# Patient Record
Sex: Female | Born: 1975 | Hispanic: No | Marital: Married | Smoking: Never smoker
Health system: Southern US, Community
[De-identification: ages and names within clinical notes are randomized; demographics above are authoritative.]

## PROBLEM LIST (undated history)

## (undated) ENCOUNTER — Inpatient Hospital Stay (HOSPITAL_COMMUNITY): Payer: Self-pay

## (undated) DIAGNOSIS — N970 Female infertility associated with anovulation: Secondary | ICD-10-CM

## (undated) DIAGNOSIS — K219 Gastro-esophageal reflux disease without esophagitis: Secondary | ICD-10-CM

## (undated) DIAGNOSIS — N926 Irregular menstruation, unspecified: Secondary | ICD-10-CM

## (undated) DIAGNOSIS — O021 Missed abortion: Secondary | ICD-10-CM

## (undated) DIAGNOSIS — Z8619 Personal history of other infectious and parasitic diseases: Secondary | ICD-10-CM

## (undated) DIAGNOSIS — N839 Noninflammatory disorder of ovary, fallopian tube and broad ligament, unspecified: Secondary | ICD-10-CM

## (undated) HISTORY — PX: CYSTECTOMY: SUR359

## (undated) HISTORY — DX: Irregular menstruation, unspecified: N92.6

## (undated) HISTORY — DX: Noninflammatory disorder of ovary, fallopian tube and broad ligament, unspecified: N83.9

## (undated) HISTORY — DX: Female infertility associated with anovulation: N97.0

## (undated) HISTORY — PX: APPENDECTOMY: SHX54

## (undated) HISTORY — DX: Personal history of other infectious and parasitic diseases: Z86.19

---

## 2010-04-11 ENCOUNTER — Emergency Department (HOSPITAL_COMMUNITY): Admission: EM | Admit: 2010-04-11 | Discharge: 2010-04-11 | Payer: Self-pay | Admitting: Emergency Medicine

## 2010-04-29 ENCOUNTER — Ambulatory Visit: Payer: Self-pay | Admitting: Internal Medicine

## 2010-04-30 LAB — CONVERTED CEMR LAB
ALT: 9 units/L (ref 0–35)
AST: 14 units/L (ref 0–37)
Alkaline Phosphatase: 52 units/L (ref 39–117)
BUN: 13 mg/dL (ref 6–23)
Basophils Relative: 0.3 % (ref 0.0–3.0)
CO2: 31 meq/L (ref 19–32)
Calcium: 8.7 mg/dL (ref 8.4–10.5)
Chloride: 106 meq/L (ref 96–112)
Creatinine, Ser: 0.4 mg/dL (ref 0.4–1.2)
Eosinophils Absolute: 0 10*3/uL (ref 0.0–0.7)
HDL: 44.1 mg/dL (ref >39.00)
Leukocytes, UA: NEGATIVE
Lymphocytes Relative: 31.6 % (ref 12.0–46.0)
Monocytes Absolute: 0.5 10*3/uL (ref 0.1–1.0)
Monocytes Relative: 5.5 % (ref 3.0–12.0)
Nitrite: NEGATIVE
RDW: 14.9 % — ABNORMAL HIGH (ref 11.5–14.6)
Specific Gravity, Urine: >=1.03 (ref 1.000–1.030)
Total Bilirubin: 0.6 mg/dL (ref 0.3–1.2)
Total Protein, Urine: NEGATIVE mg/dL
Total Protein: 6.7 g/dL (ref 6.0–8.3)
Urine Glucose: NEGATIVE mg/dL
WBC: 8.6 10*3/uL (ref 4.5–10.5)

## 2010-07-07 ENCOUNTER — Ambulatory Visit (HOSPITAL_COMMUNITY): Admission: RE | Admit: 2010-07-07 | Discharge: 2010-07-07 | Payer: Self-pay | Admitting: Obstetrics and Gynecology

## 2010-07-08 ENCOUNTER — Encounter: Admission: RE | Admit: 2010-07-08 | Discharge: 2010-07-08 | Payer: Self-pay | Admitting: Obstetrics and Gynecology

## 2010-07-09 ENCOUNTER — Ambulatory Visit: Payer: Self-pay | Admitting: Internal Medicine

## 2010-12-29 DIAGNOSIS — O021 Missed abortion: Secondary | ICD-10-CM | POA: Insufficient documentation

## 2010-12-29 HISTORY — DX: Missed abortion: O02.1

## 2011-01-27 NOTE — Assessment & Plan Note (Signed)
Summary: DR AVP PT/NO CLINIC-FACIAL PAIN--STC   Vital Signs:  Patient profile:   35 year old female Height:      61 inches (154.94 cm) Weight:      96.0 pounds (43.64 kg) O2 Sat:      99 % on Room air Temp:     98.7 degrees F (37.06 degrees C) oral Pulse rate:   82 / minute BP sitting:   102 / 72  (left arm) Cuff size:   regular  Vitals Entered By: Orlan Leavens (July 09, 2010 4:00 PM)  O2 Flow:  Room air CC: (L) side facial pain Is Patient Diabetic? No Pain Assessment Patient in pain? yes     Location: (L) side face Type: aching Comments Husband states went to urgent care yesterday for ? fever blister on (L) side lip. was rx Zovirax 5% oint and Valtrex. Today been gaving facial pain (L) side lip now swollen   Primary Care Provider:  Georgina Quint Plotnikov MD  CC:  (L) side facial pain.  History of Present Illness: here with left side lip and face pain - onset 48h ago - seen yesterday& dx with cold sores by urg care  started on cream ointment + 1 pill for same (valtrex x 2 dose) - continued pain and swelling of upper lip - feels nausea with meds - no fevr - no other ulcers or skin lesions +hx cold sores 4 years ago but only lasted 24h - denies physical or emotional stress  Current Medications (verified): 1)  Vitamin D 1000 Unit Tabs (Cholecalciferol) .Marland Kitchen.. 1 By Mouth Qd 2)  Folic Acid 400 Mcg Tabs (Folic Acid) .Marland Kitchen.. 1 By Mouth Qd 3)  Zovirax 5 % Oint (Acyclovir) .... Apply Two Times A Day Prn 4)  Valtrex 1 Gm Tabs (Valacyclovir Hcl) .... Take 1 By Mouth Once Daily  Allergies (verified): No Known Drug Allergies  Past History:  Past Medical History: Palpitations Dr Allyson Sabal   Review of Systems  The patient denies fever, weight loss, chest pain, and syncope.    Physical Exam  General:  thin woman, nonenglish speaking - accompanied by spouse - nontoxic Eyes:  vision grossly intact; pupils equal, round and reactive to light.  conjunctiva and lids normal.    Ears:   normal pinnae bilaterally, without erythema, swelling, or tenderness to palpation. TMs clear, without effusion, or cerumen impaction. Hearing grossly normal bilaterally  Mouth:  +herpetic outbreak and swelling across left side of upper lip; gums and OP WNL Lungs:  normal respiratory effort, no intercostal retractions or use of accessory muscles; normal breath sounds bilaterally - no crackles and no wheezes.    Heart:  normal rate, regular rhythm, no murmur, and no rub. BLE without edema.   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 1 by mouth qd 2)  Folic Acid 400 Mcg Tabs (Folic acid) .Marland Kitchen.. 1 by mouth qd 3)  Zovirax 5 % Oint (Acyclovir) .... Apply two times a day prn 4)  Valtrex 1 Gm Tabs (Valacyclovir hcl) .... Take 1 by mouth once daily x 7 days 5)  Lyrica 25 Mg Caps (Pregabalin) .Marland Kitchen.. 1 by mouth three times a day as needed for pain x 7 days 6)  Promethazine Hcl 12.5 Mg Tabs (Promethazine hcl) .Marland Kitchen.. 1 by mouth every 4 hours as needed for nausea 7)  Lidocaine Hcl 2 % Gel (Lidocaine hcl) .... Apply to affected lip pain three times a day as needed for pain  Patient Instructions: 1)  it was good to see you today. 2)  continue the valtrex once daily x 7 days 3)  use lyrica as needed for pain 4)  use promethazine as needed for nausea 5)  use lidocaine gel as needed to numb the lip pain 6)  ok to continue the acyclovir ointment for the infection as per [facility] instructions - no change 7)  also ok to use tylenol as needed for comfort 8)  Please schedule a follow-up appointment  as needed. Prescriptions: LIDOCAINE HCL 2 % GEL (LIDOCAINE HCL) apply to affected lip pain three times a day as needed for pain  #1 small x 0   Entered and Authorized by:   Newt Lukes MD   Signed by:   Newt Lukes MD on 07/09/2010   Method used:   Print then Give to Patient   RxID:   [id number] PROMETHAZINE HCL 12.5 MG TABS (PROMETHAZINE HCL) 1 by mouth every 4 hours as needed for nausea  #20 x 0   Entered and Authorized by:   Newt Lukes MD   Signed by:   Newt Lukes MD on 07/09/2010   Method used:   Print then Give to Patient   RxID:   [id number] LYRICA 25 MG CAPS (PREGABALIN) 1 by mouth three times a day as needed for pain x 7 days  #21 x 0   Entered and Authorized by:   Newt Lukes MD   Signed by:   Newt Lukes MD on 07/09/2010   Method used:   Print then Give to Patient   RxID:   [id number] VALTREX 1 GM TABS (VALACYCLOVIR HCL) take 1 by mouth once daily x 7 days  #7 x 0   Entered and Authorized by:   Newt Lukes MD   Signed by:   Newt Lukes MD on 07/09/2010   Method used:   Print then Give to Patient   RxID:   [id number]

## 2011-01-27 NOTE — Assessment & Plan Note (Signed)
Summary: NEW / [employer]/ #/ CD    Vital Signs:  Patient profile:   35 year old female Height:      61 inches Weight:      93 pounds (42.27 kg) BMI:     17.64 O2 Sat:      98 % on Room air Temp:     98.8 degrees F (37.11 degrees C) oral Pulse rate:   81 / minute Pulse rhythm:   regular BP sitting:   118 / 78  (left arm) Cuff size:   regular  Vitals Entered By: Brenton Grills (Apr 29, 2010 3:09 PM)  O2 Flow:  Room air CC: new pt here to establish care/aj   CC:  new pt here to establish care/aj.  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 Never Smoked Alcohol use-no Regular exercise-no Smoking Status:  never Does Patient Exercise:  no  Review of Systems  The patient denies anorexia, fever, weight loss, weight gain, vision loss, decreased hearing, hoarseness, chest pain, syncope, dyspnea on exertion, peripheral edema, prolonged cough, headaches, hemoptysis, abdominal pain, melena, hematochezia, severe indigestion/heartburn, hematuria, incontinence, genital sores, muscle weakness, suspicious skin lesions, transient blindness, difficulty walking, depression, unusual weight change, abnormal bleeding, enlarged lymph nodes, angioedema, and breast masses.    Physical Exam  General:  Well-developed,well-nourished,in no acute distress; alert,appropriate and cooperative throughout examination Head:  Normocephalic and atraumatic without obvious abnormalities. No apparent alopecia or balding. Eyes:  No corneal  or conjunctival inflammation noted. EOMI. Perrla. Funduscopic exam benign, without hemorrhages, exudates or papilledema. Vision grossly normal. Ears:  External ear exam shows no significant lesions or deformities.  Otoscopic examination reveals clear canals, tympanic membranes are intact bilaterally without bulging, retraction, inflammation or discharge. Hearing is grossly normal bilaterally. Nose:  External nasal examination shows no deformity or inflammation. Nasal mucosa are pink and moist without lesions or exudates. Mouth:  Oral mucosa and oropharynx without lesions or exudates.  Teeth in good repair. Neck:  No deformities, masses, or tenderness noted. Lungs:  Normal respiratory effort, chest expands symmetrically. Lungs are clear to auscultation, no crackles or wheezes. Heart:  Normal rate and regular rhythm. S1 and S2 normal without gallop, murmur, click, rub or other extra sounds. Abdomen:  Bowel sounds positive,abdomen soft and non-tender without masses, organomegaly or hernias noted. Msk:  No deformity or scoliosis noted of thoracic or lumbar spine.   Pulses:  R and L carotid,radial,femoral,dorsalis pedis and posterior tibial pulses are full and equal bilaterally Extremities:  No clubbing, cyanosis, edema, or deformity noted with normal full range of motion of all joints.   Neurologic:  No cranial nerve deficits noted. Station and gait are normal. Plantar reflexes are down-going bilaterally. DTRs are symmetrical throughout. Sensory, motor and coordinative functions appear intact. Skin:  Intact without suspicious lesions or rashes Cervical Nodes:  No lymphadenopathy noted Inguinal Nodes:  No significant adenopathy Psych:  Cognition and judgment appear intact. Alert and cooperative with normal attention span and concentration. No apparent delusions, illusions, hallucinations   Impression & Recommendations:  Problem #  1:  PHYSICAL EXAMINATION (ICD-V70.0) Assessment New Health and age  related issues were discussed. Available screening tests and vaccinations were discussed as well. Healthy life style including good diet and execise was discussed.  Orders: Gynecologic Referral (Gyn) TLB-B12, Serum-Total ONLY (45409-W11) TLB-BMP (Basic Metabolic Panel-BMET) (80048-METABOL) TLB-CBC Platelet - w/Differential (85025-CBCD) TLB-Hepatic/Liver Function Pnl (80076-HEPATIC) TLB-Lipid Panel (80061-LIPID) TLB-TSH (Thyroid Stimulating Hormone) (84443-TSH) TLB-Udip ONLY (81003-UDIP)  Problem # 2:  PALPITATIONS (ICD-785.1) Assessment: New She is wearing a monitor. F/u w/Dr Allyson Sabal is pending  Orders: TLB-B12, Serum-Total ONLY (91478-G95) TLB-BMP (Basic Metabolic Panel-BMET) (80048-METABOL) TLB-CBC Platelet - w/Differential (85025-CBCD) TLB-Hepatic/Liver Function Pnl (80076-HEPATIC) TLB-Lipid Panel (80061-LIPID) TLB-TSH (Thyroid Stimulating Hormone) (84443-TSH) TLB-Udip ONLY (81003-UDIP)  Complete Medication List: 1)  Vitamin D 1000 Unit Tabs (Cholecalciferol) .Marland Kitchen.. 1 by mouth qd 2)  Folic Acid 400 Mcg Tabs (Folic acid) .Marland Kitchen.. 1 by mouth qd  Patient Instructions: 1)  Please schedule a follow-up appointment in 1 year. Prescriptions: FOLIC ACID 400 MCG TABS (FOLIC ACID) 1 by mouth qd  #621 x 0   Entered and Authorized by:   Tresa Garter MD   Signed by:   Tresa Garter MD on 05/03/2010   Method used:   Print then Give to Patient   RxID:   [id number]

## 2011-03-17 LAB — DIFFERENTIAL
Basophils Relative: 0 % (ref 0–1)
Lymphocytes Relative: 32 % (ref 12–46)
Monocytes Absolute: 0.5 10*3/uL (ref 0.1–1.0)
Neutro Abs: 5.1 10*3/uL (ref 1.7–7.7)
Neutrophils Relative %: 61 % (ref 43–77)

## 2011-03-17 LAB — POCT CARDIAC MARKERS: CKMB, poc: <1 ng/mL — ABNORMAL LOW (ref 1.0–8.0)

## 2011-03-17 LAB — CBC
HCT: 39.5 % (ref 36.0–46.0)
Platelets: 180 10*3/uL (ref 150–400)
RDW: 14.7 % (ref 11.5–15.5)

## 2011-03-17 LAB — COMPREHENSIVE METABOLIC PANEL
AST: 15 U/L (ref 0–37)
CO2: 27 mEq/L (ref 19–32)
Calcium: 8.7 mg/dL (ref 8.4–10.5)
Chloride: 107 mEq/L (ref 96–112)
GFR calc Af Amer: >60 mL/min (ref >60)
Potassium: 4 mEq/L (ref 3.5–5.1)
Total Protein: 6.8 g/dL (ref 6.0–8.3)

## 2011-05-21 DIAGNOSIS — N839 Noninflammatory disorder of ovary, fallopian tube and broad ligament, unspecified: Secondary | ICD-10-CM

## 2011-05-21 HISTORY — DX: Noninflammatory disorder of ovary, fallopian tube and broad ligament, unspecified: N83.9

## 2012-03-18 ENCOUNTER — Ambulatory Visit (INDEPENDENT_AMBULATORY_CARE_PROVIDER_SITE_OTHER): Payer: 59 | Admitting: Internal Medicine

## 2012-03-18 ENCOUNTER — Encounter: Payer: Self-pay | Admitting: Internal Medicine

## 2012-03-18 ENCOUNTER — Other Ambulatory Visit (INDEPENDENT_AMBULATORY_CARE_PROVIDER_SITE_OTHER): Payer: 59

## 2012-03-18 VITALS — BP 110/70 | HR 88 | Temp 98.5°F | Resp 16 | Wt 105.0 lb

## 2012-03-18 DIAGNOSIS — R202 Paresthesia of skin: Secondary | ICD-10-CM

## 2012-03-18 DIAGNOSIS — E559 Vitamin D deficiency, unspecified: Secondary | ICD-10-CM

## 2012-03-18 DIAGNOSIS — R209 Unspecified disturbances of skin sensation: Secondary | ICD-10-CM

## 2012-03-18 DIAGNOSIS — R109 Unspecified abdominal pain: Secondary | ICD-10-CM

## 2012-03-18 DIAGNOSIS — M545 Low back pain: Secondary | ICD-10-CM

## 2012-03-18 DIAGNOSIS — N926 Irregular menstruation, unspecified: Secondary | ICD-10-CM

## 2012-03-18 LAB — COMPREHENSIVE METABOLIC PANEL
Albumin: 3.8 g/dL (ref 3.5–5.2)
BUN: 13 mg/dL (ref 6–23)
CO2: 30 mEq/L (ref 19–32)
Chloride: 100 mEq/L (ref 96–112)
GFR: 186.34 mL/min (ref >60.00)
Glucose, Bld: 86 mg/dL (ref 70–99)
Potassium: 3.6 mEq/L (ref 3.5–5.1)
Sodium: 137 mEq/L (ref 135–145)

## 2012-03-18 LAB — TSH: TSH: 1.36 u[IU]/mL (ref 0.35–5.50)

## 2012-03-18 LAB — CBC WITH DIFFERENTIAL/PLATELET
HCT: 37.6 % (ref 36.0–46.0)
Hemoglobin: 12.4 g/dL (ref 12.0–15.0)
Monocytes Absolute: 0.4 10*3/uL (ref 0.1–1.0)
Neutro Abs: 5 10*3/uL (ref 1.4–7.7)
Neutrophils Relative %: 60.9 % (ref 43.0–77.0)
Platelets: 244 10*3/uL (ref 150.0–400.0)
RDW: 13.7 % (ref 11.5–14.6)
WBC: 8.1 10*3/uL (ref 4.5–10.5)

## 2012-03-18 MED ORDER — IBUPROFEN 600 MG PO TABS: ORAL_TABLET | ORAL | Status: AC

## 2012-03-18 MED ORDER — PRENATAL RX 60-1 MG PO TABS: 1.0000 | ORAL_TABLET | Freq: Every day | ORAL | Status: DC

## 2012-03-18 NOTE — Progress Notes (Signed)
  Subjective:    Patient ID: Gabriela Le, female    DOB: [birth year], 36 y.o.   MRN: [id number]  HPI  C/o LBP irrad in B legs, chronic - worse with weather changes C/o mild stiffness LMP 10 d ago - came early  Review of Systems  Constitutional: Negative for fever, chills, activity change, appetite change, fatigue and unexpected weight change.  HENT: Negative for congestion, mouth sores and sinus pressure.   Eyes: Negative for visual disturbance.  Respiratory: Negative for cough and chest tightness.   Gastrointestinal: Negative for nausea and abdominal pain.  Genitourinary: Negative for frequency, difficulty urinating and vaginal pain.  Musculoskeletal: Positive for back pain and arthralgias. Negative for gait problem.  Skin: Negative for pallor and rash.  Neurological: Negative for dizziness, tremors, weakness, numbness and headaches.  Hematological: Does not bruise/bleed easily.  Psychiatric/Behavioral: Negative for suicidal ideas, confusion and sleep disturbance. The patient is not nervous/anxious.        Objective:   Physical Exam  Constitutional: She appears well-developed and well-nourished. No distress.  HENT:  Head: Normocephalic.  Right Ear: External ear normal.  Left Ear: External ear normal.  Nose: Nose normal.  Mouth/Throat: Oropharynx is clear and moist.  Eyes: Conjunctivae are normal. Pupils are equal, round, and reactive to light. Right eye exhibits no discharge. Left eye exhibits no discharge.  Neck: Normal range of motion. Neck supple. No JVD present. No tracheal deviation present. No thyromegaly present.  Cardiovascular: Normal rate, regular rhythm and normal heart sounds.   Pulmonary/Chest: No stridor. No respiratory distress. She has no wheezes.  Abdominal: Soft. Bowel sounds are normal. She exhibits no distension and no mass. There is no tenderness. There is no rebound and no guarding.  Musculoskeletal: She exhibits no edema and no tenderness.    Lymphadenopathy:    She has no cervical adenopathy.  Neurological: She displays normal reflexes. No cranial nerve deficit. She exhibits normal muscle tone. Coordination normal.  Skin: No rash noted. She is not diaphoretic. No erythema.  Psychiatric: She has a normal mood and affect. Her behavior is normal. Judgment and thought content normal.    Lab Results  Component Value Date   WBC 8.1 03/18/2012   HGB 12.4 03/18/2012   HCT 37.6 03/18/2012   PLT 244.0 03/18/2012   GLUCOSE 86 03/18/2012   CHOL 169 04/29/2010   TRIG 163.0* 04/29/2010   HDL 44.10 04/29/2010   LDLCALC 92 04/29/2010   ALT 8 03/18/2012   AST 13 03/18/2012   NA 137 03/18/2012   K 3.6 03/18/2012   CL 100 03/18/2012   CREATININE 0.4 03/18/2012   BUN 13 03/18/2012   CO2 30 03/18/2012   TSH 1.36 03/18/2012         Assessment & Plan:

## 2012-03-19 ENCOUNTER — Encounter: Payer: Self-pay | Admitting: Internal Medicine

## 2012-03-19 ENCOUNTER — Telehealth: Payer: Self-pay | Admitting: Internal Medicine

## 2012-03-19 LAB — RHEUMATOID FACTOR: Rhuematoid fact SerPl-aCnc: <10 IU/mL (ref <=14)

## 2012-03-19 MED ORDER — ERGOCALCIFEROL 1.25 MG (50000 UT) PO CAPS: 50000.0000 [IU] | ORAL_CAPSULE | ORAL | Status: DC

## 2012-03-19 MED ORDER — VITAMIN D 1000 UNITS PO TABS: 1000.0000 [IU] | ORAL_TABLET | Freq: Every day | ORAL | Status: DC

## 2012-03-19 NOTE — Assessment & Plan Note (Signed)
Check B12 

## 2012-03-19 NOTE — Assessment & Plan Note (Signed)
Check ser pregn test

## 2012-03-19 NOTE — Assessment & Plan Note (Signed)
Start on Rx 

## 2012-03-19 NOTE — Assessment & Plan Note (Signed)
Labs Xray if not pregnant

## 2012-03-19 NOTE — Telephone Encounter (Signed)
Gabriela Le, please, inform patient that all labs are normal except for low vit D Start Vit D Rx, then OTC tabs Pregn test was negative Thx

## 2012-03-21 LAB — ANA: Anti Nuclear Antibody(ANA): NEGATIVE

## 2012-03-21 NOTE — Telephone Encounter (Signed)
Left mess for patient to call back.  

## 2012-03-29 NOTE — Telephone Encounter (Signed)
Pt's husband Lazarus Gowda informed all labs are normal except Vit d is low.

## 2012-04-12 ENCOUNTER — Encounter: Payer: Self-pay | Admitting: Obstetrics and Gynecology

## 2012-04-12 ENCOUNTER — Ambulatory Visit (INDEPENDENT_AMBULATORY_CARE_PROVIDER_SITE_OTHER): Payer: 59 | Admitting: Obstetrics and Gynecology

## 2012-04-12 VITALS — BP 90/66 | Resp 16 | Ht 62.0 in | Wt 105.0 lb

## 2012-04-12 DIAGNOSIS — N971 Female infertility of tubal origin: Secondary | ICD-10-CM

## 2012-04-12 DIAGNOSIS — Z01419 Encounter for gynecological examination (general) (routine) without abnormal findings: Secondary | ICD-10-CM

## 2012-04-12 DIAGNOSIS — N970 Female infertility associated with anovulation: Secondary | ICD-10-CM

## 2012-04-12 DIAGNOSIS — N979 Female infertility, unspecified: Secondary | ICD-10-CM

## 2012-04-12 NOTE — Progress Notes (Deleted)
Subjective:    Gabriela Le is a 36 y.o. female No obstetric history on file. who presents for evaluation of infertility. Patient and partner have been attempting conception since {MONTH:22386} 20***. Partner: *** is *** years old {does/does not:200015} have children. Couple has been attempting conception since {MONTH:22386} 20***. Pregnancies with current partner: {yes/no:311178::"***"}.  Menstrual history:   LMP:  Patient's last menstrual period was 04/06/2012. Menarche:  *** years old Menstrual cycle:  {Misc; menses description:16152} Pelvic pain: {diagnoses; pelvic pain:12438}  Endocrine history:   Basal body temperature Biphasic {yes/no:311178}  TSH {Desc; normal/abnormal w/wildcard:19060} {MONTH:22386} 20***  Prolactin {Desc; normal/abnormal w/wildcard:19060} {MONTH:22386} 20***  FSH {Desc; normal/abnormal w/wildcard:19060} {MONTH:22386} 20***  Day 21 progesterone ***  {MONTH:22386} 20***  Hysterosalpyngogram {Desc; normal/abnormal w/wildcard:19060} {MONTH:22386} 20***  Glucose:Insulin ratio {Desc; normal/abnormal w/wildcard:19060} {MONTH:22386} 20***  Semen analysis {Desc; normal/abnormal w/wildcard:19060}{MONTH:22386} 20***  Medications {meds; infertility:14695}  Other therapies {infertility art procedures:14668}  Insemination {response; UUV:25366}   Obstetrical history:   {infertility hx:14818}  Contraception history:   {infertility contraceptive types:14820}   Habits:   Patient reports:  History   Social History  . Marital Status: Married    Spouse Name: N/A    Number of Children: N/A  . Years of Education: N/A   Social History Main Topics  . Smoking status: Never Smoker   . Smokeless tobacco: Never Used  . Alcohol Use: No  . Drug Use: No  . Sexually Active: Yes   Other Topics Concern  . None   Social History Narrative  . None   Partner reports: {findings; intake alcohol/tobacco/caffeine:30392}  The following portions of the patient's history  were reviewed and updated as appropriate: allergies, current medications, past family history, past medical history, past social history, past surgical history and problem list.  Review of Systems {ros; complete:30496}   Objective:     BP 90/66  Resp 16  Ht 5\' 2"  (1.575 m)  Wt 105 lb (47.628 kg)  BMI 19.20 kg/m2  LMP 04/06/2012   Wt Readings from Last 1 Encounters:  04/12/12 105 lb (47.628 kg)    BMI: Body mass index is 19.20 kg/(m^2). General Appearance: Alert, appropriate appearance for age. No acute distress HEENT: Grossly normal Neck / Thyroid: Supple, no masses, nodes or enlargement Lungs: clear to auscultation bilaterally Back: No CVA tenderness Breast Exam: {exam; breast:30839::"No masses or nodes.No dimpling, nipple retraction or discharge."} Cardiovascular: Regular rate and rhythm. S1, S2, no murmur Gastrointestinal: Soft, non-tender, no masses or organomegaly Pelvic Exam: External genitalia: female circumcision and REMOVAL OF MOST OF LABIA Urinary system: meatus covered by circumcison closure Vaginal: {vaginal exam:30846} Cervix: {cervix exam:30847} Adnexa: {pelvic adnexa exam:30848} Uterus: retroverted and with decreased mobility Rectal: good sphincter tone and no masses  Rectovaginal: {rectal female:311646::"not indicated"} Lymphatic Exam: Non-palpable nodes in neck, clavicular, axillary, or inguinal regions Skin: no rash or abnormalities Neurologic: Normal gait and speech, no tremor  Psychiatric: Alert and oriented, appropriate affect.    Assessment:    {repro-endo rfv:14670::"Female infertility"} Suspected / known {infertility:18127}.   Plan:   Tests ordered:   ***  Treatment:   {meds; infertility:14695} {With-without:32421} hCG  {With-without:32421} follicle study Follow-up:  {follow up:15908}  Mckinzy Fuller PMD 4/16/20134:25 PM

## 2012-04-13 LAB — PAP IG AND HPV HIGH-RISK

## 2012-04-13 NOTE — Progress Notes (Signed)
Subjective:    Gabriela Le is a 36 y.o. female, No obstetric history on file., who presents for an annual exam. And with the continued complaint of infertility.  The patient has a HSG consistent with left tubal occlusion.  Her menses has changed to span only a single day of bleeding     History   Social History  . Marital Status: Married    Spouse Name: N/A    Number of Children: N/A  . Years of Education: N/A   Social History Main Topics  . Smoking status: Never Smoker   . Smokeless tobacco: Never Used  . Alcohol Use: No  . Drug Use: No  . Sexually Active: Yes   Other Topics Concern  . None   Social History Narrative  . None    Menstrual cycle:   LMP: Patient's last menstrual period was 04/06/2012.           Cycle: Monthly  The following portions of the patient's history were reviewed and updated as appropriate: allergies, current medications, past family history, past medical history, past social history, past surgical history and problem list.  Review of Systems Pertinent items are noted in HPI. Breast:Negative for breast lump,nipple discharge or nipple retraction Gastrointestinal: Negative for abdominal pain, change in bowel habits or rectal bleeding Urinary:negative   Objective:    BP 90/66  Resp 16  Ht 5\' 2"  (1.575 m)  Wt 105 lb (47.628 kg)  BMI 19.20 kg/m2  LMP 04/06/2012    Weight:  Wt Readings from Last 1 Encounters:  04/12/12 105 lb (47.628 kg)          BMI: Body mass index is 19.20 kg/(m^2).  General Appearance: Alert, appropriate appearance for age. No acute distress HEENT: Grossly normal Neck / Thyroid: Supple, no masses, nodes or enlargement Lungs: clear to auscultation bilaterally Back: No CVA tenderness Breast Exam: No masses or nodes.No dimpling, nipple retraction or discharge. Cardiovascular: Regular rate and rhythm. S1, S2, no murmur Gastrointestinal: Soft, non-tender, no masses or organomegaly Pelvic Exam: External genitalia: female  circumcision Urinary system: urethral meatus obscured by circumcision Vaginal: normal mucosa without prolapse or lesions Cervix: normal appearance Adnexa: normal bimanual exam Uterus: nl sized, mobile and nontender Rectovaginal: normal rectal, no masses Lymphatic Exam: Non-palpable nodes in neck, clavicular, axillary, or inguinal regions Skin: no rash or abnormalities Neurologic: Normal gait and speech, no tremor  Psychiatric: Alert and oriented, appropriate affect.   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 April Manson STD screening: done previously Contraception:none, trying to conceive FU PRN      Alexander Mcauley PMD

## 2012-04-15 ENCOUNTER — Other Ambulatory Visit: Payer: Self-pay

## 2012-04-15 DIAGNOSIS — N979 Female infertility, unspecified: Secondary | ICD-10-CM

## 2012-04-18 ENCOUNTER — Telehealth: Payer: Self-pay

## 2012-04-18 NOTE — Telephone Encounter (Signed)
LM ON VM TO CB PER REFERRAL APPT.

## 2012-04-19 NOTE — Telephone Encounter (Signed)
PC TO PT RGDG REFERRAL. SPOKE WIT PT'S HUSBAND. APPT SCHED 06/06/12@ 10:30A WITH DR.YALCINKAYA. PT'S HUSBAND VOICES UNDERSTANDING.

## 2012-05-02 ENCOUNTER — Ambulatory Visit: Payer: 59 | Admitting: Internal Medicine

## 2012-05-13 ENCOUNTER — Telehealth: Payer: Self-pay | Admitting: Obstetrics and Gynecology

## 2012-05-13 NOTE — Telephone Encounter (Signed)
TC to pt. Spoke with husband. States had +UPT a few days ago.  LMP 04/06/12. Will notify Dr [facility] and call with recommendations.  Husband is agreeable.

## 2012-05-13 NOTE — Telephone Encounter (Signed)
Message copied by Mason Jim on Fri May 13, 2012  3:19 PM ------      Message from: ELDER, [place] M      Created: Fri May 13, 2012  1:18 PM      Regarding: positive preg test      Contact: [phone]       She was told to call when pregnancy positive, per chandra placing calling in triage pool. pe

## 2012-05-16 ENCOUNTER — Other Ambulatory Visit: Payer: 59

## 2012-05-16 DIAGNOSIS — N979 Female infertility, unspecified: Secondary | ICD-10-CM

## 2012-05-16 MED ORDER — PROMETHAZINE HCL 25 MG PO TABS: 25.0000 mg | ORAL_TABLET | Freq: Four times a day (QID) | ORAL | Status: DC | PRN

## 2012-05-17 LAB — HCG, QUANTITATIVE, PREGNANCY: hCG, Beta Chain, Quant, S: 110173.2 m[IU]/mL

## 2012-05-18 ENCOUNTER — Other Ambulatory Visit: Payer: 59

## 2012-05-18 DIAGNOSIS — N979 Female infertility, unspecified: Secondary | ICD-10-CM

## 2012-05-18 LAB — HCG, QUANTITATIVE, PREGNANCY: hCG, Beta Chain, Quant, S: 142883 m[IU]/mL

## 2012-05-26 ENCOUNTER — Ambulatory Visit (INDEPENDENT_AMBULATORY_CARE_PROVIDER_SITE_OTHER): Payer: 59

## 2012-05-26 ENCOUNTER — Other Ambulatory Visit: Payer: Self-pay | Admitting: Obstetrics and Gynecology

## 2012-05-26 ENCOUNTER — Ambulatory Visit (INDEPENDENT_AMBULATORY_CARE_PROVIDER_SITE_OTHER): Payer: 59 | Admitting: Obstetrics and Gynecology

## 2012-05-26 ENCOUNTER — Encounter: Payer: Self-pay | Admitting: Obstetrics and Gynecology

## 2012-05-26 VITALS — BP 102/62 | Ht 62.0 in | Wt 101.0 lb

## 2012-05-26 DIAGNOSIS — N979 Female infertility, unspecified: Secondary | ICD-10-CM

## 2012-05-26 DIAGNOSIS — K117 Disturbances of salivary secretion: Secondary | ICD-10-CM

## 2012-05-26 DIAGNOSIS — O210 Mild hyperemesis gravidarum: Secondary | ICD-10-CM

## 2012-05-26 DIAGNOSIS — Z8742 Personal history of other diseases of the female genital tract: Secondary | ICD-10-CM

## 2012-05-26 DIAGNOSIS — O219 Vomiting of pregnancy, unspecified: Secondary | ICD-10-CM

## 2012-05-26 DIAGNOSIS — Z8759 Personal history of other complications of pregnancy, childbirth and the puerperium: Secondary | ICD-10-CM

## 2012-05-26 DIAGNOSIS — B009 Herpesviral infection, unspecified: Secondary | ICD-10-CM

## 2012-05-26 LAB — US OB TRANSVAGINAL

## 2012-05-26 MED ORDER — ONDANSETRON HCL 8 MG PO TABS: 8.0000 mg | ORAL_TABLET | Freq: Three times a day (TID) | ORAL | Status: AC | PRN

## 2012-05-26 MED ORDER — PROGESTERONE MICRONIZED 100 MG PO CAPS: ORAL_CAPSULE | ORAL | Status: DC

## 2012-05-26 MED ORDER — POLYETHYLENE GLYCOL 3350 17 G PO PACK: 17.0000 g | PACK | Freq: Every day | ORAL | Status: AC

## 2012-05-26 MED ORDER — ACYCLOVIR 5 % EX OINT: TOPICAL_OINTMENT | CUTANEOUS | Status: DC

## 2012-05-26 MED ORDER — DICYCLOMINE HCL 10 MG PO CAPS: 10.0000 mg | ORAL_CAPSULE | Freq: Three times a day (TID) | ORAL | Status: DC

## 2012-05-26 NOTE — Progress Notes (Signed)
Pt c/o pain in the top of her stomach. Also c/o bump on her lip, lots of nausea and a knot under her chin.  [place]; Ultrasound:  fetal cardiac activity, CRL: 1.52 cm, EGA:7 weeks +6 days, S=D C/o: 1) Nausea and vomiting.  4 llb wt loss since visit in April.  Also c/o ptyalism   Recommend: Bentyl     Zofran and Miralax (already has constipation)  2) Hx SAB.    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

## 2012-05-26 NOTE — Telephone Encounter (Signed)
Gabriela Le/epic 

## 2012-05-27 NOTE — Telephone Encounter (Signed)
Pc to pharm per telephone call. Pharm states,"no questions at this time. Figured out what needed to be done yesterday". Pharm agrees.

## 2012-06-03 ENCOUNTER — Telehealth: Payer: Self-pay | Admitting: Obstetrics and Gynecology

## 2012-06-03 NOTE — Telephone Encounter (Signed)
Kendal Hymen speaking with pt

## 2012-06-03 NOTE — Telephone Encounter (Signed)
Triage/epic 

## 2012-06-03 NOTE — Telephone Encounter (Signed)
vph pt 

## 2012-06-03 NOTE — Telephone Encounter (Signed)
Spoke with py's husband rgd his wife still feeling sick with zofran 8 mg tabs and penergan25mg  . Per vl told me to offer pt phenergan 25 mg supp.or come to [place]. Pt did not answer the phone called 3 times . Lm on vm and told pt i would call back. If no answer next time i call i will advise pt to call midwife oncall

## 2012-06-03 NOTE — Telephone Encounter (Signed)
Lm on vm to cb per telephone call.  

## 2012-06-09 ENCOUNTER — Telehealth: Payer: Self-pay | Admitting: Obstetrics and Gynecology

## 2012-06-09 NOTE — Telephone Encounter (Signed)
Left msg  For a f/u on pt's voice mail to call back rgd our conversation from the other day.

## 2012-06-09 NOTE — Telephone Encounter (Signed)
Tyvonna/return call 

## 2012-06-10 ENCOUNTER — Telehealth: Payer: Self-pay | Admitting: Obstetrics and Gynecology

## 2012-06-10 NOTE — Telephone Encounter (Signed)
Left 2 nd msg on pt's voice mail trying to do a f/u on pt rgd msg from last week. bt cma

## 2012-06-10 NOTE — Telephone Encounter (Signed)
Left 2nd msg on pt's voice mail.

## 2012-06-10 NOTE — Telephone Encounter (Signed)
Spoke with pt's husband today rgd F/u from last week . Told pt's husband tried to cb last Friday rgd msg. Pt's husband stated that she is still taking her phenergan 25 mg not the zofran 8 mg no more. Pt's husband said that his wife is still feeling nausea advised husband for wife to eat small portions of food, and pt can eat some crackers.told pt's husband to see how his wife feels over the weekend and to call me back next week as needed the patient's husband also asked about prenatal vitamins i told pt's husband she will get some samples at her nob work-up on 06/20/2012. Pt's husband voice understanding . BTCMA

## 2012-06-10 NOTE — Telephone Encounter (Signed)
Spoke with pt's husband today he stated that he is still working to call him back @ 3:00 btcma

## 2012-06-20 ENCOUNTER — Ambulatory Visit (INDEPENDENT_AMBULATORY_CARE_PROVIDER_SITE_OTHER): Payer: 59 | Admitting: Obstetrics and Gynecology

## 2012-06-20 VITALS — Wt 100.0 lb

## 2012-06-20 DIAGNOSIS — Z331 Pregnant state, incidental: Secondary | ICD-10-CM

## 2012-06-20 DIAGNOSIS — Z3201 Encounter for pregnancy test, result positive: Secondary | ICD-10-CM

## 2012-06-20 LAB — POCT URINALYSIS DIPSTICK
Bilirubin, UA: NEGATIVE
Blood, UA: 2
Nitrite, UA: NEGATIVE
Protein, UA: NEGATIVE
Urobilinogen, UA: NEGATIVE
pH, UA: 6

## 2012-06-20 NOTE — Progress Notes (Signed)
PT REQUESTS WT CHECK TODAY.

## 2012-06-21 LAB — CULTURE, OB URINE
Colony Count: NO GROWTH
Organism ID, Bacteria: NO GROWTH

## 2012-06-21 LAB — PRENATAL PANEL VII
Basophils Absolute: 0 10*3/uL (ref 0.0–0.1)
HIV: NONREACTIVE
Hepatitis B Surface Ag: NEGATIVE
Lymphocytes Relative: 25 % (ref 12–46)
Lymphs Abs: 2.5 10*3/uL (ref 0.7–4.0)
MCV: 88.8 fL (ref 78.0–100.0)
Neutro Abs: 6.7 10*3/uL (ref 1.7–7.7)
Platelets: 227 10*3/uL (ref 150–400)
RBC: 3.85 MIL/uL — ABNORMAL LOW (ref 3.87–5.11)
RDW: 14.4 % (ref 11.5–15.5)
Rubella: >500 IU/mL — ABNORMAL HIGH
WBC: 9.7 10*3/uL (ref 4.0–10.5)

## 2012-06-22 LAB — HEMOGLOBINOPATHY EVALUATION
Hemoglobin Other: 0 %
Hgb F Quant: 0 % (ref 0.0–2.0)
Hgb S Quant: 0 %

## 2012-07-14 ENCOUNTER — Ambulatory Visit (INDEPENDENT_AMBULATORY_CARE_PROVIDER_SITE_OTHER): Payer: 59 | Admitting: Obstetrics and Gynecology

## 2012-07-14 ENCOUNTER — Encounter: Payer: Self-pay | Admitting: Obstetrics and Gynecology

## 2012-07-14 VITALS — BP 90/58 | Wt 101.0 lb

## 2012-07-14 DIAGNOSIS — K219 Gastro-esophageal reflux disease without esophagitis: Secondary | ICD-10-CM

## 2012-07-14 DIAGNOSIS — N90810 Female genital mutilation status, unspecified: Secondary | ICD-10-CM

## 2012-07-14 DIAGNOSIS — Z331 Pregnant state, incidental: Secondary | ICD-10-CM

## 2012-07-14 MED ORDER — OMEPRAZOLE 20 MG PO CPDR: 20.0000 mg | DELAYED_RELEASE_CAPSULE | Freq: Every day | ORAL | Status: DC

## 2012-07-14 NOTE — Progress Notes (Signed)
[redacted]w[redacted]d Subjective:    Gabriela Le is being seen today for her first obstetrical visit at [redacted]w[redacted]d gestation by42w1d   .  She reports nausea associated with acid reflux after eating. Also reports constipation.  Her obstetrical history is significant for: hx of SAB 2011 at approx 6 weeks. Passed POC - no D&E Patient Active Problem List  Diagnosis  . HERPES LABIALIS  . PALPITATIONS  . Low back pain radiating to both legs  . Irregular periods  . Paresthesia  . Abdominal  pain, other specified site  . Vitamin d deficiency  . 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 Infertility, female  . HSV-1 infection  . Nausea and vomiting in pregnancy prior to [redacted] weeks gestation  . Ptyalism    Relationship with FOB: married , stable relationship.  Patient  intends to breast feed.   Pregnancy history fully reviewed. Medical Hx: Hx of palpations when she arrived in [place] 3 years ago. Attributed to anxiety. Had them investigated - resolved spontaneously.  Review of Systems Pertinent ROS is described in HPI   Objective:   BP 90/58  Wt 101 lb (45.813 kg)  LMP 04/06/2012 Wt Readings from Last 1 Encounters:  07/14/12 101 lb (45.813 kg)   BMI: 18.47  General: alert, cooperative and no distress Respiratory: clear to auscultation bilaterally Cardiovascular: regular rate and rhythm, S1, S2 normal, no murmur Breasts:  No dominant masses, nipples erect Gastrointestinal: soft, non-tender; no masses,  no organomegaly, complains of constipation, and acid reflux  (Will prescribe for these conditions)  Extremities: extremities normal, no pain or edema Vaginal Bleeding: None  EXTERNAL GENITALIA: FEMALE CIRCUMCISION,no tenderness or lesions VAGINA: no abnormal discharge or lesions CERVIX: no lesions or cervical motion tenderness; cervix closed, long, firm UTERUS: gravid and consistent with 101w1d weeks ADNEXA: no masses palpable and nontender OB EXAM PELVIMETRY: adequate   FHR:  135bpm  Assessment:    Pregnancy at  [redacted]w[redacted]d Female Circumcision C/o constipation - Tx Colace C/o Acid Reflux - Tx Ranitidine    Plan:    Prenatal panel: done today 07/14/12 Pap smear collected:donetoday 07/14/12 (with difficulty due to female circ) GC/Chlamydia collected:done today 07/14/12 Wet prep: PH 4.5 neg Discussion of Genetic testing options: [facility] today 07/14/12 Prenatal vitamins recommended - taking at present Problem list reviewed and updated.  Plan of care: Follow up in 4 weeks for ROB. Anatomy USS at 19 weeks  Acid Reflux: Ranitidine 20mg s po daily Constipation: Colace 100mg s po daily   Earl Gala CNM, [place] 07/14/2012 3:27 PM

## 2012-07-14 NOTE — Progress Notes (Deleted)
Pt experiencing constipation

## 2012-07-15 LAB — PRENATAL PANEL VII
Antibody Screen: NEGATIVE
Eosinophils Relative: 0 % (ref 0–5)
Hemoglobin: 11.9 g/dL — ABNORMAL LOW (ref 12.0–15.0)
Hepatitis B Surface Ag: NEGATIVE
Lymphocytes Relative: 23 % (ref 12–46)
Lymphs Abs: 2.3 10*3/uL (ref 0.7–4.0)
MCV: 90.8 fL (ref 78.0–100.0)
Monocytes Relative: 4 % (ref 3–12)
Platelets: 212 10*3/uL (ref 150–400)
RBC: 3.82 MIL/uL — ABNORMAL LOW (ref 3.87–5.11)
Rubella: >500 IU/mL — ABNORMAL HIGH
WBC: 10.3 10*3/uL (ref 4.0–10.5)

## 2012-07-18 LAB — HEMOGLOBINOPATHY EVALUATION
Hemoglobin Other: 0 %
Hgb S Quant: 0 %

## 2012-07-18 LAB — PAP IG, CT-NG, RFX HPV ASCU

## 2012-07-19 ENCOUNTER — Encounter: Payer: 59 | Admitting: Obstetrics and Gynecology

## 2012-07-25 ENCOUNTER — Encounter: Payer: 59 | Admitting: Obstetrics and Gynecology

## 2012-08-11 ENCOUNTER — Ambulatory Visit (INDEPENDENT_AMBULATORY_CARE_PROVIDER_SITE_OTHER): Payer: 59 | Admitting: Obstetrics and Gynecology

## 2012-08-11 ENCOUNTER — Encounter: Payer: Self-pay | Admitting: Obstetrics and Gynecology

## 2012-08-11 VITALS — BP 98/52 | Wt 104.0 lb

## 2012-08-11 DIAGNOSIS — O219 Vomiting of pregnancy, unspecified: Secondary | ICD-10-CM

## 2012-08-11 DIAGNOSIS — N90810 Female genital mutilation status, unspecified: Secondary | ICD-10-CM

## 2012-08-11 DIAGNOSIS — O09529 Supervision of elderly multigravida, unspecified trimester: Secondary | ICD-10-CM

## 2012-08-11 DIAGNOSIS — O210 Mild hyperemesis gravidarum: Secondary | ICD-10-CM

## 2012-08-11 DIAGNOSIS — Z3689 Encounter for other specified antenatal screening: Secondary | ICD-10-CM

## 2012-08-11 MED ORDER — VITAFOL-OB PO TABS: 1.0000 | ORAL_TABLET | Freq: Every day | ORAL | Status: DC

## 2012-08-11 NOTE — Progress Notes (Signed)
Routine Prenatal Visit: [redacted]w[redacted]d Pt c/o constipation when using prenatal vitamin.Script for vita-Fol given. Materna 21 =nl Anatomy U/S NV

## 2012-08-22 ENCOUNTER — Encounter: Payer: Self-pay | Admitting: Obstetrics and Gynecology

## 2012-08-22 ENCOUNTER — Ambulatory Visit (INDEPENDENT_AMBULATORY_CARE_PROVIDER_SITE_OTHER): Payer: 59

## 2012-08-22 ENCOUNTER — Ambulatory Visit (INDEPENDENT_AMBULATORY_CARE_PROVIDER_SITE_OTHER): Payer: 59 | Admitting: Obstetrics and Gynecology

## 2012-08-22 VITALS — BP 90/54 | Wt 104.0 lb

## 2012-08-22 DIAGNOSIS — O09529 Supervision of elderly multigravida, unspecified trimester: Secondary | ICD-10-CM

## 2012-08-22 DIAGNOSIS — Z331 Pregnant state, incidental: Secondary | ICD-10-CM

## 2012-08-22 DIAGNOSIS — Z3689 Encounter for other specified antenatal screening: Secondary | ICD-10-CM

## 2012-08-22 NOTE — Progress Notes (Signed)
Sono:  EFW:  12oz   CX: 3.29 cm   vertex presentation, fundal placenta edge is 8.1 cm on ternal os, fluid is normal (vertical pocket = 4.0 cm).   No anomalies seen (profile, palate, philtrum, nasal bone, open hands, 5th digit, heel, feet seen.  Female gender.   Normal ovaries, no fluid in CDS, normal adenexas.

## 2012-08-22 NOTE — Progress Notes (Signed)
No concerns today 

## 2012-08-22 NOTE — Progress Notes (Signed)
[redacted]w[redacted]d Normal 2nd trimester ultrasound. AMA: normal [facility] C/O ptyalism: suggest trial of Bentyl (previously prescribed)

## 2012-08-24 LAB — US OB COMP + 14 WK

## 2012-08-25 ENCOUNTER — Telehealth: Payer: Self-pay | Admitting: Obstetrics and Gynecology

## 2012-08-25 NOTE — Telephone Encounter (Signed)
Notified pts husband that she has refills of Bentyl at the pharmacy.  Verified [facility] [address] as pharmacy.  ld

## 2012-08-25 NOTE — Telephone Encounter (Signed)
Saw SR 08/22/12

## 2012-08-25 NOTE — Telephone Encounter (Signed)
Saw SR 08/22/12 

## 2012-08-25 NOTE — Telephone Encounter (Signed)
TRIAGE/RX FOLLOW UP °

## 2012-08-30 ENCOUNTER — Emergency Department (HOSPITAL_COMMUNITY)
Admission: EM | Admit: 2012-08-30 | Discharge: 2012-08-30 | Disposition: A | Discharge status: Home / Self Care | Payer: 59 | Attending: Emergency Medicine | Admitting: Emergency Medicine

## 2012-08-30 ENCOUNTER — Encounter (HOSPITAL_COMMUNITY): Payer: Self-pay | Admitting: Emergency Medicine

## 2012-08-30 DIAGNOSIS — R109 Unspecified abdominal pain: Secondary | ICD-10-CM | POA: Insufficient documentation

## 2012-08-30 DIAGNOSIS — Z3490 Encounter for supervision of normal pregnancy, unspecified, unspecified trimester: Secondary | ICD-10-CM

## 2012-08-30 DIAGNOSIS — O210 Mild hyperemesis gravidarum: Secondary | ICD-10-CM | POA: Insufficient documentation

## 2012-08-30 DIAGNOSIS — Z9089 Acquired absence of other organs: Secondary | ICD-10-CM | POA: Insufficient documentation

## 2012-08-30 DIAGNOSIS — R112 Nausea with vomiting, unspecified: Secondary | ICD-10-CM

## 2012-08-30 LAB — COMPREHENSIVE METABOLIC PANEL
ALT: 9 U/L (ref 0–35)
Albumin: 2.8 g/dL — ABNORMAL LOW (ref 3.5–5.2)
Alkaline Phosphatase: 57 U/L (ref 39–117)
BUN: 5 mg/dL — ABNORMAL LOW (ref 6–23)
Calcium: 8.7 mg/dL (ref 8.4–10.5)
GFR calc Af Amer: >90 mL/min (ref >90)
Glucose, Bld: 83 mg/dL (ref 70–99)
Potassium: 3.5 mEq/L (ref 3.5–5.1)
Sodium: 134 mEq/L — ABNORMAL LOW (ref 135–145)
Total Protein: 6.4 g/dL (ref 6.0–8.3)

## 2012-08-30 LAB — CBC WITH DIFFERENTIAL/PLATELET
Basophils Relative: 0 % (ref 0–1)
Eosinophils Absolute: 0.1 10*3/uL (ref 0.0–0.7)
Eosinophils Relative: 0 % (ref 0–5)
Lymphs Abs: 2.9 10*3/uL (ref 0.7–4.0)
MCH: 32.1 pg (ref 26.0–34.0)
MCHC: 35 g/dL (ref 30.0–36.0)
MCV: 92 fL (ref 78.0–100.0)
Neutrophils Relative %: 69 % (ref 43–77)
Platelets: 188 10*3/uL (ref 150–400)

## 2012-08-30 LAB — URINE MICROSCOPIC-ADD ON

## 2012-08-30 LAB — URINALYSIS, ROUTINE W REFLEX MICROSCOPIC
Bilirubin Urine: NEGATIVE
Glucose, UA: NEGATIVE mg/dL
Ketones, ur: NEGATIVE mg/dL
Protein, ur: NEGATIVE mg/dL
pH: 7 (ref 5.0–8.0)

## 2012-08-30 MED ORDER — ONDANSETRON HCL 4 MG PO TABS: 4.0000 mg | ORAL_TABLET | Freq: Four times a day (QID) | ORAL | Status: AC

## 2012-08-30 MED ORDER — DICYCLOMINE HCL 20 MG PO TABS: 20.0000 mg | ORAL_TABLET | Freq: Two times a day (BID) | ORAL | Status: DC

## 2012-08-30 MED ORDER — ACETAMINOPHEN 325 MG PO TABS
650.0000 mg | ORAL_TABLET | Freq: Once | ORAL | Status: AC
Start: 2012-08-30 — End: 2012-08-30
  Administered 2012-08-30: 650 mg via ORAL
  Filled 2012-08-30: qty 2

## 2012-08-30 MED ORDER — SODIUM CHLORIDE 0.9 % IV BOLUS (SEPSIS)
1000.0000 mL | Freq: Once | INTRAVENOUS | Status: AC
  Administered 2012-08-30: 1000 mL via INTRAVENOUS

## 2012-08-30 MED ORDER — ONDANSETRON HCL 4 MG/2ML IJ SOLN
4.0000 mg | INTRAMUSCULAR | Status: AC
  Administered 2012-08-30: 4 mg via INTRAVENOUS
  Filled 2012-08-30: qty 2

## 2012-08-30 NOTE — ED Provider Notes (Signed)
History     CSN: [id number]  Arrival date & time 08/30/12  0710   First MD Initiated Contact with Patient 08/30/12 [phone]      Chief Complaint  Patient presents with  . Abdominal Pain    (Consider location/radiation/quality/duration/timing/severity/associated sxs/prior treatment) HPI Comments: Patient is 66 weeks old and presents with mid back pain that radiates around her flanks to her upper abdomen. She reports the pain starting 5 hours ago and has been present ever since. The pain is described as cramping and intermittent. At its worst, the pain is 9/10. She has not taken anything for pain and reports associated nausea and vomiting. Her last OBGYN visit was about 1 week ago where an ultrasound was done and there were no problems. She denies any fever, diarrhea, vaginal bleeding.   Patient is a 36 y.o. female presenting with abdominal pain.  Abdominal Pain The primary symptoms of the illness include abdominal pain, nausea and vomiting. The primary symptoms of the illness do not include fever, fatigue, shortness of breath, diarrhea, dysuria or vaginal bleeding.  Additional symptoms associated with the illness include back pain. Symptoms associated with the illness do not include chills, diaphoresis or hematuria.    Past Medical History  Diagnosis Date  . Irregular menses   . 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 H/O cold sores     ACYCLOVIR OINTMENT PRN  . Infertility associated with anovulation     HAS TAKEN CLOMID IN THE PAST    Past Surgical History  Procedure Date  . Appendectomy     6 YOA    Family History  Problem Relation Age of Onset  . Heart disease Mother     PALPITATIONS  . Hypertension Mother     History  Substance Use Topics  . Smoking status: Never Smoker   . Smokeless tobacco: Never Used  . Alcohol Use: No    OB History    Grav Para Term Preterm Abortions TAB SAB Ect Mult Living   2 0   1 0 1         Review of Systems  Constitutional: Negative for fever,  chills, diaphoresis and fatigue.  Respiratory: Negative for shortness of breath.   Cardiovascular: Negative for chest pain.  Gastrointestinal: Positive for nausea, vomiting and abdominal pain. Negative for diarrhea.  Genitourinary: Negative for dysuria, hematuria, vaginal bleeding and pelvic pain.  Musculoskeletal: Positive for back pain.  Skin: Negative for rash and wound.  Neurological: Negative for dizziness, weakness, numbness and headaches.    Allergies  Review of patient's allergies indicates no known allergies.  Home Medications   Current Outpatient Rx  Name Route Sig Dispense Refill  . DICYCLOMINE HCL 10 MG PO CAPS Oral Take 1 capsule (10 mg total) by mouth 4 (four) times daily -  before meals and at bedtime. 90 capsule 6  . ONDANSETRON HCL 8 MG PO TABS Oral Take 8 mg by mouth every 8 (eight) hours as needed. For nausea    . VITAFOL-OB PO TABS Oral Take 1 tablet by mouth daily. 30 each 12  . PRESCRIPTION MEDICATION Vaginal Place 1 capsule vaginally at bedtime. Compounded Progesterone 100 mg capsule  Place 1 capsule in vagina at bedtime      BP 104/69  Pulse 83  Temp 98.4 F (36.9 C) (Oral)  Resp 16  SpO2 100%  LMP 04/06/2012  Physical Exam  Nursing note and vitals reviewed. Constitutional: She is oriented to person, place, and time. She appears well-developed and well-nourished. No  distress.  HENT:  Head: Normocephalic and atraumatic.  Eyes: Conjunctivae are normal. No scleral icterus.  Neck: Normal range of motion.  Cardiovascular: Normal rate and regular rhythm.  Exam reveals no gallop and no friction rub.   No murmur heard. Pulmonary/Chest: Effort normal and breath sounds normal. No respiratory distress. She has no wheezes. She has no rales. She exhibits no tenderness.  Abdominal: Soft. She exhibits no distension. There is tenderness. There is no rebound and no guarding.       Notable upper abdominal tenderness to palpation.   Musculoskeletal: Normal range of  motion.  Neurological: She is alert and oriented to person, place, and time.  Skin: Skin is warm and dry. She is not diaphoretic.  Psychiatric: She has a normal mood and affect. Her behavior is normal.    ED Course  Procedures (including critical care time)   Labs Reviewed  CBC WITH DIFFERENTIAL  COMPREHENSIVE METABOLIC PANEL  URINALYSIS, ROUTINE W REFLEX MICROSCOPIC  LIPASE, BLOOD   No results found.   No diagnosis found.    MDM  8:10 AM Patient is [redacted] weeks pregnant and having upper abdominal and back pain. I have ordered labs and urinalysis. I will reassess when labs have resulted.   11:57 AM Urinalysis has resulted which shows no infection and trace blood. Patient should follow up with OBGYN. No further evaluation needed at this time. Plan discussed with Dr. Rubin Payor who is agreeable.     Emilia Beck, PA-C 08/30/12 1200

## 2012-08-30 NOTE — ED Provider Notes (Signed)
Medical screening examination/treatment/procedure(s) were conducted as a shared visit with non-physician practitioner(s) and myself.  I personally evaluated the patient during the encounter. Epigastric tenderness. No rebound or guarding. Labs reassuring. She is pregnant. Patient feels somewhat better be discharged home. CholeLithiasis the possibility of an ultrasound was done at this time. She'll followup as needed.  Juliet Rude. Rubin Payor, MD 08/30/12 1610

## 2012-08-30 NOTE — ED Notes (Signed)
Pt states she is having abd pain that started in the kidney area and radiates around to the front to the top of the stomach  Pt had vomiting that started around 0600  Denies any vaginal spotting or bleeding  Pt is [redacted] weeks pregnant

## 2012-09-19 ENCOUNTER — Ambulatory Visit (INDEPENDENT_AMBULATORY_CARE_PROVIDER_SITE_OTHER): Payer: 59 | Admitting: Obstetrics and Gynecology

## 2012-09-19 VITALS — BP 90/60 | Wt 109.0 lb

## 2012-09-19 DIAGNOSIS — O09529 Supervision of elderly multigravida, unspecified trimester: Secondary | ICD-10-CM

## 2012-09-19 DIAGNOSIS — O26849 Uterine size-date discrepancy, unspecified trimester: Secondary | ICD-10-CM

## 2012-09-19 NOTE — Progress Notes (Signed)
[redacted]w[redacted]d  No complaints 7 lb wt gain this pregnancy 1hr glu and U/S for growth at NV

## 2012-10-18 ENCOUNTER — Other Ambulatory Visit: Payer: Self-pay | Admitting: Obstetrics and Gynecology

## 2012-10-18 ENCOUNTER — Encounter: Payer: Self-pay | Admitting: Obstetrics and Gynecology

## 2012-10-18 ENCOUNTER — Other Ambulatory Visit: Payer: 59

## 2012-10-18 ENCOUNTER — Ambulatory Visit (INDEPENDENT_AMBULATORY_CARE_PROVIDER_SITE_OTHER): Payer: 59

## 2012-10-18 ENCOUNTER — Ambulatory Visit (INDEPENDENT_AMBULATORY_CARE_PROVIDER_SITE_OTHER): Payer: 59 | Admitting: Obstetrics and Gynecology

## 2012-10-18 VITALS — BP 98/60 | Wt 115.0 lb

## 2012-10-18 DIAGNOSIS — O26849 Uterine size-date discrepancy, unspecified trimester: Secondary | ICD-10-CM

## 2012-10-18 DIAGNOSIS — Z331 Pregnant state, incidental: Secondary | ICD-10-CM

## 2012-10-18 MED ORDER — RANITIDINE HCL 150 MG PO TABS: ORAL_TABLET | ORAL | Status: DC

## 2012-10-18 NOTE — Progress Notes (Signed)
[redacted]w[redacted]d Glucola Given Pt c/o heartburn unrepsonsive to [place].  rec  Zantac 150 mg hs and am (requested printed script)  Ultrasound shows:  Gestational age by [place]     SIUP  S=D     [place] EDD: 01/07/2013             AFI: 12.9 cm            Cervical length: n/a cm            Placenta localization: posterior           Fetal presentation: breech footling      Anatomy survey is normal Comments: Footling Breech Posterior Placenta AFI is normal ( 35th% )

## 2012-10-18 NOTE — Progress Notes (Signed)
Hx orolabial eruption 1 yr prior to pregnancy, none since.  Will do HSV I and III at next visit to determine need for Valtrex at 34 wks

## 2012-10-19 LAB — RPR

## 2012-10-19 LAB — US OB FOLLOW UP

## 2012-10-19 LAB — GLUCOSE TOLERANCE, 1 HOUR (50G) W/O FASTING: Glucose, 1 Hour GTT: 107 mg/dL (ref 70–140)

## 2012-11-01 ENCOUNTER — Ambulatory Visit (INDEPENDENT_AMBULATORY_CARE_PROVIDER_SITE_OTHER): Payer: 59 | Admitting: Obstetrics and Gynecology

## 2012-11-01 VITALS — BP 98/56 | Wt 116.0 lb

## 2012-11-01 DIAGNOSIS — K1370 Unspecified lesions of oral mucosa: Secondary | ICD-10-CM

## 2012-11-01 DIAGNOSIS — O26849 Uterine size-date discrepancy, unspecified trimester: Secondary | ICD-10-CM

## 2012-11-01 DIAGNOSIS — N9089 Other specified noninflammatory disorders of vulva and perineum: Secondary | ICD-10-CM

## 2012-11-01 DIAGNOSIS — O09529 Supervision of elderly multigravida, unspecified trimester: Secondary | ICD-10-CM

## 2012-11-01 NOTE — Progress Notes (Signed)
[redacted]w[redacted]d 10/18/2012 1 hr GTT = 107  S<D U/S next visit

## 2012-11-02 LAB — HSV 2 ANTIBODY, IGG: HSV 2 Glycoprotein G Ab, IgG: 0.15 IV

## 2012-11-15 ENCOUNTER — Ambulatory Visit (INDEPENDENT_AMBULATORY_CARE_PROVIDER_SITE_OTHER): Payer: 59 | Admitting: Obstetrics and Gynecology

## 2012-11-15 ENCOUNTER — Ambulatory Visit (INDEPENDENT_AMBULATORY_CARE_PROVIDER_SITE_OTHER): Payer: 59

## 2012-11-15 VITALS — BP 98/64 | Wt 117.0 lb

## 2012-11-15 DIAGNOSIS — O26849 Uterine size-date discrepancy, unspecified trimester: Secondary | ICD-10-CM

## 2012-11-15 DIAGNOSIS — O09529 Supervision of elderly multigravida, unspecified trimester: Secondary | ICD-10-CM

## 2012-11-15 DIAGNOSIS — B009 Herpesviral infection, unspecified: Secondary | ICD-10-CM

## 2012-11-15 NOTE — Progress Notes (Signed)
[redacted]w[redacted]d    Ultrasound shows:  EFW 4lbz 0oz  33%        [place] EDD: 01/11/2013            AFI: 11.78cm            Cervical length:  CX Not Measured            Placenta localization: fundal            Fetal presentation: breech Homero Fellers        Comments:AFI normal 30th %  Will repeat u/s at 36 wks if unable to tell baby is vertex HSV I positive.  HSVII neg.  Will start Valtrex at 34 wks

## 2012-11-28 LAB — US OB FOLLOW UP

## 2012-11-30 ENCOUNTER — Ambulatory Visit (INDEPENDENT_AMBULATORY_CARE_PROVIDER_SITE_OTHER): Payer: 59 | Admitting: Obstetrics and Gynecology

## 2012-11-30 VITALS — BP 100/62 | Wt 120.0 lb

## 2012-11-30 DIAGNOSIS — O09529 Supervision of elderly multigravida, unspecified trimester: Secondary | ICD-10-CM

## 2012-11-30 DIAGNOSIS — O26849 Uterine size-date discrepancy, unspecified trimester: Secondary | ICD-10-CM

## 2012-11-30 DIAGNOSIS — O26843 Uterine size-date discrepancy, third trimester: Secondary | ICD-10-CM

## 2012-11-30 NOTE — Progress Notes (Signed)
34 WKS 0 DAYS Pt stated no issues today .  S<D  U/S NV

## 2012-12-15 ENCOUNTER — Ambulatory Visit (INDEPENDENT_AMBULATORY_CARE_PROVIDER_SITE_OTHER): Payer: 59 | Admitting: Obstetrics and Gynecology

## 2012-12-15 ENCOUNTER — Ambulatory Visit (INDEPENDENT_AMBULATORY_CARE_PROVIDER_SITE_OTHER): Payer: 59

## 2012-12-15 VITALS — BP 90/60 | Wt 119.0 lb

## 2012-12-15 DIAGNOSIS — O26843 Uterine size-date discrepancy, third trimester: Secondary | ICD-10-CM

## 2012-12-15 DIAGNOSIS — Z331 Pregnant state, incidental: Secondary | ICD-10-CM

## 2012-12-15 DIAGNOSIS — O09529 Supervision of elderly multigravida, unspecified trimester: Secondary | ICD-10-CM

## 2012-12-15 DIAGNOSIS — O321XX0 Maternal care for breech presentation, not applicable or unspecified: Secondary | ICD-10-CM

## 2012-12-15 DIAGNOSIS — O26849 Uterine size-date discrepancy, unspecified trimester: Secondary | ICD-10-CM

## 2012-12-15 LAB — POCT URINALYSIS DIPSTICK
Bilirubin, UA: NEGATIVE
Glucose, UA: NEGATIVE
Spec Grav, UA: 1.01
pH, UA: 6

## 2012-12-15 NOTE — Progress Notes (Signed)
.   Ultrasound shows:  EFW 6lbs13oz 45%ILE        [place] EDD: 01/11/2013             AFI: 14.53cm            Cervical length: not measured             Placenta localization: posterior fundal            Fetal presentation: breech (frank)    Anatomy survey completed yes    Anatomy survey is normal            Gender : female    Comments:AFI is normal(50th%) Options reviewed for management of breech presentation: Pt wants ECV.  Risks and benefits reviewed.  Scheduled for 12/19/12 at 8 am.  Pt to arrive at 7am GBS done

## 2012-12-16 ENCOUNTER — Telehealth (HOSPITAL_COMMUNITY): Payer: Self-pay | Admitting: *Deleted

## 2012-12-16 ENCOUNTER — Encounter (HOSPITAL_COMMUNITY): Payer: Self-pay | Admitting: *Deleted

## 2012-12-16 ENCOUNTER — Telehealth: Payer: Self-pay | Admitting: Obstetrics and Gynecology

## 2012-12-16 ENCOUNTER — Telehealth: Payer: Self-pay

## 2012-12-16 NOTE — Telephone Encounter (Signed)
Preadmission screen Interpreter number [phone]

## 2012-12-16 NOTE — Telephone Encounter (Signed)
Tc from pt's husband. Informed of version appt. Pt to go into [facility].  Pt's husband agrees and voices understanding.

## 2012-12-16 NOTE — Telephone Encounter (Signed)
External Version scheduled for 12/19/12 @ 8:00 with [facility]. -Adrianne Pridgen

## 2012-12-16 NOTE — Telephone Encounter (Signed)
Lm on vm to cb per version appt sched 12/19/12 @ 8:00a. Pt to arrive @ 7:15a @ [facility].

## 2012-12-16 NOTE — Telephone Encounter (Signed)
Message copied by Raylene Everts on Fri Dec 16, 2012  9:01 AM ------      Message from: Dierdre Forth P      Created: Thu Dec 15, 2012  9:23 PM      Regarding: schedule       Diagnosis:  BREECH       Procedure:  external cephalic version      Time required:        Surgeon: Dr Pennie Rushing      Assistant:CNM            Pre-op needed: no        Post-op visit:none            Anesthesia: none      Date requested:  12/19/12          Location:  [facility]      Patient status: outpatient            Pre-op orders:  Already ordered in Epic:  no         Enter routine orders per Dr Pennie Rushing: yes             Special note: I have scheduled.  Please notify pt            PLEASE SEND iCal ATTACHMENT TO CONFIRM SCHEDULING

## 2012-12-17 LAB — STREP B DNA PROBE: GBSP: NEGATIVE

## 2012-12-19 ENCOUNTER — Encounter (HOSPITAL_COMMUNITY): Payer: 59

## 2012-12-19 ENCOUNTER — Inpatient Hospital Stay (HOSPITAL_COMMUNITY): Payer: 59

## 2012-12-19 LAB — US OB FOLLOW UP

## 2012-12-26 ENCOUNTER — Ambulatory Visit (INDEPENDENT_AMBULATORY_CARE_PROVIDER_SITE_OTHER): Payer: 59 | Admitting: Obstetrics and Gynecology

## 2012-12-26 DIAGNOSIS — O321XX0 Maternal care for breech presentation, not applicable or unspecified: Secondary | ICD-10-CM

## 2012-12-26 DIAGNOSIS — Z331 Pregnant state, incidental: Secondary | ICD-10-CM

## 2012-12-26 DIAGNOSIS — Z3490 Encounter for supervision of normal pregnancy, unspecified, unspecified trimester: Secondary | ICD-10-CM

## 2012-12-27 ENCOUNTER — Encounter: Payer: Self-pay | Admitting: Obstetrics and Gynecology

## 2012-12-27 ENCOUNTER — Encounter: Payer: 59 | Admitting: Obstetrics and Gynecology

## 2012-12-27 ENCOUNTER — Telehealth: Payer: Self-pay | Admitting: Obstetrics and Gynecology

## 2012-12-27 ENCOUNTER — Ambulatory Visit (INDEPENDENT_AMBULATORY_CARE_PROVIDER_SITE_OTHER): Payer: 59

## 2012-12-27 ENCOUNTER — Other Ambulatory Visit: Payer: Self-pay | Admitting: Obstetrics and Gynecology

## 2012-12-27 ENCOUNTER — Ambulatory Visit (INDEPENDENT_AMBULATORY_CARE_PROVIDER_SITE_OTHER): Payer: 59 | Admitting: Obstetrics and Gynecology

## 2012-12-27 VITALS — BP 100/70 | Wt 121.0 lb

## 2012-12-27 DIAGNOSIS — Z3490 Encounter for supervision of normal pregnancy, unspecified, unspecified trimester: Secondary | ICD-10-CM

## 2012-12-27 DIAGNOSIS — O321XX0 Maternal care for breech presentation, not applicable or unspecified: Secondary | ICD-10-CM

## 2012-12-27 DIAGNOSIS — Z331 Pregnant state, incidental: Secondary | ICD-10-CM

## 2012-12-27 LAB — US OB LIMITED

## 2012-12-27 MED ORDER — VALACYCLOVIR HCL 500 MG PO TABS: 500.0000 mg | ORAL_TABLET | Freq: Every day | ORAL | Status: DC

## 2012-12-27 MED ORDER — PANTOPRAZOLE SODIUM 40 MG PO TBEC: 40.0000 mg | DELAYED_RELEASE_TABLET | Freq: Every day | ORAL | Status: DC

## 2012-12-27 NOTE — Progress Notes (Signed)
R/S from yesterday--still breech, with normal fluid. Wants C/S with [facility]--reviewed guidelines for 39 weeks. Needs Rx for Protonix (Zantac not working), and for Valtrex per previous discussion with [facility] regarding cold sores--Rxs sent to pharmacy.

## 2012-12-27 NOTE — Progress Notes (Signed)
[redacted]w[redacted]d Pt canceled external version  Ultrasound shows:  SIUP  S=D     [place] EDD: 01/11/13           AFI: 12.8 cm = 45th%tile           Cervical length: not evaluated            Placenta localization: anterior fundal           Fetal presentation: breech frank

## 2012-12-27 NOTE — Telephone Encounter (Signed)
Primary C/S scheduled for 01/06/13 @ 9:00 with [facility]. -Adrianne Pridgen

## 2012-12-27 NOTE — Progress Notes (Signed)
R/S for 12/27/12 per patient's husband's request.

## 2012-12-30 ENCOUNTER — Other Ambulatory Visit: Payer: Self-pay | Admitting: Obstetrics and Gynecology

## 2013-01-02 ENCOUNTER — Telehealth: Payer: Self-pay | Admitting: Obstetrics and Gynecology

## 2013-01-02 NOTE — Telephone Encounter (Signed)
Tc to pt regarding msg.  Lm on vm to call back. 

## 2013-01-03 ENCOUNTER — Encounter (HOSPITAL_COMMUNITY)
Admission: RE | Admit: 2013-01-03 | Discharge: 2013-01-03 | Disposition: A | Discharge status: Home / Self Care | Payer: Medicaid Other | Source: Ambulatory Visit | Attending: Obstetrics and Gynecology | Admitting: Obstetrics and Gynecology

## 2013-01-03 ENCOUNTER — Encounter (HOSPITAL_COMMUNITY): Payer: Self-pay | Admitting: Pharmacy Technician

## 2013-01-03 ENCOUNTER — Encounter: Payer: Self-pay | Admitting: Obstetrics and Gynecology

## 2013-01-03 ENCOUNTER — Encounter (HOSPITAL_COMMUNITY): Payer: Self-pay

## 2013-01-03 VITALS — BP 124/82 | HR 94 | Wt 120.0 lb

## 2013-01-03 DIAGNOSIS — O09529 Supervision of elderly multigravida, unspecified trimester: Secondary | ICD-10-CM

## 2013-01-03 HISTORY — DX: Gastro-esophageal reflux disease without esophagitis: K21.9

## 2013-01-03 LAB — CBC
HCT: 38 % (ref 36.0–46.0)
Hemoglobin: 12.7 g/dL (ref 12.0–15.0)
MCH: 31.1 pg (ref 26.0–34.0)
MCHC: 33.4 g/dL (ref 30.0–36.0)
MCV: 93.1 fL (ref 78.0–100.0)
RBC: 4.08 MIL/uL (ref 3.87–5.11)

## 2013-01-03 LAB — SURGICAL PCR SCREEN: MRSA, PCR: NEGATIVE

## 2013-01-03 LAB — ABO/RH: ABO/RH(D): A POS

## 2013-01-03 NOTE — Patient Instructions (Addendum)
   Your procedure is scheduled [id number] January 10th  Enter through the Main Entrance of [facility] at:7:30am Pick up the phone at the desk and dial [phone] and inform us of your arrival.  Please call this number if you have any problems the morning of surgery: [phone]  Remember: Do not eat or drink anything after midnight on Thursday Please take your Ranitidine morning of surgery with sips of water only  Do not wear jewelry, make-up, or FINGER nail polish No metal in your hair or on your body. Do not wear lotions, powders, perfumes. You may wear deodorant.  Please use your CHG wash as directed prior to surgery.  Do not shave anywhere for at least 12 hours prior to first CHG shower.  Do not bring valuables to the hospital.   Leave suitcase in the car. After Surgery it may be brought to your room. For patients being admitted to the hospital, checkout time is 11:00am the day of discharge.

## 2013-01-03 NOTE — Pre-Procedure Instructions (Signed)
Cbc not returned as yet-pph not updated

## 2013-01-03 NOTE — Telephone Encounter (Signed)
Pt's husband returned call.  Informed pt should have started the Valtrex @ 34 weeks per [facility] note, therefore pt should go ahead and begin taking the Valtrex daily, pt's husband voices agreement.

## 2013-01-03 NOTE — Pre-Procedure Instructions (Addendum)
[id number] Arabic Interpreter

## 2013-01-04 ENCOUNTER — Other Ambulatory Visit: Payer: Self-pay

## 2013-01-04 DIAGNOSIS — O321XX0 Maternal care for breech presentation, not applicable or unspecified: Secondary | ICD-10-CM

## 2013-01-05 ENCOUNTER — Ambulatory Visit (INDEPENDENT_AMBULATORY_CARE_PROVIDER_SITE_OTHER): Payer: Medicaid Other

## 2013-01-05 ENCOUNTER — Ambulatory Visit (INDEPENDENT_AMBULATORY_CARE_PROVIDER_SITE_OTHER): Payer: 59 | Admitting: Obstetrics and Gynecology

## 2013-01-05 ENCOUNTER — Encounter: Payer: 59 | Admitting: Obstetrics and Gynecology

## 2013-01-05 VITALS — BP 100/58 | Wt 123.0 lb

## 2013-01-05 DIAGNOSIS — O321XX0 Maternal care for breech presentation, not applicable or unspecified: Secondary | ICD-10-CM

## 2013-01-05 DIAGNOSIS — Z3490 Encounter for supervision of normal pregnancy, unspecified, unspecified trimester: Secondary | ICD-10-CM

## 2013-01-05 DIAGNOSIS — Z331 Pregnant state, incidental: Secondary | ICD-10-CM

## 2013-01-05 LAB — US OB LIMITED

## 2013-01-05 NOTE — H&P (Signed)
Gabriela Le is a 37 y.o. female, G2P0010 at [redacted]w[redacted]d weeks, presenting for CS due to breech presentation.  Patient Active Problem List  Diagnosis  . HERPES LABIALIS  . Vitamin d deficiency  . 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 HSV-1 infection  . Nausea and vomiting in pregnancy prior to [redacted] weeks gestation  . Ptyalism  . Female circumcision  . AMA (advanced maternal age) multigravida 35+  . Breech presentation    History of present pregnancy: Patient entered care at 10 weeks.   EDC of 01/11/13 was established by LMP.   Anatomy scan:  19 weeks, with normal findings and an fundal placenta.   Additional [place] evaluations:  28 weeks with normal growth, breech presentation; 32 weeks with normal growth and fluid, persistent breech; 36 week normal growth and fluid, breech; 39 weeks breech.   Significant prenatal events:  Pt initially wanted a version but then cancelled, and elected to schedule a CS.   Last evaluation:  Placenta noted to be anterior at 39 week scan Pt started valtrax for HSV1 during pregnancy  OB History    Grav Para Term Preterm Abortions TAB SAB Ect Mult Living   2 0   1 0 1       SAB 2001  Past Medical History  Diagnosis Date  . Irregular menses   . 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 H/O cold sores     ACYCLOVIR OINTMENT PRN  . Infertility associated with anovulation     HAS TAKEN CLOMID IN THE PAST  . GERD (gastroesophageal reflux disease)    Past Surgical History  Procedure Date  . Appendectomy     6 YOA   Family History: family history includes Heart disease in her mother and Hypertension in her mother. Social History:  reports that she has never smoked. She has never used smokeless tobacco. She reports that she does not drink alcohol or use illicit drugs.  FOB: [name], married. Interpreter present.   Prenatal Transfer Tool  Maternal Diabetes: No Genetic Screening: Normal Maternal Ultrasounds/Referrals: Normal Breech presentation Fetal Ultrasounds or other  Referrals:  None Maternal Substance Abuse:  No Significant Maternal Medications:  Meds include: Other: Valtrax Significant Maternal Lab Results: Lab values include: Group B Strep negative    ROS:  + FM, occasional contractions.  No Known Allergies     Blood pressure 128/84, pulse 88, temperature 98.4 F (36.9 C), temperature source Oral, resp. rate 18, height 5\' 2"  (1.575 m), weight 123 lb (55.792 kg), last menstrual period 04/06/2012, SpO2 100.00%.  Chest clear Heart RRR without murmur Abd gravid, NT, FH 36weeks Pelvic: deferred, noted for female circumcision Ext: WNL  FHR: 152 UCs:  none  Prenatal labs: ABO, Rh: --/--/A POS (01/10 0740) Antibody: PENDING (01/10 0740) Rubella:   Immune RPR: NON REACTIVE (01/07 1600)  HBsAg: NEGATIVE (07/18 1536)  HIV: NON REACTIVE (07/18 1536)  GBS: NEGATIVE (12/19 1756) Sickle cell/Hgb electrophoresis:  normal Pap:  Normal on 07/14/12 GC:  Neg at [facility]B Chlamydia:  Neg at [facility] Genetic screenings:  Normal materniT21 Glucola:  normal Other:  +HSV1 by titer       Assessment/Plan: IUP at [redacted]w[redacted]d Persistent breech, desires CS Language barrier, interpreter present  Plan: Admit to [facility] per consult with Dr Pennie Rushing.  Routine [facility] preop orders RNB of CS will again be reviewed via interpreter.   Gray Bernhardt, [place] 01/06/2013, 8:29 AM Nigel Bridgeman CNM

## 2013-01-05 NOTE — Progress Notes (Signed)
[place] confirms persistent breech presentation. Scheduled for C/S 01/06/13 at 9am with [facility]. Reviewed process of C/S and anticipated hospital course. Questions reviewed with patient and husband as interpreter.

## 2013-01-05 NOTE — Progress Notes (Signed)
Pt stated no issues today.  Ultrasound shows:  SIUP  S=D     [place] EDD: 01/11/2013           AFI: 11.2cm            Placenta localization: anterior           Fetal presentation: breech frank                    Anatomy survey is normal           Gender : female Comments:Normal fluid : AFI 11.2 cm =40th % tile.                     CX not evaluated.

## 2013-01-06 ENCOUNTER — Encounter (HOSPITAL_COMMUNITY): Payer: Self-pay | Admitting: *Deleted

## 2013-01-06 ENCOUNTER — Encounter (HOSPITAL_COMMUNITY)
Admission: RE | Disposition: A | Discharge status: Home / Self Care | Payer: Self-pay | Source: Ambulatory Visit | Attending: Obstetrics and Gynecology

## 2013-01-06 ENCOUNTER — Encounter (HOSPITAL_COMMUNITY): Payer: Self-pay | Admitting: Anesthesiology

## 2013-01-06 ENCOUNTER — Inpatient Hospital Stay (HOSPITAL_COMMUNITY)
Admission: RE | Admit: 2013-01-06 | Discharge: 2013-01-09 | DRG: 766 | Disposition: A | Discharge status: Home / Self Care | Payer: Medicaid Other | Source: Ambulatory Visit | Attending: Obstetrics and Gynecology | Admitting: Obstetrics and Gynecology

## 2013-01-06 ENCOUNTER — Inpatient Hospital Stay (HOSPITAL_COMMUNITY): Payer: Medicaid Other | Admitting: Anesthesiology

## 2013-01-06 DIAGNOSIS — O321XX0 Maternal care for breech presentation, not applicable or unspecified: Principal | ICD-10-CM | POA: Diagnosis present

## 2013-01-06 DIAGNOSIS — O9903 Anemia complicating the puerperium: Secondary | ICD-10-CM | POA: Diagnosis not present

## 2013-01-06 DIAGNOSIS — O99892 Other specified diseases and conditions complicating childbirth: Secondary | ICD-10-CM | POA: Diagnosis present

## 2013-01-06 DIAGNOSIS — D649 Anemia, unspecified: Secondary | ICD-10-CM | POA: Diagnosis not present

## 2013-01-06 DIAGNOSIS — N90810 Female genital mutilation status, unspecified: Secondary | ICD-10-CM | POA: Diagnosis present

## 2013-01-06 DIAGNOSIS — O09529 Supervision of elderly multigravida, unspecified trimester: Secondary | ICD-10-CM | POA: Diagnosis present

## 2013-01-06 LAB — TYPE AND SCREEN: ABO/RH(D): A POS

## 2013-01-06 SURGERY — Surgical Case
Anesthesia: Spinal | Site: Abdomen | Wound class: Clean Contaminated

## 2013-01-06 MED ORDER — CEFAZOLIN SODIUM-DEXTROSE 2-3 GM-% IV SOLR
INTRAVENOUS | Status: AC
  Filled 2013-01-06: qty 50

## 2013-01-06 MED ORDER — PRENATAL MULTIVITAMIN CH
1.0000 | ORAL_TABLET | Freq: Every day | ORAL | Status: DC
  Administered 2013-01-07 – 2013-01-09 (×3): 1 via ORAL
  Filled 2013-01-06 (×3): qty 1

## 2013-01-06 MED ORDER — OXYTOCIN 40 UNITS IN LACTATED RINGERS INFUSION - SIMPLE MED: 62.5000 mL/h | INTRAVENOUS | Status: AC

## 2013-01-06 MED ORDER — DIPHENHYDRAMINE HCL 50 MG/ML IJ SOLN: 25.0000 mg | INTRAMUSCULAR | Status: DC | PRN

## 2013-01-06 MED ORDER — MORPHINE SULFATE 0.5 MG/ML IJ SOLN
INTRAMUSCULAR | Status: AC
  Filled 2013-01-06: qty 10

## 2013-01-06 MED ORDER — KETOROLAC TROMETHAMINE 30 MG/ML IJ SOLN: 30.0000 mg | Freq: Four times a day (QID) | INTRAMUSCULAR | Status: AC | PRN

## 2013-01-06 MED ORDER — WITCH HAZEL-GLYCERIN EX PADS: 1.0000 "application " | MEDICATED_PAD | CUTANEOUS | Status: DC | PRN

## 2013-01-06 MED ORDER — OXYTOCIN 10 UNIT/ML IJ SOLN
40.0000 [IU] | INTRAVENOUS | Status: DC | PRN
  Administered 2013-01-06: 40 [IU] via INTRAVENOUS

## 2013-01-06 MED ORDER — SIMETHICONE 80 MG PO CHEW
80.0000 mg | CHEWABLE_TABLET | Freq: Three times a day (TID) | ORAL | Status: DC
  Administered 2013-01-06 – 2013-01-09 (×10): 80 mg via ORAL

## 2013-01-06 MED ORDER — BUPIVACAINE HCL (PF) 0.25 % IJ SOLN
INTRAMUSCULAR | Status: DC | PRN
  Administered 2013-01-06: 10 mL

## 2013-01-06 MED ORDER — DIPHENHYDRAMINE HCL 50 MG/ML IJ SOLN
INTRAMUSCULAR | Status: DC | PRN
  Administered 2013-01-06: 12.5 mg via INTRAVENOUS

## 2013-01-06 MED ORDER — FENTANYL CITRATE 0.05 MG/ML IJ SOLN
INTRAMUSCULAR | Status: AC
  Administered 2013-01-06: 50 ug via INTRAVENOUS
  Filled 2013-01-06: qty 2

## 2013-01-06 MED ORDER — KETOROLAC TROMETHAMINE 30 MG/ML IJ SOLN
30.0000 mg | Freq: Four times a day (QID) | INTRAMUSCULAR | Status: AC | PRN
  Administered 2013-01-06 (×2): 30 mg via INTRAVENOUS
  Filled 2013-01-06: qty 1

## 2013-01-06 MED ORDER — EPHEDRINE 5 MG/ML INJ
INTRAVENOUS | Status: AC
  Filled 2013-01-06: qty 10

## 2013-01-06 MED ORDER — ONDANSETRON HCL 4 MG PO TABS: 4.0000 mg | ORAL_TABLET | ORAL | Status: DC | PRN

## 2013-01-06 MED ORDER — TETANUS-DIPHTH-ACELL PERTUSSIS 5-2.5-18.5 LF-MCG/0.5 IM SUSP
0.5000 mL | Freq: Once | INTRAMUSCULAR | Status: AC
  Administered 2013-01-08: 0.5 mL via INTRAMUSCULAR
  Filled 2013-01-06: qty 0.5

## 2013-01-06 MED ORDER — KETOROLAC TROMETHAMINE 30 MG/ML IJ SOLN
INTRAMUSCULAR | Status: AC
  Administered 2013-01-06: 30 mg via INTRAVENOUS
  Filled 2013-01-06: qty 1

## 2013-01-06 MED ORDER — MEPERIDINE HCL 25 MG/ML IJ SOLN: 6.2500 mg | INTRAMUSCULAR | Status: DC | PRN

## 2013-01-06 MED ORDER — NALBUPHINE SYRINGE 5 MG/0.5 ML
5.0000 mg | INJECTION | INTRAMUSCULAR | Status: DC | PRN
  Administered 2013-01-06: 10 mg via SUBCUTANEOUS
  Filled 2013-01-06: qty 1

## 2013-01-06 MED ORDER — SODIUM CHLORIDE 0.9 % IJ SOLN: 3.0000 mL | INTRAMUSCULAR | Status: DC | PRN

## 2013-01-06 MED ORDER — DIPHENHYDRAMINE HCL 25 MG PO CAPS: 25.0000 mg | ORAL_CAPSULE | Freq: Four times a day (QID) | ORAL | Status: DC | PRN

## 2013-01-06 MED ORDER — FENTANYL CITRATE 0.05 MG/ML IJ SOLN
25.0000 ug | INTRAMUSCULAR | Status: DC | PRN
  Administered 2013-01-06: 50 ug via INTRAVENOUS

## 2013-01-06 MED ORDER — OXYCODONE-ACETAMINOPHEN 5-325 MG PO TABS
1.0000 | ORAL_TABLET | ORAL | Status: DC | PRN
  Administered 2013-01-07 – 2013-01-08 (×5): 2 via ORAL
  Administered 2013-01-08 – 2013-01-09 (×3): 1 via ORAL
  Filled 2013-01-06 (×2): qty 1
  Filled 2013-01-06 (×3): qty 2
  Filled 2013-01-06: qty 1
  Filled 2013-01-06: qty 2
  Filled 2013-01-06 (×3): qty 1

## 2013-01-06 MED ORDER — LANOLIN HYDROUS EX OINT: 1.0000 "application " | TOPICAL_OINTMENT | CUTANEOUS | Status: DC | PRN

## 2013-01-06 MED ORDER — PHENYLEPHRINE HCL 10 MG/ML IJ SOLN
INTRAMUSCULAR | Status: DC | PRN
  Administered 2013-01-06: 40 ug via INTRAVENOUS
  Administered 2013-01-06: 80 ug via INTRAVENOUS
  Administered 2013-01-06 (×4): 40 ug via INTRAVENOUS
  Administered 2013-01-06 (×2): 80 ug via INTRAVENOUS

## 2013-01-06 MED ORDER — NALBUPHINE SYRINGE 5 MG/0.5 ML
INJECTION | INTRAMUSCULAR | Status: AC
  Administered 2013-01-06: 10 mg via SUBCUTANEOUS
  Filled 2013-01-06: qty 1

## 2013-01-06 MED ORDER — CEFAZOLIN SODIUM-DEXTROSE 2-3 GM-% IV SOLR
2.0000 g | INTRAVENOUS | Status: AC
  Administered 2013-01-06: 2 g via INTRAVENOUS

## 2013-01-06 MED ORDER — SCOPOLAMINE 1 MG/3DAYS TD PT72
1.0000 | MEDICATED_PATCH | Freq: Once | TRANSDERMAL | Status: DC
  Administered 2013-01-06: 1.5 mg via TRANSDERMAL

## 2013-01-06 MED ORDER — MENTHOL 3 MG MT LOZG: 1.0000 | LOZENGE | OROMUCOSAL | Status: DC | PRN

## 2013-01-06 MED ORDER — BUPIVACAINE IN DEXTROSE 0.75-8.25 % IT SOLN
INTRATHECAL | Status: DC | PRN
  Administered 2013-01-06: 11 mg via INTRATHECAL

## 2013-01-06 MED ORDER — ACETAMINOPHEN 10 MG/ML IV SOLN
1000.0000 mg | Freq: Four times a day (QID) | INTRAVENOUS | Status: AC | PRN
  Administered 2013-01-06: 1000 mg via INTRAVENOUS
  Filled 2013-01-06: qty 100

## 2013-01-06 MED ORDER — FENTANYL CITRATE 0.05 MG/ML IJ SOLN
INTRAMUSCULAR | Status: AC
  Filled 2013-01-06: qty 2

## 2013-01-06 MED ORDER — ONDANSETRON HCL 4 MG/2ML IJ SOLN
INTRAMUSCULAR | Status: AC
  Filled 2013-01-06: qty 2

## 2013-01-06 MED ORDER — LACTATED RINGERS IV SOLN
INTRAVENOUS | Status: DC
  Administered 2013-01-06 – 2013-01-07 (×2): via INTRAVENOUS

## 2013-01-06 MED ORDER — ZOLPIDEM TARTRATE 5 MG PO TABS: 5.0000 mg | ORAL_TABLET | Freq: Every evening | ORAL | Status: DC | PRN

## 2013-01-06 MED ORDER — NALOXONE HCL 1 MG/ML IJ SOLN
1.0000 ug/kg/h | INTRAVENOUS | Status: DC | PRN
  Filled 2013-01-06: qty 2

## 2013-01-06 MED ORDER — IBUPROFEN 600 MG PO TABS
600.0000 mg | ORAL_TABLET | Freq: Four times a day (QID) | ORAL | Status: DC
  Administered 2013-01-07 – 2013-01-09 (×11): 600 mg via ORAL
  Filled 2013-01-06 (×11): qty 1

## 2013-01-06 MED ORDER — ONDANSETRON HCL 4 MG/2ML IJ SOLN
INTRAMUSCULAR | Status: DC | PRN
  Administered 2013-01-06: 4 mg via INTRAVENOUS

## 2013-01-06 MED ORDER — SCOPOLAMINE 1 MG/3DAYS TD PT72
1.0000 | MEDICATED_PATCH | Freq: Once | TRANSDERMAL | Status: DC
  Filled 2013-01-06: qty 1

## 2013-01-06 MED ORDER — DIPHENHYDRAMINE HCL 25 MG PO CAPS
25.0000 mg | ORAL_CAPSULE | ORAL | Status: DC | PRN
  Administered 2013-01-06: 25 mg via ORAL
  Filled 2013-01-06: qty 1

## 2013-01-06 MED ORDER — MORPHINE SULFATE (PF) 0.5 MG/ML IJ SOLN
INTRAMUSCULAR | Status: DC | PRN
  Administered 2013-01-06: .1 mg via INTRATHECAL

## 2013-01-06 MED ORDER — 0.9 % SODIUM CHLORIDE (POUR BTL) OPTIME
TOPICAL | Status: DC | PRN
  Administered 2013-01-06: 1000 mL

## 2013-01-06 MED ORDER — NALOXONE HCL 0.4 MG/ML IJ SOLN: 0.4000 mg | INTRAMUSCULAR | Status: DC | PRN

## 2013-01-06 MED ORDER — LACTATED RINGERS IV SOLN
INTRAVENOUS | Status: DC
  Administered 2013-01-06 (×2): via INTRAVENOUS
  Administered 2013-01-06: 125 mL/h via INTRAVENOUS

## 2013-01-06 MED ORDER — SCOPOLAMINE 1 MG/3DAYS TD PT72
MEDICATED_PATCH | TRANSDERMAL | Status: AC
  Administered 2013-01-06: 1.5 mg via TRANSDERMAL
  Filled 2013-01-06: qty 1

## 2013-01-06 MED ORDER — EPHEDRINE SULFATE 50 MG/ML IJ SOLN
INTRAMUSCULAR | Status: DC | PRN
  Administered 2013-01-06 (×2): 5 mg via INTRAVENOUS

## 2013-01-06 MED ORDER — ONDANSETRON HCL 4 MG/2ML IJ SOLN: 4.0000 mg | INTRAMUSCULAR | Status: DC | PRN

## 2013-01-06 MED ORDER — DIBUCAINE 1 % RE OINT: 1.0000 "application " | TOPICAL_OINTMENT | RECTAL | Status: DC | PRN

## 2013-01-06 MED ORDER — ONDANSETRON HCL 4 MG/2ML IJ SOLN: 4.0000 mg | Freq: Three times a day (TID) | INTRAMUSCULAR | Status: DC | PRN

## 2013-01-06 MED ORDER — MIDAZOLAM HCL 2 MG/2ML IJ SOLN: 0.5000 mg | Freq: Once | INTRAMUSCULAR | Status: DC | PRN

## 2013-01-06 MED ORDER — PROMETHAZINE HCL 25 MG/ML IJ SOLN: 6.2500 mg | INTRAMUSCULAR | Status: DC | PRN

## 2013-01-06 MED ORDER — VALACYCLOVIR HCL 500 MG PO TABS
500.0000 mg | ORAL_TABLET | Freq: Every day | ORAL | Status: DC
  Administered 2013-01-06 – 2013-01-09 (×4): 500 mg via ORAL
  Filled 2013-01-06 (×4): qty 1

## 2013-01-06 MED ORDER — OXYTOCIN 10 UNIT/ML IJ SOLN
INTRAMUSCULAR | Status: AC
  Filled 2013-01-06: qty 4

## 2013-01-06 MED ORDER — PHENYLEPHRINE 40 MCG/ML (10ML) SYRINGE FOR IV PUSH (FOR BLOOD PRESSURE SUPPORT)
PREFILLED_SYRINGE | INTRAVENOUS | Status: AC
  Filled 2013-01-06: qty 15

## 2013-01-06 MED ORDER — SENNOSIDES-DOCUSATE SODIUM 8.6-50 MG PO TABS
2.0000 | ORAL_TABLET | Freq: Every day | ORAL | Status: DC
  Administered 2013-01-06 – 2013-01-08 (×3): 2 via ORAL

## 2013-01-06 MED ORDER — METOCLOPRAMIDE HCL 5 MG/ML IJ SOLN: 10.0000 mg | Freq: Three times a day (TID) | INTRAMUSCULAR | Status: DC | PRN

## 2013-01-06 MED ORDER — SIMETHICONE 80 MG PO CHEW
80.0000 mg | CHEWABLE_TABLET | ORAL | Status: DC | PRN
  Administered 2013-01-08: 80 mg via ORAL

## 2013-01-06 MED ORDER — DIPHENHYDRAMINE HCL 50 MG/ML IJ SOLN
INTRAMUSCULAR | Status: AC
  Filled 2013-01-06: qty 1

## 2013-01-06 MED ORDER — NALBUPHINE SYRINGE 5 MG/0.5 ML
5.0000 mg | INJECTION | INTRAMUSCULAR | Status: DC | PRN
  Filled 2013-01-06: qty 1

## 2013-01-06 MED ORDER — FENTANYL CITRATE 0.05 MG/ML IJ SOLN
INTRAMUSCULAR | Status: DC | PRN
  Administered 2013-01-06: 25 ug via INTRATHECAL

## 2013-01-06 MED ORDER — DIPHENHYDRAMINE HCL 50 MG/ML IJ SOLN: 12.5000 mg | INTRAMUSCULAR | Status: DC | PRN

## 2013-01-06 SURGICAL SUPPLY — 47 items
BENZOIN TINCTURE PRP APPL 2/3 (GAUZE/BANDAGES/DRESSINGS) IMPLANT
BLADE EXTENDED COATED 6.5IN (ELECTRODE) IMPLANT
BLADE HEX COATED 2.75 (ELECTRODE) IMPLANT
BOOTIES KNEE HIGH SLOAN (MISCELLANEOUS) ×4 IMPLANT
CLOTH BEACON ORANGE TIMEOUT ST (SAFETY) ×2 IMPLANT
CONTAINER PREFILL 10% NBF 15ML (MISCELLANEOUS) IMPLANT
DERMABOND ADVANCED (GAUZE/BANDAGES/DRESSINGS)
DERMABOND ADVANCED .7 DNX12 (GAUZE/BANDAGES/DRESSINGS) IMPLANT
DRAIN JACKSON PRT FLT 7MM (DRAIN) IMPLANT
DRAPE LG THREE QUARTER DISP (DRAPES) ×2 IMPLANT
DRESSING TELFA 8X3 (GAUZE/BANDAGES/DRESSINGS) IMPLANT
DRSG OPSITE POSTOP 4X10 (GAUZE/BANDAGES/DRESSINGS) ×2 IMPLANT
DURAPREP 26ML APPLICATOR (WOUND CARE) ×2 IMPLANT
ELECT REM PT RETURN 9FT ADLT (ELECTROSURGICAL) ×2
ELECTRODE REM PT RTRN 9FT ADLT (ELECTROSURGICAL) ×1 IMPLANT
EVACUATOR SILICONE 100CC (DRAIN) IMPLANT
EXTRACTOR VACUUM KIWI (MISCELLANEOUS) IMPLANT
EXTRACTOR VACUUM M CUP 4 TUBE (SUCTIONS) IMPLANT
GAUZE SPONGE 4X4 12PLY STRL LF (GAUZE/BANDAGES/DRESSINGS) ×4 IMPLANT
GLOVE SURG SS PI 6.5 STRL IVOR (GLOVE) ×4 IMPLANT
GOWN PREVENTION PLUS LG XLONG (DISPOSABLE) ×8 IMPLANT
KIT ABG SYR 3ML LUER SLIP (SYRINGE) IMPLANT
NEEDLE HYPO 25X5/8 SAFETYGLIDE (NEEDLE) IMPLANT
NEEDLE SPNL 22GX3.5 QUINCKE BK (NEEDLE) ×2 IMPLANT
NS IRRIG 1000ML POUR BTL (IV SOLUTION) ×2 IMPLANT
PACK C SECTION WH (CUSTOM PROCEDURE TRAY) ×2 IMPLANT
PAD ABD 7.5X8 STRL (GAUZE/BANDAGES/DRESSINGS) ×2 IMPLANT
PAD OB MATERNITY 4.3X12.25 (PERSONAL CARE ITEMS) ×2 IMPLANT
SLEEVE SCD COMPRESS KNEE MED (MISCELLANEOUS) IMPLANT
SPONGE GAUZE 4X4 12PLY (GAUZE/BANDAGES/DRESSINGS) ×2 IMPLANT
STRIP CLOSURE SKIN 1/4X4 (GAUZE/BANDAGES/DRESSINGS) IMPLANT
SUT CHROMIC 2 0 SH (SUTURE) ×2 IMPLANT
SUT MNCRL AB 3-0 PS2 27 (SUTURE) ×2 IMPLANT
SUT SILK 0 FSL (SUTURE) IMPLANT
SUT VIC AB 0 CT1 27 (SUTURE) ×2
SUT VIC AB 0 CT1 27XBRD ANBCTR (SUTURE) ×2 IMPLANT
SUT VIC AB 0 CT1 36 (SUTURE) IMPLANT
SUT VIC AB 0 CTXB 36 (SUTURE) IMPLANT
SUT VIC AB 2-0 CT1 27 (SUTURE) ×1
SUT VIC AB 2-0 CT1 TAPERPNT 27 (SUTURE) ×1 IMPLANT
SUT VIC AB 2-0 SH 27 (SUTURE)
SUT VIC AB 2-0 SH 27XBRD (SUTURE) IMPLANT
SYR CONTROL 10ML LL (SYRINGE) ×2 IMPLANT
TAPE CLOTH SURG 4X10 WHT LF (GAUZE/BANDAGES/DRESSINGS) ×2 IMPLANT
TOWEL OR 17X24 6PK STRL BLUE (TOWEL DISPOSABLE) ×6 IMPLANT
TRAY FOLEY CATH 14FR (SET/KITS/TRAYS/PACK) ×2 IMPLANT
WATER STERILE IRR 1000ML POUR (IV SOLUTION) IMPLANT

## 2013-01-06 NOTE — Anesthesia Postprocedure Evaluation (Signed)
Anesthesia Post Note  Patient: Gabriela Le  Procedure(s) Performed: Procedure(s) (LRB): CESAREAN SECTION (N/A)  Anesthesia type: Spinal  Patient location: PACU  Post pain: Pain level controlled  Post assessment: Post-op Vital signs reviewed  Last Vitals:  Filed Vitals:   01/06/13 1115  BP: 105/73  Pulse: 84  Temp:   Resp: 24    Post vital signs: Reviewed  Level of consciousness: awake  Complications: No apparent anesthesia complications

## 2013-01-06 NOTE — Anesthesia Preprocedure Evaluation (Signed)
Anesthesia Evaluation  Patient identified by MRN, date of birth, ID band Patient awake    Reviewed: Allergy & Precautions, H&P , NPO status , Patient's Chart, lab work & pertinent test results  Airway Mallampati: II      Dental No notable dental hx.    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      H/O cold sores   ACYCLOVIR OINTMENT PRN Infertility associated with anovulation   HAS TAKEN CLOMID IN THE PAST    GERD (gastroesophageal reflux disease)    Reproductive/Obstetrics (+) Pregnancy                           Anesthesia Physical Anesthesia Plan  ASA: II  Anesthesia Plan: Spinal   Post-op Pain Management:    Induction:   Airway Management Planned:   Additional Equipment:   Intra-op Plan:   Post-operative Plan:   Informed Consent: I have reviewed the patients History and Physical, chart, labs and discussed the procedure including the risks, benefits and alternatives for the proposed anesthesia with the patient or authorized representative who has indicated his/her understanding and acceptance.     Plan Discussed with: Anesthesiologist, CRNA and Surgeon  Anesthesia Plan Comments:         Anesthesia Quick Evaluation

## 2013-01-06 NOTE — Anesthesia Postprocedure Evaluation (Signed)
Anesthesia Post Note  Patient: Gabriela Le  Procedure(s) Performed: Procedure(s) (LRB): CESAREAN SECTION (N/A)  Anesthesia type: Spinal  Patient location: Mother/Baby  Post pain: Pain level controlled  Post assessment: Post-op Vital signs reviewed  Last Vitals:  Filed Vitals:   01/06/13 1535  BP: 109/64  Pulse: 84  Temp: 36.7 C  Resp: 18    Post vital signs: Reviewed  Level of consciousness: awake  Complications: No apparent anesthesia complications

## 2013-01-06 NOTE — Progress Notes (Signed)
Use Clemens Catholic as translator in [place].

## 2013-01-06 NOTE — Op Note (Signed)
Cesarean Section Procedure Note  Indications: malpresentation: Homero Fellers breech  Pre-operative Diagnosis: 39 week 2 day pregnancy.  Post-operative Diagnosis: same  Surgeon: Hal Morales  First Assistant:  Surgeon: Hal Morales   Assistants: Nigel Bridgeman certified nurse midwife  Anesthesia: Spinal anesthesia  ASA Class: 2  Procedure Details  The patient was seen in the Holding Room. The risks, benefits, complications, treatment options, and expected outcomes were discussed with the patient via her Arabic interpreter.  The patient concurred with the proposed plan, giving informed consent.  The site of surgery properly noted/marked. The patient was taken to Operating Room # 9, identified as [facility] and the procedure verified as C-Section Delivery. A Time Out was held and the above information confirmed.  After induction of anesthesia, the patient was  prepped with chlor prep in the usual sterile manner.A foley catheter was placed under sterile conditions.  The patient was then draped in the usual fashion.   Suprapubicsubcutaneous injection of 0.25% Bupivacaine   A Pfannenstiel incision was made and carried down through the subcutaneous tissue to the fascia. Fascial incision was made and extended transversely. The fascia was separated from the underlying rectus tissue superiorly and inferiorly. The peritoneum was identified and entered. Peritoneal incision was extended longitudinally.  The bladder blade was placed.   A low transverse uterine incision was made two cm above the uterovesical fold, and that incision extended transversely bluntly.The infant was  delivered from right sacrum. Anterior presentation was a living female with Apgar scores of 8 at one minute and 9 at five minutes. After the umbilical cord was clamped and cut cord blood was obtained for evaluation. The placenta was removed intact and appeared normal. The uterine outline, tubes and ovaries appeared norma for the  gravid state. The uterine incision was closed with running locked sutures of 0 Vicryl. An imbricating layer of sutures was placed. Hemostasis was observed. Lavage was carried out until clear. The peritoneum was closed with a running suture of 2-0 Vicryl.  The rectus muscles were reapproximated with a figure of 8 suture of 2-0 Vicryl.  The fascia was then reapproximated with a running sutures of 0 Vicryl .Renforcing figure of 8 sutures of 0Vicryl were placed on either side of midline.   The skin was reapproximated with 3-0 moncryl.  A sterile dressing was applied.  Instrument, sponge, and needle counts were correct prior to the abdominal closure and at the conclusion of the case. The infant remained in the operating room for skin for skin bonding with anticipated admission to the full-term nursery  Findings:  Placenta contained a 3vessel cord    Estimated Blood Loss:  500 cc         Drains: None         Total IV Fluids:         Specimens: Placenta sent to birthing suite         Implants: None         Complications: ::"None; patient tolerated the procedure well."         Disposition: PACU - hemodynamically stable.         Condition: stable  Attending Attestation: I performed the procedure.  Daven Pinckney P  01/06/2013 10:36 AM

## 2013-01-06 NOTE — Addendum Note (Signed)
Addendum  created 01/06/13 1628 by Algis Greenhouse, CRNA   Modules edited:Notes Section

## 2013-01-06 NOTE — Transfer of Care (Signed)
Immediate Anesthesia Transfer of Care Note  Patient: Gabriela Le  Procedure(s) Performed: Procedure(s) (LRB) with comments: CESAREAN SECTION (N/A) - Primary  Patient Location: PACU  Anesthesia Type:Spinal  Level of Consciousness: awake, alert  and oriented  Airway & Oxygen Therapy: Patient Spontanous Breathing  Post-op Assessment: Report given to PACU RN and Post -op Vital signs reviewed and stable  Post vital signs: Reviewed and stable  Complications: No apparent anesthesia complications

## 2013-01-06 NOTE — Anesthesia Procedure Notes (Signed)
Spinal  Patient location during procedure: OR Start time: 01/06/2013 9:17 AM Staffing Anesthesiologist: Angus Seller., Harrell Gave. Performed by: anesthesiologist  Preanesthetic Checklist Completed: patient identified, site marked, surgical consent, pre-op evaluation, timeout performed, IV checked, risks and benefits discussed and monitors and equipment checked Spinal Block Patient position: sitting Prep: DuraPrep Patient monitoring: heart rate, cardiac monitor, continuous pulse ox and blood pressure Approach: midline Location: L3-4 Injection technique: single-shot Needle Needle type: Sprotte  Needle gauge: 24 G Needle length: 9 cm Assessment Sensory level: T4 Additional Notes Patient identified.  Risk benefits discussed including failed block, incomplete pain control, headache, nerve damage, paralysis, blood pressure changes, nausea, vomiting, reactions to medication both toxic or allergic, and postpartum back pain.  Patient expressed understanding and wished to proceed.  All questions were answered.  Sterile technique used throughout procedure.  CSF was clear.  No parasthesia or other complications.  Please see nursing notes for vital signs.

## 2013-01-07 LAB — CBC
Hemoglobin: 9.7 g/dL — ABNORMAL LOW (ref 12.0–15.0)
MCH: 31.5 pg (ref 26.0–34.0)
MCHC: 33.8 g/dL (ref 30.0–36.0)
Platelets: 150 10*3/uL (ref 150–400)

## 2013-01-07 NOTE — Progress Notes (Signed)
During examination honeycomb dressing was found to be saturated with sanguinous fluid. Pressure dressing clean and dry. Pressure dressing and honeycomb dressing removed per protocol. 2 steri strips placed over area that was slightly apart. No new drainage noted. New honeycomb dressing applied, still no new drainage noted. Pt tolerated procedure well.

## 2013-01-07 NOTE — Progress Notes (Signed)
Subjective: Postpartum Day 1: Cesarean Delivery Patient reports feeling well. Having some itching.  Ambulating and voiding without difficulty.  Tol po liquids and solids without difficulty.  Working on breastfeeding.  Pain well controlled on medications.  Denies weakness or dizziness.  No flatus or BM yet.  Objective: Vital signs in last 24 hours: Temp:  [97.8 F (36.6 C)-98.9 F (37.2 C)] 98.3 F (36.8 C) (01/11 1120) Pulse Rate:  [79-105] 87  (01/11 1120) Resp:  [16-22] 16  (01/11 1120) BP: (90-118)/(53-75) 95/59 mmHg (01/11 1120) SpO2:  [97 %-99 %] 98 % (01/11 1120)  Physical Exam:  General: alert, cooperative and no distress Heart:  RRR Lungs:  CTA bilat Breasts:  Soft Abd:  Soft, NT with pos BS x 4 quads Lochia: appropriate sm rubra Uterine Fundus: firm, NT @ umbilicus Incision: healing well, no significant drainage. Honeycomb dsg clean/dry/intact. DVT Evaluation: No evidence of DVT seen on physical exam. Negative Homan's sign bilat. No significant calf/ankle edema.   Basename 01/07/13 0528  HGB 9.7*  HCT 28.7*    Assessment/Plan: Status post Cesarean section. Doing well postoperatively.  Asymptomatic anemia  Continue current care.  Gabriela Le O. 01/07/2013, 12:33 PM

## 2013-01-08 LAB — CBC WITH DIFFERENTIAL/PLATELET
Basophils Absolute: 0 10*3/uL (ref 0.0–0.1)
Basophils Relative: 0 % (ref 0–1)
Eosinophils Absolute: 0.1 10*3/uL (ref 0.0–0.7)
Eosinophils Relative: 1 % (ref 0–5)
Lymphocytes Relative: 23 % (ref 12–46)
MCV: 92.9 fL (ref 78.0–100.0)
Platelets: 156 10*3/uL (ref 150–400)
RDW: 14.7 % (ref 11.5–15.5)
WBC: 13.3 10*3/uL — ABNORMAL HIGH (ref 4.0–10.5)

## 2013-01-08 NOTE — Progress Notes (Signed)
Patient ID: Gabriela Le, female   DOB: 27-Feb-1976, 37 y.o.   MRN: [id number] Post Partum Day 1 Subjective: up ad lib, voiding, tolerating PO and c/o pain in both breasts with infant latching on  Objective: Blood pressure 95/50, pulse 75, temperature 98.7 F (37.1 C), temperature source Oral, resp. rate 18, height 5\' 2"  (1.575 m), weight 123 lb (55.792 kg), last menstrual period 04/06/2012, SpO2 98.00%,  currently breastfeeding. Tmax last night 100.1 Physical Exam:  General: alert, cooperative, appears stated age and no distress Lochia: appropriate Uterine Fundus: firm and moderately tender Incision: healing well DVT Evaluation: No evidence of DVT seen on physical exam.   Basename 01/07/13 0528  HGB 9.7*  HCT 28.7*    Assessment/Plan: Plan for discharge tomorrow and Lactation consult CBC with diff today.   Watch temp carefully   LOS: 2 days   Antara Brecheisen P 01/08/2013, 7:35 AM  \

## 2013-01-09 ENCOUNTER — Encounter (HOSPITAL_COMMUNITY): Payer: Self-pay | Admitting: Obstetrics and Gynecology

## 2013-01-09 LAB — CBC WITH DIFFERENTIAL/PLATELET
Basophils Absolute: 0 10*3/uL (ref 0.0–0.1)
HCT: 29.6 % — ABNORMAL LOW (ref 36.0–46.0)
Lymphocytes Relative: 30 % (ref 12–46)
Neutro Abs: 6 10*3/uL (ref 1.7–7.7)
Neutrophils Relative %: 61 % (ref 43–77)
Platelets: 176 10*3/uL (ref 150–400)
RDW: 14.6 % (ref 11.5–15.5)
WBC: 9.8 10*3/uL (ref 4.0–10.5)

## 2013-01-09 MED ORDER — OXYCODONE-ACETAMINOPHEN 5-325 MG PO TABS: 1.0000 | ORAL_TABLET | ORAL | Status: DC | PRN

## 2013-01-09 MED ORDER — IBUPROFEN 600 MG PO TABS: 600.0000 mg | ORAL_TABLET | Freq: Four times a day (QID) | ORAL | Status: DC | PRN

## 2013-01-09 NOTE — Discharge Summary (Signed)
Physician Discharge Summary  Patient ID: Gabriela Le MRN: [id number] DOB/AGE: Nov 02, 1976 37 y.o.  Admit date: 01/06/2013 Discharge date: 01/09/2013  Admission Diagnoses: [redacted]w[redacted]d  Breech presentation   Discharge Diagnoses:  Principal Problem:  *Status post primary low transverse cesarean section anemia  Discharged Condition: stable  Hospital Course: [redacted]w[redacted]d  Breech presentation, primary cesarean section, anemia, normal involution, lactating, no plans for birth control  Consults: None  Treatments: IV hydration  Discharge Exam: Blood pressure 92/62, pulse 73, temperature 98 F (36.7 C), temperature source Oral, resp. rate 18, height 5\' 2"  (1.575 m), weight 123 lb (55.792 kg), last menstrual period 04/06/2012, SpO2 98.00%, unknown if currently breastfeeding. Subjective: Postpartum Day 3 Cesarean Delivery Patient reports tolerating PO, + flatus, + BM and no problems voiding pain meds are working more pain here with moving (points to RLQ.   S: comfortable, little bleeding, slept     breastfeeding Hemoglobin & Hematocrit     Component Value Date/Time   HGB 9.7* 01/09/2013 0540   HCT 29.6* 01/09/2013 0540    Temp:  [98 F (36.7 C)-99.4 F (37.4 C)] 98 F (36.7 C) (01/13 0551) Pulse Rate:  [73-90] 73  (01/13 0551) Resp:  [18] 18  (01/13 0551) BP: (92-114)/(62-70) 92/62 mmHg (01/13 0551)  Physical Exam:  General: alert, cooperative and no distress Lochia: appropriate Uterine Fundus: firm Incision: with dry intact no redness DVT Evaluation: Negative Homan's sign. No significant calf/ankle edema. Lungs clear bilaterally AP RRR Bowel sounds active abd       nontympanic     Disposition: 01-Home or Self Care  Discharge Orders    Future Orders Please Complete By Expires   Discharge instructions      Scheduling Instructions:   Over the counter iron 1 daily do not take at same time as prenatal vitamin Take stool softner or mirilax 2 daily   Discharge patient      Comments:   RX percocet by Dr. Estanislado Pandy       Medication List     As of 01/09/2013  9:46 AM    STOP taking these medications         valACYclovir 500 MG tablet   Commonly known as: VALTREX      Vitafol-OB Tabs      TAKE these medications         ibuprofen 600 MG tablet   Commonly known as: ADVIL,MOTRIN   Take 1 tablet (600 mg total) by mouth every 6 (six) hours as needed for pain.      ranitidine 150 MG tablet   Commonly known as: ZANTAC   Take 150 mg by mouth 2 (two) times daily as needed. For upset stomach             RX percocet by Dr. Estanislado Pandy        Iron and stool softner or mirilax 2 daily     Follow-up Information    Follow up with [facility]. In 6 weeks.   Contact information:   [address] [place] [address] [phone]       s/s po to report reviewed.  SignedLavera Guise 01/09/2013, 9:46 AM

## 2013-01-26 ENCOUNTER — Ambulatory Visit: Payer: 59 | Admitting: Obstetrics and Gynecology

## 2013-01-26 ENCOUNTER — Encounter: Payer: Self-pay | Admitting: Obstetrics and Gynecology

## 2013-01-26 VITALS — BP 94/62 | Temp 98.6°F | Wt 116.0 lb

## 2013-01-26 DIAGNOSIS — L7682 Other postprocedural complications of skin and subcutaneous tissue: Secondary | ICD-10-CM

## 2013-01-26 MED ORDER — LIDOCAINE 5 % EX PTCH: MEDICATED_PATCH | CUTANEOUS | Status: DC

## 2013-01-26 NOTE — Patient Instructions (Signed)
Apply hot water bottle over clothes on abdomen for comfort

## 2013-01-26 NOTE — Progress Notes (Signed)
37 YO S/P C-section 1/10/214 with complaints of incisional pain and a "hard place", feels like rocks along with fever inside though her temperature is normal. This all started a week after C-section. Denies incision separation or drainage.  Accompanied by husband who is translating and says that she sometimes has burning and sticking pain around incision.   O: Abdomen: Incision well healed without evidence of infection;  right superior incision edge with slight overlap but healed and palpable firm       scar tissue below skin  A:  Incision check-S/P C-section 01/06/2013  P:  Described and demonstrated the use of a hot water bottle with hot water from sink to be placed over clothing       in the abdominal area       Lidoderm Patches #10  apply above incision edge [pointed out area to patient and her husband] every 12 hours as needed      for pain       RTO- is symptoms worsen    Axtyn Woehler, PA-C

## 2013-02-13 ENCOUNTER — Encounter: Payer: 59 | Admitting: Obstetrics and Gynecology

## 2013-03-08 ENCOUNTER — Ambulatory Visit (INDEPENDENT_AMBULATORY_CARE_PROVIDER_SITE_OTHER): Payer: Medicaid Other | Admitting: Internal Medicine

## 2013-03-08 ENCOUNTER — Encounter: Payer: Self-pay | Admitting: Internal Medicine

## 2013-03-08 VITALS — BP 110/87 | HR 83 | Temp 98.4°F | Resp 16 | Wt 117.8 lb

## 2013-03-08 DIAGNOSIS — J329 Chronic sinusitis, unspecified: Secondary | ICD-10-CM

## 2013-03-08 DIAGNOSIS — L500 Allergic urticaria: Secondary | ICD-10-CM

## 2013-03-08 MED ORDER — CEFUROXIME AXETIL 500 MG PO TABS: 500.0000 mg | ORAL_TABLET | Freq: Two times a day (BID) | ORAL | Status: DC

## 2013-03-08 MED ORDER — METHYLPREDNISOLONE ACETATE 80 MG/ML IJ SUSP
80.0000 mg | Freq: Once | INTRAMUSCULAR | Status: AC
  Administered 2013-03-08: 80 mg via INTRAMUSCULAR

## 2013-03-08 MED ORDER — LORATADINE 10 MG PO TABS: 10.0000 mg | ORAL_TABLET | Freq: Every day | ORAL | Status: DC | PRN

## 2013-03-08 MED ORDER — TRIAMCINOLONE ACETONIDE 0.5 % EX CREA: TOPICAL_CREAM | Freq: Three times a day (TID) | CUTANEOUS | Status: DC

## 2013-03-09 NOTE — Progress Notes (Signed)
  Subjective:    Patient ID: Gabriela Le, female    DOB: [birth year], 37 y.o.   MRN: [id number]  Rash This is a recurrent problem. The current episode started yesterday. The problem is unchanged. The affected locations include the right arm. The rash is characterized by redness, swelling and itchiness. She was exposed to nothing. Associated symptoms include congestion. Pertinent negatives include no cough or fatigue. Past treatments include nothing. There is no history of allergies or eczema.  Sinusitis This is a new problem. The current episode started 1 to 4 weeks ago. The problem is unchanged. There has been no fever. The pain is mild. Associated symptoms include congestion and sinus pressure. Pertinent negatives include no chills, coughing or headaches. (Nasal colored d/c) Past treatments include nothing.  Ear Fullness  There is pain in the right ear. This is a new problem. The current episode started 1 to 4 weeks ago. Associated symptoms include a rash. Pertinent negatives include no abdominal pain, coughing or headaches.  She had the same rash exactly 1 month ago She has been breat feeding    Review of Systems  Constitutional: Negative for chills, activity change, appetite change, fatigue and unexpected weight change.  HENT: Positive for congestion and sinus pressure. Negative for mouth sores.   Eyes: Negative for visual disturbance.  Respiratory: Negative for cough and chest tightness.   Gastrointestinal: Negative for nausea and abdominal pain.  Genitourinary: Negative for frequency, difficulty urinating and vaginal pain.  Musculoskeletal: Negative for back pain and gait problem.  Skin: Positive for rash. Negative for pallor.  Neurological: Negative for dizziness, tremors, weakness, numbness and headaches.  Psychiatric/Behavioral: Negative for confusion and sleep disturbance.       Objective:   Physical Exam  Constitutional: She appears well-developed. No distress.  HENT:  Head:  Normocephalic.  Right Ear: External ear normal.  Left Ear: External ear normal.  Nose: Nose normal.  Mouth/Throat: Oropharynx is clear and moist.  Eyes: Conjunctivae are normal. Pupils are equal, round, and reactive to light. Right eye exhibits no discharge. Left eye exhibits no discharge.  Neck: Normal range of motion. Neck supple. No JVD present. No tracheal deviation present. No thyromegaly present.  Cardiovascular: Normal rate, regular rhythm and normal heart sounds.   Pulmonary/Chest: No stridor. No respiratory distress. She has no wheezes.  Abdominal: Soft. Bowel sounds are normal. She exhibits no distension and no mass. There is no tenderness. There is no rebound and no guarding.  Musculoskeletal: She exhibits no edema and no tenderness.  Lymphadenopathy:    She has no cervical adenopathy.  Neurological: She displays normal reflexes. No cranial nerve deficit. She exhibits normal muscle tone. Coordination normal.  Skin: Rash (8-10 large hives on R arm) noted. No erythema.  Psychiatric: She has a normal mood and affect. Her behavior is normal. Judgment and thought content normal.  fluid behind R TM eryth nasal mucosa        Assessment & Plan:

## 2013-03-09 NOTE — Assessment & Plan Note (Signed)
Ceftin x10 d 

## 2013-03-09 NOTE — Assessment & Plan Note (Signed)
3/14 recurrent Depomedrol IM Loratidine Triamcinolone Treat sinusitis

## 2013-03-09 NOTE — Assessment & Plan Note (Signed)
She is on a MVI

## 2013-03-10 ENCOUNTER — Ambulatory Visit: Payer: 59 | Admitting: Internal Medicine

## 2013-03-13 ENCOUNTER — Encounter: Payer: Self-pay | Admitting: Obstetrics and Gynecology

## 2013-03-13 ENCOUNTER — Ambulatory Visit: Payer: 59 | Admitting: Obstetrics and Gynecology

## 2013-03-13 VITALS — BP 94/62 | Temp 97.9°F | Wt 119.0 lb

## 2013-03-13 DIAGNOSIS — D649 Anemia, unspecified: Secondary | ICD-10-CM

## 2013-03-13 LAB — HEMOGLOBIN: Hemoglobin: 12.6 g/dL (ref 12.0–15.0)

## 2013-03-13 MED ORDER — PRENATAL VITAMINS PLUS 27-1 MG PO TABS: 1.0000 | ORAL_TABLET | Freq: Every day | ORAL | Status: DC

## 2013-03-13 NOTE — Progress Notes (Signed)
Subjective:   Date of delivery: 01/06/13 Female Name: Gabriela Le  Vaginal delivery:no Cesarean section:yes Tubal ligation:no GDM:no Breast Feeding:yes Bottle Feeding:no Post-Partum Blues:no Abnormal pap:no Normal GU function: yes Normal GI function:yes Returning to work:no EPDS: 3  Gabriela Le is a 37 y.o. female who presents for a postpartum visit.  I have fully reviewed the prenatal and intrapartum course.  Pt has had 2 episodes of right arm rash, erythematous and bullous treated by her primary with antihistamines and topical steroids with good relief.   Patient is not sexually active.   The following portions of the patient's history were reviewed and updated as appropriate: allergies, current medications, past family history, past medical history, past social history, past surgical history and problem list.  Review of Systems Pertinent items are noted in HPI.   Objective:    BP 94/62  Temp(Src) 97.9 F (36.6 C) (Oral)  Wt 119 lb (53.978 kg)  BMI 21.76 kg/m2  Breastfeeding? Yes  General:  alert, cooperative and no distress     Lungs: clear to auscultation bilaterally  Heart:  regular rate and rhythm, S1, S2 normal, no murmur  Abdomen: soft, non-tender; bowel sounds normal; no masses,  no organomegaly.  Incision well healed   Vulva:  normal.  Female circumcision  Vagina: normal vagina  Cervix:  normal  Corpus: normal size, contour, position, consistency, mobility, non-tender  Adnexa:  normal adnexa             Assessment:     Normal postpartum exam.  Pap smear not done at today's visit. Due 06/2013 Anemia  Plan:  Declines contraception hgb aex 06/2013  Dierdre Forth MD 03/13/2013 4:28 PM

## 2013-08-15 ENCOUNTER — Ambulatory Visit (INDEPENDENT_AMBULATORY_CARE_PROVIDER_SITE_OTHER): Payer: 59 | Admitting: Internal Medicine

## 2013-08-15 ENCOUNTER — Encounter: Payer: Self-pay | Admitting: Internal Medicine

## 2013-08-15 VITALS — BP 100/70 | HR 80 | Temp 98.4°F | Resp 16 | Wt 119.0 lb

## 2013-08-15 DIAGNOSIS — R1012 Left upper quadrant pain: Secondary | ICD-10-CM

## 2013-08-15 DIAGNOSIS — H6590 Unspecified nonsuppurative otitis media, unspecified ear: Secondary | ICD-10-CM

## 2013-08-15 DIAGNOSIS — L500 Allergic urticaria: Secondary | ICD-10-CM

## 2013-08-15 NOTE — Assessment & Plan Note (Signed)
Resolved

## 2013-08-15 NOTE — Progress Notes (Signed)
   Subjective:    HPI C/o fullness sensation under L ribs w/exercising only x 3 wks C/o L ear itching F/u hives - resolved She has been breat feeding    Review of Systems  Constitutional: Negative for activity change, appetite change and unexpected weight change.  HENT: Negative for mouth sores.   Eyes: Negative for visual disturbance.  Respiratory: Negative for chest tightness.   Gastrointestinal: Negative for nausea.  Genitourinary: Negative for frequency, difficulty urinating and vaginal pain.  Musculoskeletal: Negative for back pain and gait problem.  Skin: Negative for pallor.  Neurological: Negative for dizziness, tremors, weakness and numbness.  Psychiatric/Behavioral: Negative for confusion and sleep disturbance.       Objective:   Physical Exam  Constitutional: She appears well-developed. No distress.  HENT:  Head: Normocephalic.  Right Ear: External ear normal.  Left Ear: External ear normal.  Nose: Nose normal.  Mouth/Throat: Oropharynx is clear and moist.  Eyes: Conjunctivae are normal. Pupils are equal, round, and reactive to light. Right eye exhibits no discharge. Left eye exhibits no discharge.  Neck: Normal range of motion. Neck supple. No JVD present. No tracheal deviation present. No thyromegaly present.  Cardiovascular: Normal rate, regular rhythm and normal heart sounds.   Pulmonary/Chest: No stridor. No respiratory distress. She has no wheezes.  Abdominal: Soft. Bowel sounds are normal. She exhibits no distension and no mass. There is no tenderness. There is no rebound and no guarding.  Musculoskeletal: She exhibits no edema and no tenderness.  Lymphadenopathy:    She has no cervical adenopathy.  Neurological: She displays normal reflexes. No cranial nerve deficit. She exhibits normal muscle tone. Coordination normal.  Skin: No rash noted. She is not diaphoretic. No erythema.  Psychiatric: She has a normal mood and affect. Her behavior is normal.  Judgment and thought content normal.  ? fluid behind R TM LUQ: no mass, NT, no hernia, lipoma, HSM         Assessment & Plan:

## 2013-08-15 NOTE — Assessment & Plan Note (Signed)
Continue with current prescription therapy as reflected on the Med list.  

## 2013-08-15 NOTE — Assessment & Plan Note (Signed)
Claritin 10 mg qd prn

## 2013-08-15 NOTE — Assessment & Plan Note (Addendum)
8/14 it is "fullness:" w/exercise - nl exam. No other sx's Options discussed. [place] if not better

## 2013-09-04 ENCOUNTER — Ambulatory Visit (INDEPENDENT_AMBULATORY_CARE_PROVIDER_SITE_OTHER)
Admission: RE | Admit: 2013-09-04 | Discharge: 2013-09-04 | Disposition: A | Discharge status: Home / Self Care | Payer: 59 | Source: Ambulatory Visit | Attending: Internal Medicine | Admitting: Internal Medicine

## 2013-09-04 ENCOUNTER — Encounter: Payer: Self-pay | Admitting: Internal Medicine

## 2013-09-04 ENCOUNTER — Ambulatory Visit (INDEPENDENT_AMBULATORY_CARE_PROVIDER_SITE_OTHER): Payer: 59 | Admitting: Internal Medicine

## 2013-09-04 VITALS — BP 130/90 | HR 71 | Temp 97.7°F | Wt 119.0 lb

## 2013-09-04 DIAGNOSIS — M25519 Pain in unspecified shoulder: Secondary | ICD-10-CM

## 2013-09-04 DIAGNOSIS — R1012 Left upper quadrant pain: Secondary | ICD-10-CM

## 2013-09-04 DIAGNOSIS — M79609 Pain in unspecified limb: Secondary | ICD-10-CM

## 2013-09-04 DIAGNOSIS — M25511 Pain in right shoulder: Secondary | ICD-10-CM

## 2013-09-04 DIAGNOSIS — M79601 Pain in right arm: Secondary | ICD-10-CM

## 2013-09-04 MED ORDER — PREDNISONE 10 MG PO TABS: ORAL_TABLET | ORAL | Status: DC

## 2013-09-04 MED ORDER — TRAMADOL HCL 50 MG PO TABS: 50.0000 mg | ORAL_TABLET | Freq: Three times a day (TID) | ORAL | Status: DC | PRN

## 2013-09-04 NOTE — Assessment & Plan Note (Signed)
R shoulder 9/14 He had a R shoulder X ray 3 mo ago - Dr Yisroel Ramming  Neck x ray Prednisone 10 mg: take 4 tabs a day x 3 days; then 3 tabs a day x 4 days; then 2 tabs a day x 4 days, then 1 tab a day x 6 days, then stop. Take pc. Tramadol prn

## 2013-09-04 NOTE — Assessment & Plan Note (Signed)
Will sch US 

## 2013-09-04 NOTE — Progress Notes (Signed)
   Subjective:    Shoulder Pain  The pain is present in the right hand, right arm and right shoulder. This is a new problem. The current episode started 1 to 4 weeks ago. There has been no history of extremity trauma. The pain is severe. Associated symptoms include tingling. Pertinent negatives include no numbness. Family history does not include rheumatoid arthritis. There is no history of diabetes.  Arm Pain  The incident occurred more than 1 week ago. There was no injury mechanism. The pain is present in the right elbow, right fingers, right hand, right forearm and right shoulder. The quality of the pain is described as burning and shooting. The pain is severe. The pain has been intermittent since the incident. Associated symptoms include tingling. Pertinent negatives include no muscle weakness or numbness. She has tried acetaminophen and NSAIDs for the symptoms. The treatment provided mild relief.   C/o fullness sensation under L ribs w/exercising only off and on x 4 wks F/u hives - resolved She has stopped breast feeding    Review of Systems  Constitutional: Negative for activity change, appetite change and unexpected weight change.  HENT: Negative for mouth sores.   Eyes: Negative for visual disturbance.  Respiratory: Negative for chest tightness.   Gastrointestinal: Negative for nausea.  Genitourinary: Negative for frequency, difficulty urinating and vaginal pain.  Musculoskeletal: Negative for back pain and gait problem.  Skin: Negative for pallor.  Neurological: Positive for tingling. Negative for dizziness, tremors, weakness and numbness.  Psychiatric/Behavioral: Negative for confusion and sleep disturbance.       Objective:   Physical Exam  Constitutional: She appears well-developed. No distress.  HENT:  Head: Normocephalic.  Right Ear: External ear normal.  Left Ear: External ear normal.  Nose: Nose normal.  Mouth/Throat: Oropharynx is clear and moist.  Eyes:  Conjunctivae are normal. Pupils are equal, round, and reactive to light. Right eye exhibits no discharge. Left eye exhibits no discharge.  Neck: Normal range of motion. Neck supple. No JVD present. No tracheal deviation present. No thyromegaly present.  Cardiovascular: Normal rate, regular rhythm and normal heart sounds.   Pulmonary/Chest: No stridor. No respiratory distress. She has no wheezes.  Abdominal: Soft. Bowel sounds are normal. She exhibits no distension and no mass. There is no tenderness. There is no rebound and no guarding.  Musculoskeletal: She exhibits tenderness. She exhibits no edema.  R shoulder and R trap are tender w/ROM Subacr is tender MS 5/5 DTRs ok  Lymphadenopathy:    She has no cervical adenopathy.  Neurological: She displays normal reflexes. No cranial nerve deficit. She exhibits normal muscle tone. Coordination normal.  Skin: No rash noted. She is not diaphoretic. No erythema.  Psychiatric: She has a normal mood and affect. Her behavior is normal. Judgment and thought content normal.   LUQ: no mass, NT, no hernia, lipoma, HSM         Assessment & Plan:

## 2013-09-08 ENCOUNTER — Other Ambulatory Visit: Payer: 59

## 2013-09-11 ENCOUNTER — Ambulatory Visit
Admission: RE | Admit: 2013-09-11 | Discharge: 2013-09-11 | Disposition: A | Discharge status: Home / Self Care | Payer: 59 | Source: Ambulatory Visit | Attending: Internal Medicine | Admitting: Internal Medicine

## 2013-09-13 ENCOUNTER — Other Ambulatory Visit: Payer: Self-pay | Admitting: Internal Medicine

## 2013-09-13 ENCOUNTER — Ambulatory Visit
Admission: RE | Admit: 2013-09-13 | Discharge: 2013-09-13 | Disposition: A | Discharge status: Home / Self Care | Payer: 59 | Source: Ambulatory Visit | Attending: Internal Medicine | Admitting: Internal Medicine

## 2013-09-13 DIAGNOSIS — R1012 Left upper quadrant pain: Secondary | ICD-10-CM

## 2013-09-19 ENCOUNTER — Ambulatory Visit (INDEPENDENT_AMBULATORY_CARE_PROVIDER_SITE_OTHER): Payer: 59 | Admitting: Internal Medicine

## 2013-09-19 ENCOUNTER — Encounter: Payer: Self-pay | Admitting: Internal Medicine

## 2013-09-19 VITALS — BP 100/70 | HR 80 | Temp 98.1°F | Resp 16 | Wt 118.0 lb

## 2013-09-19 DIAGNOSIS — R1012 Left upper quadrant pain: Secondary | ICD-10-CM

## 2013-09-19 DIAGNOSIS — M25519 Pain in unspecified shoulder: Secondary | ICD-10-CM

## 2013-09-19 DIAGNOSIS — L500 Allergic urticaria: Secondary | ICD-10-CM

## 2013-09-19 NOTE — Assessment & Plan Note (Signed)
Resolved Discussed movements

## 2013-09-20 ENCOUNTER — Encounter: Payer: Self-pay | Admitting: Internal Medicine

## 2013-09-20 NOTE — Assessment & Plan Note (Signed)
No relapse 

## 2013-09-20 NOTE — Progress Notes (Signed)
   Subjective:    Shoulder Pain  The pain is present in the right hand, right arm and right shoulder. This is a new problem. The current episode started 1 to 4 weeks ago. There has been no history of extremity trauma. The problem has been rapidly improving. The patient is experiencing no pain. Pertinent negatives include no numbness or tingling. Family history does not include rheumatoid arthritis. There is no history of diabetes.  Arm Pain  The incident occurred more than 1 week ago. There was no injury mechanism. The pain is present in the right elbow, right fingers, right hand, right forearm and right shoulder. The quality of the pain is described as burning and shooting. The patient is experiencing no pain. Pertinent negatives include no muscle weakness, numbness or tingling. She has tried acetaminophen and NSAIDs for the symptoms. The treatment provided significant relief.   C/o fullness sensation under L ribs w/exercising only off and on x 8 wks F/u hives - resolved She has stopped breast feeding    Review of Systems  Constitutional: Negative for activity change, appetite change and unexpected weight change.  HENT: Negative for mouth sores.   Eyes: Negative for visual disturbance.  Respiratory: Negative for chest tightness.   Gastrointestinal: Negative for nausea.  Genitourinary: Negative for frequency, difficulty urinating and vaginal pain.  Musculoskeletal: Negative for back pain and gait problem.  Skin: Negative for pallor.  Neurological: Negative for dizziness, tingling, tremors, weakness and numbness.  Psychiatric/Behavioral: Negative for confusion and sleep disturbance.       Objective:   Physical Exam  Constitutional: She appears well-developed. No distress.  HENT:  Head: Normocephalic.  Right Ear: External ear normal.  Left Ear: External ear normal.  Nose: Nose normal.  Mouth/Throat: Oropharynx is clear and moist.  Eyes: Conjunctivae are normal. Pupils are equal,  round, and reactive to light. Right eye exhibits no discharge. Left eye exhibits no discharge.  Neck: Normal range of motion. Neck supple. No JVD present. No tracheal deviation present. No thyromegaly present.  Cardiovascular: Normal rate, regular rhythm and normal heart sounds.   Pulmonary/Chest: No stridor. No respiratory distress. She has no wheezes.  Abdominal: Soft. Bowel sounds are normal. She exhibits no distension and no mass. There is no tenderness. There is no rebound and no guarding.  Musculoskeletal: She exhibits no edema and no tenderness.  R shoulder and R trap are not tender w/ROM Subacr is not tender MS 5/5 DTRs ok  Lymphadenopathy:    She has no cervical adenopathy.  Neurological: She displays normal reflexes. No cranial nerve deficit. She exhibits normal muscle tone. Coordination normal.  Skin: No rash noted. She is not diaphoretic. No erythema.  Psychiatric: She has a normal mood and affect. Her behavior is normal. Judgment and thought content normal.   LUQ: no mass, NT, no hernia, lipoma, HSM  [place] ok          Assessment & Plan:

## 2013-09-20 NOTE — Assessment & Plan Note (Signed)
Continue with current prescription therapy as reflected on the Med list.  

## 2013-11-02 ENCOUNTER — Other Ambulatory Visit: Payer: Self-pay

## 2014-03-09 LAB — OB RESULTS CONSOLE ABO/RH: RH TYPE: POSITIVE

## 2014-03-09 LAB — OB RESULTS CONSOLE HEPATITIS B SURFACE ANTIGEN: Hepatitis B Surface Ag: NEGATIVE

## 2014-03-09 LAB — OB RESULTS CONSOLE RPR: RPR: NONREACTIVE

## 2014-03-09 LAB — OB RESULTS CONSOLE HIV ANTIBODY (ROUTINE TESTING): HIV: NONREACTIVE

## 2014-03-09 LAB — OB RESULTS CONSOLE ANTIBODY SCREEN: ANTIBODY SCREEN: NEGATIVE

## 2014-03-09 LAB — OB RESULTS CONSOLE RUBELLA ANTIBODY, IGM: Rubella: IMMUNE

## 2014-08-08 ENCOUNTER — Other Ambulatory Visit: Payer: Self-pay | Admitting: Obstetrics & Gynecology

## 2014-08-08 DIAGNOSIS — N9089 Other specified noninflammatory disorders of vulva and perineum: Secondary | ICD-10-CM

## 2014-08-08 DIAGNOSIS — N90810 Female genital mutilation status, unspecified: Secondary | ICD-10-CM

## 2014-08-14 ENCOUNTER — Ambulatory Visit
Admission: RE | Admit: 2014-08-14 | Discharge: 2014-08-14 | Disposition: A | Discharge status: Home / Self Care | Payer: 59 | Source: Ambulatory Visit | Attending: Obstetrics & Gynecology | Admitting: Obstetrics & Gynecology

## 2014-08-14 DIAGNOSIS — N9089 Other specified noninflammatory disorders of vulva and perineum: Secondary | ICD-10-CM

## 2014-08-14 DIAGNOSIS — N90810 Female genital mutilation status, unspecified: Secondary | ICD-10-CM

## 2014-08-16 ENCOUNTER — Other Ambulatory Visit (HOSPITAL_COMMUNITY): Payer: Self-pay | Admitting: Obstetrics & Gynecology

## 2014-08-16 DIAGNOSIS — N90810 Female genital mutilation status, unspecified: Secondary | ICD-10-CM

## 2014-08-16 DIAGNOSIS — N898 Other specified noninflammatory disorders of vagina: Secondary | ICD-10-CM

## 2014-08-17 ENCOUNTER — Ambulatory Visit (HOSPITAL_COMMUNITY): Payer: 59

## 2014-08-21 ENCOUNTER — Ambulatory Visit (HOSPITAL_COMMUNITY)
Admission: RE | Admit: 2014-08-21 | Discharge: 2014-08-21 | Disposition: A | Discharge status: Home / Self Care | Payer: 59 | Source: Ambulatory Visit | Attending: Obstetrics & Gynecology | Admitting: Obstetrics & Gynecology

## 2014-08-21 ENCOUNTER — Other Ambulatory Visit (HOSPITAL_COMMUNITY): Payer: Self-pay | Admitting: Obstetrics & Gynecology

## 2014-08-21 DIAGNOSIS — N90810 Female genital mutilation status, unspecified: Secondary | ICD-10-CM

## 2014-08-21 DIAGNOSIS — R52 Pain, unspecified: Secondary | ICD-10-CM

## 2014-08-21 DIAGNOSIS — N9489 Other specified conditions associated with female genital organs and menstrual cycle: Secondary | ICD-10-CM | POA: Insufficient documentation

## 2014-08-21 DIAGNOSIS — N898 Other specified noninflammatory disorders of vagina: Secondary | ICD-10-CM

## 2014-08-22 ENCOUNTER — Other Ambulatory Visit (HOSPITAL_COMMUNITY): Payer: 59

## 2014-08-23 ENCOUNTER — Ambulatory Visit (HOSPITAL_COMMUNITY): Payer: 59

## 2014-10-01 ENCOUNTER — Other Ambulatory Visit: Payer: Self-pay | Admitting: Obstetrics & Gynecology

## 2014-10-06 DIAGNOSIS — O34219 Maternal care for unspecified type scar from previous cesarean delivery: Secondary | ICD-10-CM | POA: Diagnosis present

## 2014-10-06 DIAGNOSIS — O09529 Supervision of elderly multigravida, unspecified trimester: Secondary | ICD-10-CM

## 2014-10-06 DIAGNOSIS — R1110 Vomiting, unspecified: Secondary | ICD-10-CM | POA: Diagnosis present

## 2014-10-06 DIAGNOSIS — N9089 Other specified noninflammatory disorders of vulva and perineum: Secondary | ICD-10-CM | POA: Diagnosis present

## 2014-10-06 NOTE — H&P (Signed)
Gabriela Le is a 38 y.o. female, G3P1011 at 5739 1/7 weeks, presenting on 10/09/14 for repeat cesarean delivery.  Denies leaking or bleeding, reports +FM.  Patient Active Problem List   Diagnosis Date Noted  . Elderly multigravida with antepartum condition or complication 10/06/2014  . Previous cesarean delivery, antepartum condition or complication 10/06/2014  . Vulvar mass--MRI planned 10/06/2014  . Hyperemesis 10/06/2014  . 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   EDC of 10/15/14 was established by [place] at 10 1/7 weeks.   Traveled to [place] due to mother's illness, so had gap in care with [facility] from visit on 4/7 to 07/30/14.   Anatomy scan:  Done in [place] Additional US evaluations:   35 1/7 weeks:  EFW 5+11, 61%ile, AFI 18.1, 65%ile, vtx. Significant prenatal events:  Mass on labia noted at 29 weeks, initially considered Bartholin's cyst.  Evaluated by US at 33 weeks due to getting larger--? undescended testis vs seroma vs scarring from female circumcision.  MRI planned, with patient desiring it during hospitalization. Elected to plan C/S at 39 weeks.  Had cold sores, Rx'd with Zovirax ointment and Valtrex po.  Still struggling with N/V, given Diclegis per patient request.  Last evaluation:  10/03/14--No GBS done.    OB History   Grav Para Term Preterm Abortions TAB SAB Ect Mult Living   3 1 1  1  0 1   1    2012--SAB 2014--Primary LTCS due to breech presentation, 5 lbs, female, Dr. Pennie RushingHaygood  Past Medical History  Diagnosis Date  . Irregular menses   . 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 H/O cold sores     ACYCLOVIR OINTMENT PRN  . Infertility associated with anovulation     HAS TAKEN CLOMID IN THE PAST  . GERD (gastroesophageal reflux disease)   . Missed ab 12/29/2010    no surgery required  Hx cardiac w/u 2011 for palpitations--all WNL  Past Surgical History  Procedure Laterality  Date  . Appendectomy      6 YOA  . Cesarean section  01/06/2013    Procedure: CESAREAN SECTION;  Surgeon: Hal MoralesVanessa P Haygood, MD;  Location: [facility];  Service: Obstetrics;  Laterality: N/A;  Primary   Family History: family history includes Heart disease in her mother; Hypertension in her mother.  Social History:  reports that she has never smoked. She has never used smokeless tobacco. She reports that she does not drink alcohol or use illicit drugs.  Patient is from [place], of [place] ethnicity, of the Muslim faith, married to the FOB, is a Futures traderhomemaker, high-school educated.     Prenatal Transfer Tool  Maternal Diabetes: No Genetic Screening: Normal [facility] Maternal Ultrasounds/Referrals: Normal Fetal Ultrasounds or other Referrals:  None Maternal Substance Abuse:  No Significant Maternal Medications:  None Significant Maternal Lab Results: None-No evidence of GBS done during pregnancy    ROS:  +FM, occasional contractions.  No Known Allergies   Vital signs stable on admission.  Chest clear Heart RRR without murmur Abd gravid, NT, FH 36 cm Pelvic: Deferred Ext: WNL  FHR: 150 by doppler UCs:  None  Prenatal labs: ABO, Rh: --/--/A POS (10/12 1503)A+ Antibody: NEG (10/12 1503)Neg Rubella:   Rubella RPR: NON REAC (10/12 1503) NR HBsAg: Negative (03/13 0000) Neg HIV: Non-reactive (03/13 0000) NR GBS:  Unknown Sickle cell/Hgb electrophoresis:  AA Pap:  2013 WNL GC:  Negative 03/05/14  Chlamydia:  Negative 03/05/14 Genetic screenings:  Normal [place]ony Glucola:  WNL Other:  Hgb 12.2 at NOB Urine culture + enterococcus 04/03/14, f/u culture negative.   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  Plan[facility] per consult with Dr. Sallye OberKulwa Routine pre-op orders. Will consult regarding f/u on vulvar mass (? MRI during hospitalization).  Druscilla Petsch, VICKICNM, [place] 10/09/2014, 7:46  AM

## 2014-10-08 ENCOUNTER — Encounter (HOSPITAL_COMMUNITY)
Admission: RE | Admit: 2014-10-08 | Discharge: 2014-10-08 | Disposition: A | Discharge status: Home / Self Care | Payer: 59 | Source: Ambulatory Visit | Attending: Obstetrics and Gynecology | Admitting: Obstetrics and Gynecology

## 2014-10-08 ENCOUNTER — Inpatient Hospital Stay (HOSPITAL_COMMUNITY): Admission: RE | Admit: 2014-10-08 | Payer: 59 | Source: Ambulatory Visit

## 2014-10-08 ENCOUNTER — Encounter (HOSPITAL_COMMUNITY): Payer: Self-pay

## 2014-10-08 HISTORY — DX: Missed abortion: O02.1

## 2014-10-08 LAB — CBC
HCT: 35.3 % — ABNORMAL LOW (ref 36.0–46.0)
Hemoglobin: 12 g/dL (ref 12.0–15.0)
MCH: 31.5 pg (ref 26.0–34.0)
MCHC: 34 g/dL (ref 30.0–36.0)
MCV: 92.7 fL (ref 78.0–100.0)
Platelets: 134 10*3/uL — ABNORMAL LOW (ref 150–400)
RBC: 3.81 MIL/uL — ABNORMAL LOW (ref 3.87–5.11)
RDW: 14.6 % (ref 11.5–15.5)
WBC: 9.1 10*3/uL (ref 4.0–10.5)

## 2014-10-08 LAB — TYPE AND SCREEN
ABO/RH(D): A POS
ANTIBODY SCREEN: NEGATIVE

## 2014-10-08 LAB — RPR

## 2014-10-08 NOTE — Patient Instructions (Addendum)
   Your procedure is scheduled on:  Tuesday, Oct 13  [facility] at: 1130 AM Pick up the phone at the desk and dial [phone] and inform us of your arrival.  Please call this number if you have any problems the morning of surgery: [phone]  Remember: Do not eat food after midnight: Tonight- Monday Do not drink clear liquids after: 9 AM Tuesday, day of surgery Take these medicines the morning of surgery with a SIP OF WATER:  ZANTAC   Do not wear jewelry, make-up, or FINGER nail polish No metal in your hair or on your body. Do not wear lotions, powders, perfumes.  You may wear deodorant.  Do not bring valuables to the hospital. Contacts, dentures or bridgework may not be worn into surgery.  Leave suitcase in the car. After Surgery it may be brought to your room. For patients being admitted to the hospital, checkout time is 11:00am the day of discharge.  Home with husband Lazarus GowdaYaser cell [phone]

## 2014-10-08 NOTE — Pre-Procedure Instructions (Signed)
Informed Dr Brayton CavesFreeman Jackson of patient's low platelets 134 at today's PAT appt.  No orders given, ok for surgery.

## 2014-10-08 NOTE — Pre-Procedure Instructions (Signed)
Used Print production plannerArabic Translator - Ms. Daleen BoFeryal Yousef for today's PAT appointment.

## 2014-10-09 ENCOUNTER — Encounter (HOSPITAL_COMMUNITY): Payer: Self-pay

## 2014-10-09 ENCOUNTER — Encounter (HOSPITAL_COMMUNITY): Payer: 59 | Admitting: Anesthesiology

## 2014-10-09 ENCOUNTER — Encounter (HOSPITAL_COMMUNITY)
Admission: AD | Disposition: A | Discharge status: Home / Self Care | Payer: Self-pay | Source: Ambulatory Visit | Attending: Obstetrics & Gynecology

## 2014-10-09 ENCOUNTER — Inpatient Hospital Stay (HOSPITAL_COMMUNITY): Payer: 59 | Admitting: Anesthesiology

## 2014-10-09 ENCOUNTER — Inpatient Hospital Stay (HOSPITAL_COMMUNITY)
Admission: AD | Admit: 2014-10-09 | Discharge: 2014-10-12 | DRG: 766 | Disposition: A | Discharge status: Home / Self Care | Payer: 59 | Source: Ambulatory Visit | Attending: Obstetrics & Gynecology | Admitting: Obstetrics & Gynecology

## 2014-10-09 DIAGNOSIS — R42 Dizziness and giddiness: Secondary | ICD-10-CM | POA: Diagnosis not present

## 2014-10-09 DIAGNOSIS — Z98891 History of uterine scar from previous surgery: Secondary | ICD-10-CM

## 2014-10-09 DIAGNOSIS — Z8249 Family history of ischemic heart disease and other diseases of the circulatory system: Secondary | ICD-10-CM

## 2014-10-09 DIAGNOSIS — O3421 Maternal care for scar from previous cesarean delivery: Secondary | ICD-10-CM | POA: Diagnosis present

## 2014-10-09 DIAGNOSIS — Z3A39 39 weeks gestation of pregnancy: Secondary | ICD-10-CM | POA: Diagnosis present

## 2014-10-09 DIAGNOSIS — O09529 Supervision of elderly multigravida, unspecified trimester: Secondary | ICD-10-CM

## 2014-10-09 DIAGNOSIS — K219 Gastro-esophageal reflux disease without esophagitis: Secondary | ICD-10-CM | POA: Diagnosis present

## 2014-10-09 DIAGNOSIS — O09523 Supervision of elderly multigravida, third trimester: Secondary | ICD-10-CM

## 2014-10-09 DIAGNOSIS — O34219 Maternal care for unspecified type scar from previous cesarean delivery: Secondary | ICD-10-CM | POA: Diagnosis present

## 2014-10-09 DIAGNOSIS — N90810 Female genital mutilation status, unspecified: Secondary | ICD-10-CM | POA: Diagnosis present

## 2014-10-09 DIAGNOSIS — O3483 Maternal care for other abnormalities of pelvic organs, third trimester: Secondary | ICD-10-CM | POA: Diagnosis present

## 2014-10-09 DIAGNOSIS — O99613 Diseases of the digestive system complicating pregnancy, third trimester: Secondary | ICD-10-CM | POA: Diagnosis present

## 2014-10-09 DIAGNOSIS — R1110 Vomiting, unspecified: Secondary | ICD-10-CM | POA: Diagnosis present

## 2014-10-09 DIAGNOSIS — N9089 Other specified noninflammatory disorders of vulva and perineum: Secondary | ICD-10-CM | POA: Diagnosis present

## 2014-10-09 SURGERY — Surgical Case
Anesthesia: Spinal | Site: Abdomen

## 2014-10-09 MED ORDER — OXYTOCIN 10 UNIT/ML IJ SOLN
40.0000 [IU] | INTRAVENOUS | Status: DC | PRN
  Administered 2014-10-09: 40 [IU] via INTRAVENOUS

## 2014-10-09 MED ORDER — MENTHOL 3 MG MT LOZG: 1.0000 | LOZENGE | OROMUCOSAL | Status: DC | PRN

## 2014-10-09 MED ORDER — MEPERIDINE HCL 25 MG/ML IJ SOLN: 6.2500 mg | INTRAMUSCULAR | Status: DC | PRN

## 2014-10-09 MED ORDER — NALBUPHINE HCL 10 MG/ML IJ SOLN: 5.0000 mg | Freq: Once | INTRAMUSCULAR | Status: AC | PRN

## 2014-10-09 MED ORDER — FENTANYL CITRATE 0.05 MG/ML IJ SOLN
INTRAMUSCULAR | Status: AC
  Filled 2014-10-09: qty 2

## 2014-10-09 MED ORDER — OXYCODONE-ACETAMINOPHEN 5-325 MG PO TABS
1.0000 | ORAL_TABLET | ORAL | Status: DC | PRN
  Administered 2014-10-10 – 2014-10-12 (×4): 1 via ORAL
  Filled 2014-10-09 (×4): qty 1

## 2014-10-09 MED ORDER — DIPHENHYDRAMINE HCL 25 MG PO CAPS: 25.0000 mg | ORAL_CAPSULE | Freq: Four times a day (QID) | ORAL | Status: DC | PRN

## 2014-10-09 MED ORDER — LANOLIN HYDROUS EX OINT: 1.0000 "application " | TOPICAL_OINTMENT | CUTANEOUS | Status: DC | PRN

## 2014-10-09 MED ORDER — ONDANSETRON HCL 4 MG/2ML IJ SOLN
INTRAMUSCULAR | Status: DC | PRN
  Administered 2014-10-09: 4 mg via INTRAVENOUS

## 2014-10-09 MED ORDER — ONDANSETRON HCL 4 MG PO TABS
4.0000 mg | ORAL_TABLET | ORAL | Status: DC | PRN
  Administered 2014-10-11: 4 mg via ORAL
  Filled 2014-10-09: qty 1

## 2014-10-09 MED ORDER — SCOPOLAMINE 1 MG/3DAYS TD PT72
MEDICATED_PATCH | TRANSDERMAL | Status: AC
  Administered 2014-10-09: 1.5 mg via TRANSDERMAL
  Filled 2014-10-09: qty 1

## 2014-10-09 MED ORDER — CEFAZOLIN SODIUM-DEXTROSE 2-3 GM-% IV SOLR: 2.0000 g | INTRAVENOUS | Status: DC

## 2014-10-09 MED ORDER — ONDANSETRON HCL 4 MG/2ML IJ SOLN: 4.0000 mg | Freq: Three times a day (TID) | INTRAMUSCULAR | Status: DC | PRN

## 2014-10-09 MED ORDER — NALOXONE HCL 0.4 MG/ML IJ SOLN
0.4000 mg | INTRAMUSCULAR | Status: DC | PRN
Start: 2014-10-09 — End: 2014-10-12

## 2014-10-09 MED ORDER — DIPHENHYDRAMINE HCL 50 MG/ML IJ SOLN: 12.5000 mg | INTRAMUSCULAR | Status: DC | PRN

## 2014-10-09 MED ORDER — FENTANYL CITRATE 0.05 MG/ML IJ SOLN
INTRAMUSCULAR | Status: AC
Start: 2014-10-09 — End: 2014-10-10
  Filled 2014-10-09: qty 2

## 2014-10-09 MED ORDER — WITCH HAZEL-GLYCERIN EX PADS: 1.0000 "application " | MEDICATED_PAD | CUTANEOUS | Status: DC | PRN

## 2014-10-09 MED ORDER — NALBUPHINE HCL 10 MG/ML IJ SOLN
INTRAMUSCULAR | Status: AC
  Administered 2014-10-09: 5 mg via SUBCUTANEOUS
  Filled 2014-10-09: qty 1

## 2014-10-09 MED ORDER — PHENYLEPHRINE 8 MG IN D5W 100 ML (0.08MG/ML) PREMIX OPTIME
INJECTION | INTRAVENOUS | Status: AC
  Filled 2014-10-09: qty 100

## 2014-10-09 MED ORDER — SCOPOLAMINE 1 MG/3DAYS TD PT72
1.0000 | MEDICATED_PATCH | Freq: Once | TRANSDERMAL | Status: AC
  Administered 2014-10-09: 1.5 mg via TRANSDERMAL

## 2014-10-09 MED ORDER — HYDROMORPHONE HCL 1 MG/ML IJ SOLN
INTRAMUSCULAR | Status: AC
  Administered 2014-10-09: 0.5 mg via INTRAVENOUS
  Filled 2014-10-09: qty 1

## 2014-10-09 MED ORDER — PRENATAL MULTIVITAMIN CH
1.0000 | ORAL_TABLET | Freq: Every day | ORAL | Status: DC
  Administered 2014-10-10 – 2014-10-11 (×2): 1 via ORAL
  Filled 2014-10-09 (×2): qty 1

## 2014-10-09 MED ORDER — PHENYLEPHRINE 8 MG IN D5W 100 ML (0.08MG/ML) PREMIX OPTIME
INJECTION | INTRAVENOUS | Status: DC | PRN
  Administered 2014-10-09: 80 ug/min via INTRAVENOUS

## 2014-10-09 MED ORDER — HYDROMORPHONE HCL 1 MG/ML IJ SOLN
0.2500 mg | INTRAMUSCULAR | Status: DC | PRN
  Administered 2014-10-09 (×4): 0.5 mg via INTRAVENOUS

## 2014-10-09 MED ORDER — FENTANYL CITRATE 0.05 MG/ML IJ SOLN
100.0000 ug | Freq: Once | INTRAMUSCULAR | Status: AC
  Administered 2014-10-09: 100 ug via INTRAVENOUS

## 2014-10-09 MED ORDER — NALOXONE HCL 1 MG/ML IJ SOLN: 1.0000 ug/kg/h | INTRAMUSCULAR | Status: DC | PRN

## 2014-10-09 MED ORDER — CEFAZOLIN SODIUM-DEXTROSE 2-3 GM-% IV SOLR
INTRAVENOUS | Status: AC
  Administered 2014-10-09: 2 g via INTRAVENOUS
  Filled 2014-10-09: qty 50

## 2014-10-09 MED ORDER — IBUPROFEN 600 MG PO TABS: 600.0000 mg | ORAL_TABLET | Freq: Four times a day (QID) | ORAL | Status: DC | PRN

## 2014-10-09 MED ORDER — PROMETHAZINE HCL 25 MG/ML IJ SOLN: 6.2500 mg | INTRAMUSCULAR | Status: DC | PRN

## 2014-10-09 MED ORDER — KETOROLAC TROMETHAMINE 30 MG/ML IJ SOLN: 30.0000 mg | Freq: Four times a day (QID) | INTRAMUSCULAR | Status: AC | PRN

## 2014-10-09 MED ORDER — NALBUPHINE HCL 10 MG/ML IJ SOLN: 5.0000 mg | INTRAMUSCULAR | Status: DC | PRN

## 2014-10-09 MED ORDER — SIMETHICONE 80 MG PO CHEW
80.0000 mg | CHEWABLE_TABLET | ORAL | Status: DC
  Administered 2014-10-10 – 2014-10-11 (×3): 80 mg via ORAL
  Filled 2014-10-09 (×3): qty 1

## 2014-10-09 MED ORDER — TRIAMCINOLONE ACETONIDE 40 MG/ML IJ SUSP
INTRAMUSCULAR | Status: DC | PRN
  Administered 2014-10-09: 40 mg via INTRADERMAL

## 2014-10-09 MED ORDER — 0.9 % SODIUM CHLORIDE (POUR BTL) OPTIME
TOPICAL | Status: DC | PRN
  Administered 2014-10-09: 1000 mL

## 2014-10-09 MED ORDER — INFLUENZA VAC SPLIT QUAD 0.5 ML IM SUSY
0.5000 mL | PREFILLED_SYRINGE | INTRAMUSCULAR | Status: AC | PRN
  Administered 2014-10-12: 0.5 mL via INTRAMUSCULAR
  Filled 2014-10-09: qty 0.5

## 2014-10-09 MED ORDER — MORPHINE SULFATE (PF) 0.5 MG/ML IJ SOLN
INTRAMUSCULAR | Status: DC | PRN
  Administered 2014-10-09: .2 mg via INTRATHECAL

## 2014-10-09 MED ORDER — OXYCODONE-ACETAMINOPHEN 5-325 MG PO TABS
2.0000 | ORAL_TABLET | ORAL | Status: DC | PRN
  Administered 2014-10-11: 2 via ORAL
  Filled 2014-10-09: qty 2

## 2014-10-09 MED ORDER — SODIUM CHLORIDE 0.9 % IJ SOLN: 3.0000 mL | INTRAMUSCULAR | Status: DC | PRN

## 2014-10-09 MED ORDER — CELECOXIB 200 MG PO CAPS
400.0000 mg | ORAL_CAPSULE | Freq: Once | ORAL | Status: AC
  Administered 2014-10-09: 400 mg via ORAL
  Filled 2014-10-09: qty 2

## 2014-10-09 MED ORDER — ONDANSETRON HCL 4 MG/2ML IJ SOLN: 4.0000 mg | INTRAMUSCULAR | Status: DC | PRN

## 2014-10-09 MED ORDER — LACTATED RINGERS IV SOLN
INTRAVENOUS | Status: DC
  Administered 2014-10-09: 13:00:00 via INTRAVENOUS
  Administered 2014-10-09: 125 mL/h via INTRAVENOUS
  Administered 2014-10-09 (×2): via INTRAVENOUS

## 2014-10-09 MED ORDER — FENTANYL CITRATE 0.05 MG/ML IJ SOLN
INTRAMUSCULAR | Status: DC | PRN
  Administered 2014-10-09: 50 ug via INTRAVENOUS
  Administered 2014-10-09: 40 ug via INTRAVENOUS
  Administered 2014-10-09: 10 ug via INTRATHECAL

## 2014-10-09 MED ORDER — DIBUCAINE 1 % RE OINT: 1.0000 "application " | TOPICAL_OINTMENT | RECTAL | Status: DC | PRN

## 2014-10-09 MED ORDER — SENNOSIDES-DOCUSATE SODIUM 8.6-50 MG PO TABS
2.0000 | ORAL_TABLET | ORAL | Status: DC
  Administered 2014-10-10 (×2): 2 via ORAL
  Filled 2014-10-09 (×3): qty 2

## 2014-10-09 MED ORDER — SIMETHICONE 80 MG PO CHEW
80.0000 mg | CHEWABLE_TABLET | Freq: Three times a day (TID) | ORAL | Status: DC
  Administered 2014-10-10 – 2014-10-12 (×6): 80 mg via ORAL
  Filled 2014-10-09 (×6): qty 1

## 2014-10-09 MED ORDER — DIPHENHYDRAMINE HCL 25 MG PO CAPS
25.0000 mg | ORAL_CAPSULE | ORAL | Status: DC | PRN
  Administered 2014-10-09: 25 mg via ORAL
  Filled 2014-10-09: qty 1

## 2014-10-09 MED ORDER — ONDANSETRON HCL 4 MG/2ML IJ SOLN
INTRAMUSCULAR | Status: AC
  Filled 2014-10-09: qty 2

## 2014-10-09 MED ORDER — TRIAMCINOLONE ACETONIDE 40 MG/ML IJ SUSP
Freq: Once | INTRAMUSCULAR | Status: DC
  Filled 2014-10-09: qty 10

## 2014-10-09 MED ORDER — MORPHINE SULFATE 0.5 MG/ML IJ SOLN
INTRAMUSCULAR | Status: AC
  Filled 2014-10-09: qty 10

## 2014-10-09 MED ORDER — NALBUPHINE HCL 10 MG/ML IJ SOLN
5.0000 mg | INTRAMUSCULAR | Status: DC | PRN
Start: 2014-10-09 — End: 2014-10-12
  Administered 2014-10-09: 5 mg via SUBCUTANEOUS

## 2014-10-09 MED ORDER — IBUPROFEN 600 MG PO TABS
600.0000 mg | ORAL_TABLET | Freq: Four times a day (QID) | ORAL | Status: DC
  Administered 2014-10-10 – 2014-10-12 (×11): 600 mg via ORAL
  Filled 2014-10-09 (×11): qty 1

## 2014-10-09 MED ORDER — CHLOROPROCAINE HCL 3 % IJ SOLN
INTRAMUSCULAR | Status: DC | PRN
  Administered 2014-10-09: 20 mL

## 2014-10-09 MED ORDER — KETOROLAC TROMETHAMINE 30 MG/ML IJ SOLN
30.0000 mg | Freq: Four times a day (QID) | INTRAMUSCULAR | Status: AC | PRN
  Administered 2014-10-09: 30 mg via INTRAMUSCULAR

## 2014-10-09 MED ORDER — SIMETHICONE 80 MG PO CHEW: 80.0000 mg | CHEWABLE_TABLET | ORAL | Status: DC | PRN

## 2014-10-09 MED ORDER — CHLOROPROCAINE HCL 3 % IJ SOLN
INTRAMUSCULAR | Status: AC
  Filled 2014-10-09: qty 20

## 2014-10-09 MED ORDER — BUPIVACAINE IN DEXTROSE 0.75-8.25 % IT SOLN
INTRATHECAL | Status: DC | PRN
  Administered 2014-10-09: 1.2 mL via INTRATHECAL

## 2014-10-09 MED ORDER — ZOLPIDEM TARTRATE 5 MG PO TABS
5.0000 mg | ORAL_TABLET | Freq: Every evening | ORAL | Status: DC | PRN
Start: 2014-10-09 — End: 2014-10-12

## 2014-10-09 MED ORDER — LACTATED RINGERS IV SOLN
INTRAVENOUS | Status: DC
  Administered 2014-10-09: 22:00:00 via INTRAVENOUS

## 2014-10-09 MED ORDER — KETOROLAC TROMETHAMINE 30 MG/ML IJ SOLN
INTRAMUSCULAR | Status: AC
  Administered 2014-10-09: 30 mg via INTRAMUSCULAR
  Filled 2014-10-09: qty 1

## 2014-10-09 MED ORDER — GABAPENTIN 400 MG PO CAPS
800.0000 mg | ORAL_CAPSULE | Freq: Once | ORAL | Status: AC
  Administered 2014-10-09: 800 mg via ORAL
  Filled 2014-10-09: qty 2

## 2014-10-09 MED ORDER — KETOROLAC TROMETHAMINE 30 MG/ML IJ SOLN: 15.0000 mg | Freq: Once | INTRAMUSCULAR | Status: DC | PRN

## 2014-10-09 MED ORDER — OXYTOCIN 10 UNIT/ML IJ SOLN
INTRAMUSCULAR | Status: AC
  Filled 2014-10-09: qty 4

## 2014-10-09 MED ORDER — OXYTOCIN 40 UNITS IN LACTATED RINGERS INFUSION - SIMPLE MED: 62.5000 mL/h | INTRAVENOUS | Status: AC

## 2014-10-09 SURGICAL SUPPLY — 41 items
CLAMP CORD UMBIL (MISCELLANEOUS) IMPLANT
CLOTH BEACON ORANGE TIMEOUT ST (SAFETY) ×3 IMPLANT
CONTAINER PREFILL 10% NBF 15ML (MISCELLANEOUS) IMPLANT
COVER LIGHT HANDLE  1/PK (MISCELLANEOUS) ×4
COVER LIGHT HANDLE 1/PK (MISCELLANEOUS) ×2 IMPLANT
DERMABOND ADVANCED (GAUZE/BANDAGES/DRESSINGS)
DERMABOND ADVANCED .7 DNX12 (GAUZE/BANDAGES/DRESSINGS) IMPLANT
DRAPE SHEET LG 3/4 BI-LAMINATE (DRAPES) IMPLANT
DRSG OPSITE POSTOP 4X10 (GAUZE/BANDAGES/DRESSINGS) ×3 IMPLANT
DRSG TELFA 3X8 NADH (GAUZE/BANDAGES/DRESSINGS) ×3 IMPLANT
DURAPREP 26ML APPLICATOR (WOUND CARE) ×3 IMPLANT
ELECT REM PT RETURN 9FT ADLT (ELECTROSURGICAL) ×3
ELECTRODE REM PT RTRN 9FT ADLT (ELECTROSURGICAL) ×1 IMPLANT
EXTRACTOR VACUUM M CUP 4 TUBE (SUCTIONS) IMPLANT
EXTRACTOR VACUUM M CUP 4' TUBE (SUCTIONS)
GLOVE BIOGEL PI IND STRL 7.0 (GLOVE) ×1 IMPLANT
GLOVE BIOGEL PI INDICATOR 7.0 (GLOVE) ×2
GLOVE SURG SS PI 6.5 STRL IVOR (GLOVE) ×3 IMPLANT
GOWN STRL REUS W/TWL LRG LVL3 (GOWN DISPOSABLE) ×6 IMPLANT
KIT ABG SYR 3ML LUER SLIP (SYRINGE) IMPLANT
NEEDLE HYPO 25X5/8 SAFETYGLIDE (NEEDLE) ×3 IMPLANT
NS IRRIG 1000ML POUR BTL (IV SOLUTION) ×3 IMPLANT
PACK C SECTION WH (CUSTOM PROCEDURE TRAY) ×3 IMPLANT
PAD ABD 7.5X8 STRL (GAUZE/BANDAGES/DRESSINGS) ×3 IMPLANT
PAD OB MATERNITY 4.3X12.25 (PERSONAL CARE ITEMS) ×3 IMPLANT
RTRCTR C-SECT PINK 25CM LRG (MISCELLANEOUS) ×3 IMPLANT
SPONGE GAUZE 4X4 12PLY STER LF (GAUZE/BANDAGES/DRESSINGS) ×3 IMPLANT
SUT CHROMIC 2 0 CT 1 (SUTURE) ×3 IMPLANT
SUT MON AB 4-0 PS1 27 (SUTURE) ×3 IMPLANT
SUT PLAIN 1 NONE 54 (SUTURE) IMPLANT
SUT PLAIN 2 0 (SUTURE) ×2
SUT PLAIN 2 0 XLH (SUTURE) IMPLANT
SUT PLAIN ABS 2-0 CT1 27XMFL (SUTURE) ×1 IMPLANT
SUT VIC AB 0 CTX 36 (SUTURE) ×2
SUT VIC AB 0 CTX36XBRD ANBCTRL (SUTURE) ×1 IMPLANT
SUT VIC AB 1 CTX 36 (SUTURE) ×4
SUT VIC AB 1 CTX36XBRD ANBCTRL (SUTURE) ×2 IMPLANT
TAPE CLOTH SURG 4X10 WHT LF (GAUZE/BANDAGES/DRESSINGS) ×3 IMPLANT
TOWEL OR 17X24 6PK STRL BLUE (TOWEL DISPOSABLE) ×3 IMPLANT
TRAY FOLEY CATH 14FR (SET/KITS/TRAYS/PACK) ×3 IMPLANT
WATER STERILE IRR 1000ML POUR (IV SOLUTION) ×3 IMPLANT

## 2014-10-09 NOTE — Op Note (Addendum)
Date of surgery: 10/09/2014   Preop diagnosis:  1. 39 w 1 day EGA intrauterine pregnancy 2.  1 prior cesarean section desires repeat cesarean section.    Postop diagnosis:   Same as above.    Procedure: Repeat low uterine segment transverse cesarean section via Pfannenstiel incision.     Surgeon: Dr.  Hoover BrownsEma Sherlene Rickel  Asst.: Horald ChestnutVicky Latham, CNM  Anesthesia: Spinal  Complications: None  Findings: Viable female infant in cephalic presentation, DOP, Apgar scores of 8 and 9. Normal uterus, fallopian tubes and ovaries bilaterally.     EBL: 600 cc  IV fluid: 2700 cc LR per anesthesiologist  Urine output: 400 cc clear urine  FINDINGS: Viable female infant in DOP position.  Normal ovaries and tubes bilaterally.  INDICATION: 38 y/o G3P1011 @ 39W 1 D EGA with 1 prior cesarean section who desired a repeat cesarean section delivery.    Procedure:   Informed consent was obtained from the patient to undergo the procedure. She was taken to the operating room where her spinal anesthesia was found to be adequate. She was prepped and draped in the usual sterile fashion and a Foley catheter was placed. She received 2 g of IV Ancef preoperatively.  Triamcinolone-bupivacaine injection was instilled over the old pfannenstiel incision.  Scar tissue from the prior pfannenstiel incision was removed with a scalpel.  The incision was extended through the subcutaneous layer and also the fascia with the bovie. Small perforators in the subcutaneous layer were contained with the Bovie. The fascia was nicked in the midline and then was further separated from the rectus muscles bilaterally using Mayo scissors. Kochers were placed inferiorly and then superiorly to allow further separation of fascia from the rectus muscles.  The peritoneal cavity was entered bluntly with the fingers. The [name] retractor was placed in. The bladder flap was created using Metzenbaum scissors.   The uterus was incised with a scalpel and the  incision extended bilaterally bluntly with fingers being careful not to reach the large uterine vessels on the side . Clear amniotic fluid was noted.  The head then the rest of the body was then delivered with abdominal pressure.  She delivered a viable female infant, apgar scores 8, 9.  The cord was clamped and cut. Cord blood was collected.    The placenta was delivered with gentle traction on the umbilical cord. The uterus was exteriorized.  The edges of the uterus was grasped with Allis clamps and also T. Clamps. The uterus was cleared of clots and debris with a lap.  The incision uterine incision was closed with #1 Vicryl in a running locked stitch. An imbricating layer of came stitch was placed over the initial closure.  A small area that bled on the left side was contained with figure of 8 stitch. Irrigation was applied and suctioned out. Excellent hemostasis was noted over the incision.  The adnexa were visualized and appeared normal bilaterally.  The muscles were then reapproximated using chromic suture interrupted stitches.  Fascia was closed using 0 Vicryl in a running stitch. The subcutaneous layer was irrigated and suctioned out. Small perforators were contained with the bovie.  The subcutaneous was closed over using 2-0 plain. As patient was uncomfortable towards end of the procedure chloroprocaine per anesthesia was poured over incision as local anesthesia.  The skin was closed using 4-0 Monocryl.  Honeycomb then pressure dressing was then applied. The patient was then cleaned and she was taken to the recovery room in stable condition. The  neonate was also taken to the recovery area with the mother in stable condition.   Specimens: Umbilical cord blood

## 2014-10-09 NOTE — Interval H&P Note (Signed)
History and Physical Interval Note:  10/09/2014 12:54 PM  Gabriela Le  has presented today for surgery, with the diagnosis of Prior Cesarean Section  The various methods of treatment have been discussed with the patient and family. After consideration of risks, benefits and other options for treatment, the patient has consented to  Procedure(s): CESAREAN SECTION (N/A) as a surgical intervention .  The patient's history has been reviewed, patient examined, no change in status, stable for surgery.  I have reviewed the patient's chart and labs.  Questions were answered to the patient's satisfaction.     [facility] Center[facility],[name]

## 2014-10-09 NOTE — Brief Op Note (Signed)
10/09/2014  2:42 PM  PATIENT:  Gabriela Le  38 y.o. female  PRE-OPERATIVE DIAGNOSIS:  Prior Cesarean Section  POST-OPERATIVE DIAGNOSIS:  Prior Cesarean Section  PROCEDURE:  Repeat Lower Uterine Segment Transverse cesarean section via Pfannenstiel Incision.    SURGEON:  Surgeon(s) and Role:    * Greydon Betke Alger SimonsWakuru Erich Kochan, MD - Primary  ASSISTANTS: Horald ChestnutVicky Latham, CNM.   ANESTHESIA:   spinal  EBL:  Total I/O In: 2700 [I.V.:2700] Out: 1050 [Urine:450; Blood:600]  BLOOD ADMINISTERED:none  DRAINS: none   LOCAL MEDICATIONS USED:  OTHER Chloroprocaine. Bupivacaine (in triamcinolone).   SPECIMEN:  Source of Specimen:  Cord blood.   DISPOSITION OF SPECIMEN:  N/A  COUNTS:  YES  TOURNIQUET:  * No tourniquets in log *  DICTATION: .Note written in EPIC  PLAN OF CARE: Admit to inpatient   PATIENT DISPOSITION:  PACU - hemodynamically stable.   Delay start of Pharmacological VTE agent (>24hrs) due to surgical blood loss or risk of bleeding: not applicable

## 2014-10-09 NOTE — Anesthesia Procedure Notes (Signed)
Spinal  Patient location during procedure: OR Start time: 10/09/2014 1:10 PM End time: 10/09/2014 1:13 PM Staffing Anesthesiologist: Leilani AbleHATCHETT, Karianna Gusman Performed by: anesthesiologist  Preanesthetic Checklist Completed: patient identified, surgical consent, pre-op evaluation, timeout performed, IV checked, risks and benefits discussed and monitors and equipment checked Spinal Block Patient position: sitting Prep: site prepped and draped and DuraPrep Patient monitoring: heart rate, cardiac monitor, continuous pulse ox and blood pressure Approach: midline Location: L3-4 Injection technique: single-shot Needle Needle type: Pencan  Needle gauge: 24 G Needle length: 9 cm Needle insertion depth: 4 cm Assessment Sensory level: T4

## 2014-10-09 NOTE — Anesthesia Postprocedure Evaluation (Signed)
Anesthesia Post Note  Patient: Gabriela Le  Procedure(s) Performed: Procedure(s) (LRB): CESAREAN SECTION (N/A)  Anesthesia type: Spinal  Patient location: PACU  Post pain: Pain level controlled  Post assessment: Post-op Vital signs reviewed  Last Vitals:  Filed Vitals:   10/09/14 1500  BP: 103/70  Pulse: 73  Temp:   Resp: 25    Post vital signs: Reviewed  Level of consciousness: awake  Complications: No apparent anesthesia complications

## 2014-10-09 NOTE — Anesthesia Preprocedure Evaluation (Signed)
Anesthesia Evaluation  Patient identified by MRN, date of birth, ID band Patient awake    Reviewed: Allergy & Precautions, H&P , NPO status , Patient's Chart, lab work & pertinent test results  Airway Mallampati: I TM Distance: >3 FB Neck ROM: full    Dental no notable dental hx.    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      H/O cold sores   ACYCLOVIR OINTMENT PRN Infertility associated with anovulation   HAS TAKEN CLOMID IN THE PAST    GERD (gastroesophageal reflux disease)    Reproductive/Obstetrics (+) Pregnancy                           Anesthesia Physical  Anesthesia Plan  ASA: II  Anesthesia Plan: Spinal   Post-op Pain Management:    Induction:   Airway Management Planned:   Additional Equipment:   Intra-op Plan:   Post-operative Plan:   Informed Consent: I have reviewed the patients History and Physical, chart, labs and discussed the procedure including the risks, benefits and alternatives for the proposed anesthesia with the patient or authorized representative who has indicated his/her understanding and acceptance.     Plan Discussed with: CRNA and Surgeon  Anesthesia Plan Comments:        Anesthesia Quick Evaluation

## 2014-10-09 NOTE — Transfer of Care (Signed)
Immediate Anesthesia Transfer of Care Note  Patient: Gabriela GivensMona O Lurie  Procedure(s) Performed: Procedure(s): CESAREAN SECTION (N/A)  Patient Location: PACU  Anesthesia Type:Spinal  Level of Consciousness: awake, alert  and oriented  Airway & Oxygen Therapy: Patient Spontanous Breathing  Post-op Assessment: Report given to PACU RN and Post -op Vital signs reviewed and stable  Post vital signs: Reviewed and stable  Complications: No apparent anesthesia complications

## 2014-10-10 ENCOUNTER — Inpatient Hospital Stay (HOSPITAL_COMMUNITY): Admission: AD | Admit: 2014-10-10 | Payer: 59 | Source: Ambulatory Visit | Admitting: Obstetrics & Gynecology

## 2014-10-10 ENCOUNTER — Encounter (HOSPITAL_COMMUNITY): Payer: Self-pay | Admitting: Obstetrics & Gynecology

## 2014-10-10 DIAGNOSIS — Z98891 History of uterine scar from previous surgery: Secondary | ICD-10-CM

## 2014-10-10 LAB — CBC
HEMATOCRIT: 29.5 % — AB (ref 36.0–46.0)
HEMOGLOBIN: 10.1 g/dL — AB (ref 12.0–15.0)
MCH: 31.5 pg (ref 26.0–34.0)
MCHC: 34.2 g/dL (ref 30.0–36.0)
MCV: 91.9 fL (ref 78.0–100.0)
Platelets: 130 10*3/uL — ABNORMAL LOW (ref 150–400)
RBC: 3.21 MIL/uL — ABNORMAL LOW (ref 3.87–5.11)
RDW: 14.2 % (ref 11.5–15.5)
WBC: 11.1 10*3/uL — AB (ref 4.0–10.5)

## 2014-10-10 MED ORDER — TETANUS-DIPHTH-ACELL PERTUSSIS 5-2.5-18.5 LF-MCG/0.5 IM SUSP
0.5000 mL | Freq: Once | INTRAMUSCULAR | Status: AC
  Administered 2014-10-10: 0.5 mL via INTRAMUSCULAR
  Filled 2014-10-10: qty 0.5

## 2014-10-10 NOTE — Progress Notes (Signed)
Made appointment, for [facility] to pick [facility], [facility].  Appointment. Spoke with The Mosaic CompanyDoug.

## 2014-10-10 NOTE — Anesthesia Postprocedure Evaluation (Signed)
  Anesthesia Post-op Note  Patient: Gabriela Le  Procedure(s) Performed: Procedure(s): CESAREAN SECTION (N/A)  Patient Location: PACU and Mother/Baby  Anesthesia Type:Spinal  Level of Consciousness: awake, alert  and oriented  Airway and Oxygen Therapy:   Post-op Pain: moderate Spoke to RN who said she was going to give percocet  Post-op Assessment: Patient's Cardiovascular Status Stable, Respiratory Function Stable, No signs of Nausea or vomiting, Adequate PO intake, No headache, No backache, No residual numbness and No residual motor weakness  Post-op Vital Signs: Reviewed and stable  Last Vitals:  Filed Vitals:   10/10/14 0900  BP: 105/65  Pulse: 81  Temp: 37.2 C  Resp: 18    Complications: No apparent anesthesia complications

## 2014-10-10 NOTE — Progress Notes (Signed)
Expressed to patient that is it time for catheter to come out and patient stated "no, please i want to sleep.  Take it out at 10am."  Patient agreed to walk to toilet for peri care and asked once again if i could take foley out, patient again stated no.

## 2014-10-10 NOTE — Progress Notes (Signed)
Subjective: Postpartum Day 1: Cesarean Delivery due to prior C/S, desired repeat Patient had declined d/c of foley cath during the night to allow for rest.  She has ambulated to BR for peri-care without difficulty. Feeding:  Breast Contraceptive plan:  Undecided  Objective: Vital signs in last 24 hours: Temp:  [97.1 F (36.2 C)-98.6 F (37 C)] 98.1 F (36.7 C) (10/14 0521) Pulse Rate:  [64-91] 64 (10/14 0521) Resp:  [16-28] 16 (10/14 0521) BP: (90-127)/(44-77) 90/50 mmHg (10/14 0521) SpO2:  [87 %-100 %] 97 % (10/14 0521) Weight:  [127 lb (57.607 kg)] 127 lb (57.607 kg) (10/13 1638)  Physical Exam:  General: alert Lochia: appropriate Uterine Fundus: firm Abdomen:  + bowel sounds Incision:  Pressure dressing with old drainage on lower border. DVT Evaluation: No evidence of DVT seen on physical exam. Negative Homan's sign.  Area of left vulvar mass appears smaller today, but still present.  NT.   Recent Labs  10/08/14 1503 10/10/14 0550  HGB 12.0 10.1*  HCT 35.3* 29.5*  WBC 9.1 11.1*    Assessment/Plan: Status post Cesarean section day 1. Doing well postoperatively.  Advised patient foley to be removed this am, encouraged ambulation. Per order of Dr. Sallye OberKulwa, patient scheduled for pelvic MRI with contrast today for evaluation of vulvar mass. [facility], [address] [place] [facility]   Patient's RN will arrange for CareLink transfer for study. Per Radiology staff, contrast will be IV only, with Gadolinium to be used. Per Vernona RiegerLaura, Lactation Consultant, Gadolinium is compatible with breastfeeding. Results to Dr. Su Hiltoberts Continue current care. Dr. Sallye OberKulwa to do inpatient circumcision tomorrow per patient request.  Nigel BridgemanLATHAM, Madylyn Insco CNM, [place] 10/10/2014, 8:50 AM

## 2014-10-10 NOTE — Progress Notes (Signed)
[facility]---scan not done because they thought she had staples.  Patient was also re-scheduled several times from the 2pm initial appointment.  No call from MRI to ask if she had staples, and no review of the op note occurred in the MRI department.  The op note is very clear regarding the skin closure with suture.  I called the MRI department at [phone] spoke with Art, MRI tech.  He advised the radiologist canceled the test "due to the patient having staples"--which she does not.  He asked if I wanted the [facility] for [facility] scan.  I advised him I did not.  We will r/s the test for tomorrow.  Dr. Su Hiltoberts informed of issue.  Apologized to the patient for her inconvenience and reviewed our plan for accomplishing the test tomorrow.  Nigel BridgemanVicki Maurisio Ruddy, CNM 10/10/14 6:20p

## 2014-10-10 NOTE — Addendum Note (Signed)
Addendum created 10/10/14 1433 by Elbert Ewingsolleen S Daric Koren, CRNA   Modules edited: Notes Section   Notes Section:  File: [id number]

## 2014-10-11 ENCOUNTER — Inpatient Hospital Stay (HOSPITAL_COMMUNITY)
Admission: AD | Admit: 2014-10-11 | Discharge: 2014-10-11 | Disposition: A | Discharge status: Home / Self Care | Payer: 59 | Source: Ambulatory Visit | Attending: Obstetrics and Gynecology | Admitting: Obstetrics and Gynecology

## 2014-10-11 ENCOUNTER — Encounter (HOSPITAL_COMMUNITY): Payer: Self-pay | Admitting: *Deleted

## 2014-10-11 MED ORDER — GADOBENATE DIMEGLUMINE 529 MG/ML IV SOLN
15.0000 mL | Freq: Once | INTRAVENOUS | Status: AC | PRN
  Administered 2014-10-11: 11 mL via INTRAVENOUS

## 2014-10-11 NOTE — Progress Notes (Addendum)
  Subjective: Postpartum Day 2: Cesarean Delivery due to previous C/S, desires repeat Patient up ad lib, reports no syncope or dizziness.  Using Motrin and Percocet for pain with benefit Feeding:  Breast Contraceptive plan:  Undecided at present.  Objective: Vital signs in last 24 hours: Temp:  [98.3 F (36.8 C)] 98.3 F (36.8 C) (10/15 0555) Pulse Rate:  [73] 73 (10/15 0555) BP: (114)/(67) 114/67 mmHg (10/15 0555) SpO2:  [100 %] 100 % (10/15 0555)  Physical Exam:  General: alert Lochia: appropriate Uterine Fundus: firm Abdomen:  + bowel sounds Incision: Pressure dressing CDI, marked "sutures" DVT Evaluation: No evidence of DVT seen on physical exam. Negative Homan's sign. Vulvar exam:  Area less swollen than prior to delivery, still with more fullness on right than left.   Recent Labs  10/08/14 1503 10/10/14 0550  HGB 12.0 10.1*  HCT 35.3* 29.5*  WBC 9.1 11.1*   Scheduled for pelvic MRI at 1:15p today. RN has confirmed with MRI staff that the patient has sutures, not staples.  Assessment/Plan: Status post repeat cesarean delivery day 2. Stable Continue current care. MRI today for vulvar mass--[facility] 1:15p procedure. Dr. Sallye OberKulwa will do circumcision today. Plan for discharge tomorrow    Nyra CapesLATHAM, VICKICNM 10/11/2014, 8:56 AM   I saw and examined patient at bedside and agree with above findings, assessment and plan as per Gabriela BridgemanLatham, Gabriela CNM's progress note above.  Dr. Sallye OberKulwa.

## 2014-10-11 NOTE — Lactation Note (Signed)
This note was copied from the chart of [facility]. Lactation Consultation Note Experienced BF mom of 8 months w/1st child. Mom states this baby is cluster feeding now. Has supplemented some w/formula. C/o soreness from baby biting when latches. Comfort gels given. Encouraged to do chin tug if latch is painful. Demonstrated hand expression w/good colostrum flow noted. Moms breast are filling, encouraged to massage as feeding. Assisted w/positioning for deeper latch. No bruising noted to nipples.  Educated about newborn behavior. Referred to Baby and Me Book in Breastfeeding section Pg. 22-23 for position options and Proper latch demonstration.Encouraged comfort during BF so colostrum flows better and mom will enjoy the feeding longer. Taking deep breaths and breast massage during BF. Mom encouraged to do skin-to-skin during BF. WH/LC brochure given w/resources, support groups and LC services. Patient Name: Boy Loney HeringMona Formby ZOXWR'UToday's Date: 10/11/2014 Reason for consult: Initial assessment   Maternal Data Has patient been taught Hand Expression?: Yes Does the patient have breastfeeding experience prior to this delivery?: Yes  Feeding Feeding Type: Breast Fed Length of feed: 30 min  LATCH Score/Interventions Latch: Grasps breast easily, tongue down, lips flanged, rhythmical sucking. Intervention(s): Skin to skin;Teach feeding cues;Waking techniques Intervention(s): Adjust position;Assist with latch;Breast massage;Breast compression  Audible Swallowing: Spontaneous and intermittent Intervention(s): Skin to skin;Hand expression Intervention(s): Skin to skin;Hand expression;Alternate breast massage  Type of Nipple: Everted at rest and after stimulation  Comfort (Breast/Nipple): Filling, red/small blisters or bruises, mild/mod discomfort  Problem noted: Mild/Moderate discomfort Interventions (Mild/moderate discomfort): Hand massage;Hand expression  Hold (Positioning): Assistance needed to  correctly position infant at breast and maintain latch. Intervention(s): Breastfeeding basics reviewed;Support Pillows;Position options;Skin to skin  LATCH Score: 8  Lactation Tools Discussed/Used     Consult Status Consult Status: Follow-up Date: 10/11/14 Follow-up type: In-patient    Kayelee Herbig, Diamond NickelLAURA G 10/11/2014, 1:17 AM

## 2014-10-11 NOTE — Progress Notes (Signed)
Spoke with patient regarding pelvic MRI results: IMPRESSION:  5.0 cm benign appearing cyst in the central vulva. 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 No associated soft tissue mass  identified.  Postpartum uterus with recent postop changes from C-section.  Normal appearance of both ovaries. No adnexal mass identified.   Reviewed results with Dr. Sallye OberKulwa. Will continue to observe area, re-evaluate at 6 week pp visit. Reassured patient that no pathology noted. OK for breastfeeding after MRI contrast per lactation consult on 10/10/14. Anticipate d/c tomorrow.  Gabriela BridgemanVicki Aeisha Le, CNM 10/11/14  11:30p

## 2014-10-12 MED ORDER — IBUPROFEN 600 MG PO TABS: 600.0000 mg | ORAL_TABLET | Freq: Four times a day (QID) | ORAL | Status: DC

## 2014-10-12 MED ORDER — OXYCODONE-ACETAMINOPHEN 5-325 MG PO TABS: 1.0000 | ORAL_TABLET | ORAL | Status: DC | PRN

## 2014-10-12 NOTE — Discharge Summary (Signed)
Cesarean Section Delivery Discharge Summary  Gabriela Le  DOB:    11/17/1976 MRN:    [id number] CSN:    [id number]  Date of admission:                  10/09/2014  Date of discharge:                   10/12/2014  Procedures this admission: Repeat C/S and Pelvic MRI for Vulvar Mass  Date of Delivery: 10/09/2014  Newborn Data:  Live born female  Birth Weight: 7 lb 3.7 oz (3280 g) APGAR: 8, 9  Home with mother. Name: Unknown Circumcision Plan: Inpatient  History of Present Illness:  Gabriela Le is a 38 y.o. female, [id number], who presents at 3655w2d weeks gestation. [facility]    Her pregnancy has been complicated by:  Patient Active Problem List   Diagnosis Date Noted  . Status post repeat low transverse cesarean section 10/10/2014  . Elderly multigravida with antepartum condition or complication 10/06/2014  . Previous cesarean delivery, antepartum condition or complication 10/06/2014  . Vulvar mass--MRI planned 10/06/2014  . Hyperemesis 10/06/2014  . 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   Her postpartum course was not complicated. However, patient did receive pelvic MRI for vulvar mass.  Findings "5.0 cm benign appearing cyst in the central vulva. 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 No associated soft tissue mass identified. Postpartum uterus with recent postop changes from C-section. Normal appearance of both ovaries. No adnexal mass identified"  She was discharged to home on postpartum day 3 doing well.  It is of note that prior to discharge patient was complaining of dizziness and ringing in her ears.  After assessment, noted that patient was not eating complete meals and instead substituting with  "protein drink of milk and flour."  Patient VSS and she was educated on proper nutrition for her health and breastfeeding.  Patient and husband verbalized understanding and encouraged to call if any issues should arise.  Patient discharged and denies issues of dizziness, ringing in ears, and syncope.     Feeding:  breast  Contraception:  no method  Discharge hemoglobin:  Hemoglobin  Date Value Ref Range Status  10/10/2014 10.1* 12.0 - 15.0 g/dL Final     HCT  Date Value Ref Range Status  10/10/2014 29.5* 36.0 - 46.0 % Final    Discharge Physical Exam:   General: alert, cooperative and no distress Lochia: appropriate Uterine Fundus: firm U/-2 Incision: no significant drainage, Honeycomb dressing CDI DVT Evaluation: No evidence of DVT seen on physical exam. Negative Homan's sign. No cords or calf tenderness.  Intrapartum Procedures: cesarean: low cervical, transverse Postpartum Procedures: Pelvic MRI Complications-Operative and Postpartum: none  Discharge Diagnoses: Term Pregnancy-delivered and S/P Repeat C/S, Breastfeeding  Discharge Information:  Activity:           pelvic rest Diet:                routine Medications: Ibuprofen and Percocet Condition:      stable Instructions: Pain Management, Incisional Care, Breastfeeding, Nutrition, Who and When to call for postpartum complications.  Care After Cesarean Delivery  Refer to this sheet in the next few weeks. These instructions provide you with  information on caring for yourself after your procedure. Your caregiver may also give you specific instructions. Your treatment has been planned according to current medical practices, but problems sometimes occur. Call your caregiver if you have any problems or questions after you go home. HOME CARE INSTRUCTIONS  Only take over-the-counter or prescription medicines as directed by your caregiver.  Do not drink alcohol, especially if you are breastfeeding or taking medicine  to relieve pain.  Do not chew or smoke tobacco.  Continue to use good perineal care. Good perineal care includes:  Wiping your perineum from front to back.  Keeping your perineum clean.  Check your cut (incision) daily for increased redness, drainage, swelling, or separation of skin.  Clean your incision gently with soap and water every day, and then pat it dry. If your caregiver says it is okay, leave the incision uncovered. Use a bandage (dressing) if the incision is draining fluid or appears irritated. If the adhesive strips across the incision do not fall off within 7 days, carefully peel them off.  Hug a pillow when coughing or sneezing until your incision is healed. This helps to relieve pain.  Do not use tampons or douche until your caregiver says it is okay.  Shower, wash your hair, and take tub baths as directed by your caregiver.  Wear a well-fitting bra that provides breast support.  Limit wearing support panties or control-top hose.  Drink enough fluids to keep your urine clear or pale yellow.  Eat high-fiber foods such as whole grain cereals and breads, brown rice, beans, and fresh fruits and vegetables every day. These foods may help prevent or relieve constipation.  Resume activities such as climbing stairs, driving, lifting, exercising, or traveling as directed by your caregiver.  Talk to your caregiver about resuming sexual activities. This is dependent upon your risk of infection, your rate of healing, and your comfort and desire to resume sexual activity.  Try to have someone help you with your household activities and your newborn for at least a few days after you leave the hospital.  Rest as much as possible. Try to rest or take a nap when your newborn is sleeping.  Increase your activities gradually.  Keep all of your scheduled postpartum appointments. It is very important to keep your scheduled follow-up appointments. At these appointments, your caregiver  will be checking to make sure that you are healing physically and emotionally. SEEK MEDICAL CARE IF:   You are passing large clots from your vagina. Save any clots to show your caregiver.  You have a foul smelling discharge from your vagina.  You have trouble urinating.  You are urinating frequently.  You have pain when you urinate.  You have a change in your bowel movements.  You have increasing redness, pain, or swelling near your incision.  You have pus draining from your incision.  Your incision is separating.  You have painful, hard, or reddened breasts.  You have a severe headache.  You have blurred vision or see spots.  You feel sad or depressed.  You have thoughts of hurting yourself or your newborn.  You have questions about your care, the care of your newborn, or medicines.  You are dizzy or lightheaded.  You have a rash.  You have pain, redness, or swelling at the site of the removed intravenous access (IV) tube.  You have nausea or vomiting.  You stopped breastfeeding and have not had a menstrual period within 12 weeks of stopping.  You are not breastfeeding and have not had a menstrual period within 12 weeks of delivery.  You have a fever. SEEK IMMEDIATE MEDICAL CARE IF:  You have persistent pain.  You have chest pain.  You have shortness of breath.  You faint.  You have leg pain.  You have stomach pain.  Your vaginal bleeding saturates 2 or more sanitary pads in 1 hour. MAKE SURE YOU:   Understand these instructions.  Will watch your condition.  Will get help right away if you are not doing well or get worse. Document Released: 09/05/2002 Document Revised: 09/07/2012 Document Reviewed: 08/10/2012 [facility] Patient Information 2014 [place] [employer] [place].   Postpartum Depression and Baby Blues  The postpartum period begins right after the birth of a baby. During this time, there is often a great amount of joy and excitement. It is also a  time of considerable changes in the life of the parent(s). Regardless of how many times a mother gives birth, each child brings new challenges and dynamics to the family. It is not unusual to have feelings of excitement accompanied by confusing shifts in moods, emotions, and thoughts. All mothers are at risk of developing postpartum depression or the "baby blues." These mood changes can occur right after giving birth, or they may occur many months after giving birth. The baby blues or postpartum depression can be mild or severe. Additionally, postpartum depression can resolve rather quickly, or it can be a long-term condition. CAUSES Elevated hormones and their rapid decline are thought to be a main cause of postpartum depression and the baby blues. There are a number of hormones that radically change during and after pregnancy. Estrogen and progesterone usually decrease immediately after delivering your baby. The level of thyroid hormone and various cortisol steroids also rapidly drop. Other factors that play a major role in these changes include major life events and genetics.  RISK FACTORS If you have any of the following risks for the baby blues or postpartum depression, know what symptoms to watch out for during the postpartum period. Risk factors that may increase the likelihood of getting the baby blues or postpartum depression include:  Havinga personal or family history of depression.  Having depression while being pregnant.  Having premenstrual or oral contraceptive-associated mood issues.  Having exceptional life stress.  Having marital conflict.  Lacking a social support network.  Having a baby with special needs.  Having health problems such as diabetes. SYMPTOMS Baby blues symptoms include:  Brief fluctuations in mood, such as going from extreme happiness to sadness.  Decreased concentration.  Difficulty sleeping.  Crying spells,  tearfulness.  Irritability.  Anxiety. Postpartum depression symptoms typically begin within the first month after giving birth. These symptoms include:  Difficulty sleeping or excessive sleepiness.  Marked weight loss.  Agitation.  Feelings of worthlessness.  Lack of interest in activity or food. Postpartum psychosis is a very concerning condition and can be dangerous. Fortunately, it is rare. Displaying any of the following symptoms is cause for immediate medical attention. Postpartum psychosis symptoms include:  Hallucinations and delusions.  Bizarre or disorganized behavior.  Confusion or disorientation. DIAGNOSIS  A diagnosis is made by an evaluation of your symptoms. There are no medical or lab tests that lead to a diagnosis, but there are various questionnaires that a caregiver may use to identify those with the baby blues, postpartum depression, or psychosis. Often times, a screening tool called the [place] Postnatal Depression Scale is used to diagnose depression in the postpartum period.  TREATMENT The baby blues usually goes away on its own in 1 to 2 weeks. Social support is often all that is needed. You should be encouraged to get adequate sleep and rest. Occasionally, you may be given medicines to help you sleep.  Postpartum depression requires treatment as it can last several months or longer if it is not treated. Treatment may include individual or group therapy, medicine, or both to address any social, physiological, and psychological factors that may play a role in the depression. Regular exercise, a healthy diet, rest, and social support may also be strongly recommended.  Postpartum psychosis is more serious and needs treatment right away. Hospitalization is often needed. HOME CARE INSTRUCTIONS  Get as much rest as you can. Nap when the baby sleeps.  Exercise regularly. Some women find yoga and walking to be beneficial.  Eat a balanced and nourishing diet.  Do  little things that you enjoy. Have a cup of tea, take a bubble bath, read your favorite magazine, or listen to your favorite music.  Avoid alcohol.  Ask for help with household chores, cooking, grocery shopping, or running errands as needed. Do not try to do everything.  Talk to people close to you about how you are feeling. Get support from your partner, family members, friends, or other new moms.  Try to stay positive in how you think. Think about the things you are grateful for.  Do not spend a lot of time alone.  Only take medicine as directed by your caregiver.  Keep all your postpartum appointments.  Let your caregiver know if you have any concerns. SEEK MEDICAL CARE IF: You are having a reaction or problems with your medicine. SEEK IMMEDIATE MEDICAL CARE IF:  You have suicidal feelings.  You feel you may harm the baby or someone else. Document Released: 09/17/2004 Document Revised: 03/07/2012 Document Reviewed: 10/20/2011 [facility] Patient Information 2014 [place], [place].  Discharge to: home  Follow-up Information   Follow up with Elane Fritz, FNP On 11/20/2014. (11/20/2014 @ 11:30 am)    Specialty:  Family Medicine   Contact information:   [address] [place] [address] [phone]       Follow up with [facility] In 5 weeks. (Please call if you have any questions or concerns prior to your next visit.)    Specialty:  Obstetrics and Gynecology   Contact information:   [address] [place] [address] [phone]       Marlene Bast CNM, MSN 10/12/2014

## 2014-10-12 NOTE — Lactation Note (Signed)
This note was copied from the chart of [facility] Lactation Consultation Note  Baby is latched in football hold upon entering room. 7 % weight loss. Sucks and swallows observed, some with stimulation. LS8. Demonstrated how to roll baby tummy to tummy for a deeper, close latch. Reviewed how to massage breasts to keep baby active. Reviewed engorgement care, provided comfort gels for tender nipples. Mom encouraged to feed baby 8-12 times/24 hours and with feeding cues.  Suggest mother undress baby to wake baby for feedings.   Patient Name: Gabriela Loney HeringMona Le ZOXWR'UToday's Date: 10/12/2014 Reason for consult: Follow-up assessment   Maternal Data    Feeding Feeding Type: Breast Fed  LATCH Score/Interventions Latch: Grasps breast easily, tongue down, lips flanged, rhythmical sucking. (latched upon entering room) Intervention(s): Waking techniques Intervention(s): Adjust position  Audible Swallowing: A few with stimulation  Type of Nipple: Everted at rest and after stimulation  Comfort (Breast/Nipple): Filling, red/small blisters or bruises, mild/mod discomfort  Problem noted: Mild/Moderate discomfort Interventions (Mild/moderate discomfort): Comfort gels  Hold (Positioning): No assistance needed to correctly position infant at breast.  LATCH Score: 8  Lactation Tools Discussed/Used     Consult Status Consult Status: Follow-up Date: 10/13/14 Follow-up type: In-patient    Dahlia ByesBerkelhammer, Ruth [facility] 10/12/2014, 9:27 AM

## 2014-10-12 NOTE — Discharge Instructions (Signed)
Postpartum Depression and Baby Blues °The postpartum period begins right after the birth of a baby. During this time, there is often a great amount of joy and excitement. It is also a time of many changes in the life of the parents. Regardless of how many times a mother gives birth, each child brings new challenges and dynamics to the family. It is not unusual to have feelings of excitement along with confusing shifts in moods, emotions, and thoughts. All mothers are at risk of developing postpartum depression or the "baby blues." These mood changes can occur right after giving birth, or they may occur many months after giving birth. The baby blues or postpartum depression can be mild or severe. Additionally, postpartum depression can go away rather quickly, or it can be a long-term condition.  °CAUSES °Raised hormone levels and the rapid drop in those levels are thought to be a main cause of postpartum depression and the baby blues. A number of hormones change during and after pregnancy. Estrogen and progesterone usually decrease right after the delivery of your baby. The levels of thyroid hormone and various cortisol steroids also rapidly drop. Other factors that play a role in these mood changes include major life events and genetics.  °RISK FACTORS °If you have any of the following risks for the baby blues or postpartum depression, know what symptoms to watch out for during the postpartum period. Risk factors that may increase the likelihood of getting the baby blues or postpartum depression include: °· Having a personal or family history of depression.   °· Having depression while being pregnant.   °· Having premenstrual mood issues or mood issues related to oral contraceptives. °· Having a lot of life stress.   °· Having marital conflict.   °· Lacking a social support network.   °· Having a baby with special needs.   °· Having health problems, such as diabetes.   °SIGNS AND SYMPTOMS °Symptoms of baby blues  include: °· Brief changes in mood, such as going from extreme happiness to sadness. °· Decreased concentration.   °· Difficulty sleeping.   °· Crying spells, tearfulness.   °· Irritability.   °· Anxiety.   °Symptoms of postpartum depression typically begin within the first month after giving birth. These symptoms include: °· Difficulty sleeping or excessive sleepiness.   °· Marked weight loss.   °· Agitation.   °· Feelings of worthlessness.   °· Lack of interest in activity or food.   °Postpartum psychosis is a very serious condition and can be dangerous. Fortunately, it is rare. Displaying any of the following symptoms is cause for immediate medical attention. Symptoms of postpartum psychosis include:  °· Hallucinations and delusions.   °· Bizarre or disorganized behavior.   °· Confusion or disorientation.   °DIAGNOSIS  °A diagnosis is made by an evaluation of your symptoms. There are no medical or lab tests that lead to a diagnosis, but there are various questionnaires that a health care provider may use to identify those with the baby blues, postpartum depression, or psychosis. Often, a screening tool called the Edinburgh Postnatal Depression Scale is used to diagnose depression in the postpartum period.  °TREATMENT °The baby blues usually goes away on its own in 1-2 weeks. Social support is often all that is needed. You will be encouraged to get adequate sleep and rest. Occasionally, you may be given medicines to help you sleep.  °Postpartum depression requires treatment because it can last several months or longer if it is not treated. Treatment may include individual or group therapy, medicine, or both to address any social, physiological, and psychological   factors that may play a role in the depression. Regular exercise, a healthy diet, rest, and social support may also be strongly recommended.  °Postpartum psychosis is more serious and needs treatment right away. Hospitalization is often needed. °HOME CARE  INSTRUCTIONS °· Get as much rest as you can. Nap when the baby sleeps.   °· Exercise regularly. Some women find yoga and walking to be beneficial.   °· Eat a balanced and nourishing diet.   °· Do little things that you enjoy. Have a cup of tea, take a bubble bath, read your favorite magazine, or listen to your favorite music. °· Avoid alcohol.   °· Ask for help with household chores, cooking, grocery shopping, or running errands as needed. Do not try to do everything.   °· Talk to people close to you about how you are feeling. Get support from your partner, family members, friends, or other new moms. °· Try to stay positive in how you think. Think about the things you are grateful for.   °· Do not spend a lot of time alone.   °· Only take over-the-counter or prescription medicine as directed by your health care provider. °· Keep all your postpartum appointments.   °· Let your health care provider know if you have any concerns.   °SEEK MEDICAL CARE IF: °You are having a reaction to or problems with your medicine. °SEEK IMMEDIATE MEDICAL CARE IF: °· You have suicidal feelings.   °· You think you may harm the baby or someone else. °MAKE SURE YOU: °· Understand these instructions. °· Will watch your condition. °· Will get help right away if you are not doing well or get worse. °Document Released: 09/17/2004 Document Revised: 12/19/2013 Document Reviewed: 09/25/2013 °ExitCare® Patient Information ©2015 [employer]. This information is not intended to replace advice given to you by your health care provider. Make sure you discuss any questions you have with your health care provider. °Postpartum Care After Cesarean Delivery °After you deliver your newborn (postpartum period), the usual stay in the hospital is 24-72 hours. If there were problems with your labor or delivery, or if you have other medical problems, you might be in the hospital longer.  °While you are in the hospital, you will receive help and instructions  on how to care for yourself and your newborn during the postpartum period.  °While you are in the hospital: °· It is normal for you to have pain or discomfort from the incision in your abdomen. Be sure to tell your nurses when you are having pain, where the pain is located, and what makes the pain worse. °· If you are breastfeeding, you may feel uncomfortable contractions of your uterus for a couple of weeks. This is normal. The contractions help your uterus get back to normal size. °· It is normal to have some bleeding after delivery. °· For the first 1-3 days after delivery, the flow is red and the amount may be similar to a period. °· It is common for the flow to start and stop. °· In the first few days, you may pass some small clots. Let your nurses know if you begin to pass large clots or your flow increases. °· Do not  flush blood clots down the toilet before having the nurse look at them. °· During the next 3-10 days after delivery, your flow should become more watery and pink or brown-tinged in color. °· Ten to fourteen days after delivery, your flow should be a small amount of yellowish-white discharge. °· The amount of your flow   will decrease over the first few weeks after delivery. Your flow may stop in 6-8 weeks. Most women have had their flow stop by 12 weeks after delivery. °· You should change your sanitary pads frequently. °· Wash your hands thoroughly with soap and water for at least 20 seconds after changing pads, using the toilet, or before holding or feeding your newborn. °· Your intravenous (IV) tubing will be removed when you are drinking enough fluids. °· The urine drainage tube (urinary catheter) that was inserted before delivery may be removed within 6-8 hours after delivery or when feeling returns to your legs. You should feel like you need to empty your bladder within the first 6-8 hours after the catheter has been removed. °· In case you become weak, lightheaded, or faint, call your nurse  before you get out of bed for the first time and before you take a shower for the first time. °· Within the first few days after delivery, your breasts may begin to feel tender and full. This is called engorgement. Breast tenderness usually goes away within 48-72 hours after engorgement occurs. You may also notice milk leaking from your breasts. If you are not breastfeeding, do not stimulate your breasts. Breast stimulation can make your breasts produce more milk. °· Spending as much time as possible with your newborn is very important. During this time, you and your newborn can feel close and get to know each other. Having your newborn stay in your room (rooming in) will help to strengthen the bond with your newborn. It will give you time to get to know your newborn and become comfortable caring for your newborn. °· Your hormones change after delivery. Sometimes the hormone changes can temporarily cause you to feel sad or tearful. These feelings should not last more than a few days. If these feelings last longer than that, you should talk to your caregiver. °· If desired, talk to your caregiver about methods of family planning or contraception. °· Talk to your caregiver about immunizations. Your caregiver may want you to have the following immunizations before leaving the hospital: °· Tetanus, diphtheria, and pertussis (Tdap) or tetanus and diphtheria (Td) immunization. It is very important that you and your family (including grandparents) or others caring for your newborn are up-to-date with the Tdap or Td immunizations. The Tdap or Td immunization can help protect your newborn from getting ill. °· Rubella immunization. °· Varicella (chickenpox) immunization. °· Influenza immunization. You should receive this annual immunization if you did not receive the immunization during your pregnancy. °Document Released: 09/07/2012 Document Reviewed: 09/07/2012 °ExitCare® Patient Information ©2015 [employer]. This  information is not intended to replace advice given to you by your health care provider. Make sure you discuss any questions you have with your health care provider. ° °

## 2014-10-29 ENCOUNTER — Encounter (HOSPITAL_COMMUNITY): Payer: Self-pay | Admitting: *Deleted

## 2014-11-12 ENCOUNTER — Telehealth: Payer: Self-pay | Admitting: Internal Medicine

## 2014-11-12 NOTE — Telephone Encounter (Signed)
Ok to w/in Thx 

## 2014-11-12 NOTE — Telephone Encounter (Signed)
Pt requesting work in with Dr Posey ReaPlotnikov for stomach pain for 1 week. Pt will not see any other MD at this office. Pls advise

## 2014-11-13 ENCOUNTER — Ambulatory Visit: Payer: 59 | Admitting: Internal Medicine

## 2014-11-20 ENCOUNTER — Other Ambulatory Visit (INDEPENDENT_AMBULATORY_CARE_PROVIDER_SITE_OTHER): Payer: 59

## 2014-11-20 ENCOUNTER — Ambulatory Visit (INDEPENDENT_AMBULATORY_CARE_PROVIDER_SITE_OTHER): Payer: 59 | Admitting: Internal Medicine

## 2014-11-20 ENCOUNTER — Encounter: Payer: Self-pay | Admitting: Internal Medicine

## 2014-11-20 VITALS — BP 112/78 | HR 67 | Temp 98.1°F | Wt 115.0 lb

## 2014-11-20 DIAGNOSIS — R1013 Epigastric pain: Secondary | ICD-10-CM | POA: Insufficient documentation

## 2014-11-20 DIAGNOSIS — R197 Diarrhea, unspecified: Secondary | ICD-10-CM

## 2014-11-20 LAB — CBC WITH DIFFERENTIAL/PLATELET
BASOS ABS: 0 10*3/uL (ref 0.0–0.1)
Basophils Relative: 0.5 % (ref 0.0–3.0)
EOS ABS: 0 10*3/uL (ref 0.0–0.7)
Eosinophils Relative: 0.2 % (ref 0.0–5.0)
HEMATOCRIT: 39.1 % (ref 36.0–46.0)
Hemoglobin: 12.5 g/dL (ref 12.0–15.0)
LYMPHS ABS: 2.9 10*3/uL (ref 0.7–4.0)
Lymphocytes Relative: 32.9 % (ref 12.0–46.0)
MCHC: 32 g/dL (ref 30.0–36.0)
MCV: 92.5 fl (ref 78.0–100.0)
Monocytes Absolute: 0.4 10*3/uL (ref 0.1–1.0)
Monocytes Relative: 4.9 % (ref 3.0–12.0)
Neutro Abs: 5.4 10*3/uL (ref 1.4–7.7)
Neutrophils Relative %: 61.5 % (ref 43.0–77.0)
Platelets: 245 10*3/uL (ref 150.0–400.0)
RBC: 4.23 Mil/uL (ref 3.87–5.11)
RDW: 14.5 % (ref 11.5–15.5)
WBC: 8.8 10*3/uL (ref 4.0–10.5)

## 2014-11-20 LAB — HEPATIC FUNCTION PANEL
ALBUMIN: 3.5 g/dL (ref 3.5–5.2)
ALK PHOS: 86 U/L (ref 39–117)
ALT: 11 U/L (ref 0–35)
AST: 14 U/L (ref 0–37)
Bilirubin, Direct: 0.1 mg/dL (ref 0.0–0.3)
TOTAL PROTEIN: 6.8 g/dL (ref 6.0–8.3)
Total Bilirubin: 0.3 mg/dL (ref 0.2–1.2)

## 2014-11-20 LAB — BASIC METABOLIC PANEL
BUN: 10 mg/dL (ref 6–23)
CHLORIDE: 103 meq/L (ref 96–112)
CO2: 26 mEq/L (ref 19–32)
Calcium: 8.5 mg/dL (ref 8.4–10.5)
Creatinine, Ser: 0.6 mg/dL (ref 0.4–1.2)
GFR: 120.67 mL/min (ref >60.00)
GLUCOSE: 74 mg/dL (ref 70–99)
POTASSIUM: 3.7 meq/L (ref 3.5–5.1)
Sodium: 139 mEq/L (ref 135–145)

## 2014-11-20 LAB — H. PYLORI ANTIBODY, IGG: H Pylori IgG: NEGATIVE

## 2014-11-20 LAB — LIPASE: Lipase: 19 U/L (ref 11.0–59.0)

## 2014-11-20 MED ORDER — RANITIDINE HCL 150 MG PO CAPS: 150.0000 mg | ORAL_CAPSULE | Freq: Two times a day (BID) | ORAL | Status: DC

## 2014-11-20 NOTE — Progress Notes (Signed)
   Subjective:    HPI C/o pain and fullness sensation under in epig area and under B ribs - worse after meals; nausea, vomiting and diarrhea sometimes. Raynelle FanningMona had a C-section 2 mo ago. She is breastfeeding a little. Drinking a lot of milk. C/o L shoulder pain F/u hives - resolved She has been breat feeding    Review of Systems  Constitutional: Negative for activity change, appetite change and unexpected weight change.  HENT: Negative for mouth sores.   Eyes: Negative for visual disturbance.  Respiratory: Negative for chest tightness.   Gastrointestinal: Negative for nausea.  Genitourinary: Negative for frequency, difficulty urinating and vaginal pain.  Musculoskeletal: Negative for back pain and gait problem.  Skin: Negative for pallor.  Neurological: Negative for dizziness, tremors, weakness and numbness.  Psychiatric/Behavioral: Negative for confusion and sleep disturbance.       Objective:   Physical Exam  Constitutional: She appears well-developed. No distress.  HENT:  Head: Normocephalic.  Right Ear: External ear normal.  Left Ear: External ear normal.  Nose: Nose normal.  Mouth/Throat: Oropharynx is clear and moist.  Eyes: Conjunctivae are normal. Pupils are equal, round, and reactive to light. Right eye exhibits no discharge. Left eye exhibits no discharge.  Neck: Normal range of motion. Neck supple. No JVD present. No tracheal deviation present. No thyromegaly present.  Cardiovascular: Normal rate, regular rhythm and normal heart sounds.   Pulmonary/Chest: No stridor. No respiratory distress. She has no wheezes.  Abdominal: Soft. Bowel sounds are normal. She exhibits no distension and no mass. There is no tenderness. There is no rebound and no guarding.  Musculoskeletal: She exhibits no edema or tenderness.  Lymphadenopathy:    She has no cervical adenopathy.  Neurological: She displays normal reflexes. No cranial nerve deficit. She exhibits normal muscle tone.  Coordination normal.  Skin: No rash noted. No erythema.  Psychiatric: She has a normal mood and affect. Her behavior is normal. Judgment and thought content normal.  Sensitive epig area No mass, no HSM L shoulder is a little tender w/ROM         Assessment & Plan:  Patient ID: Gabriela Le, female   DOB: 04/22/1976, 38 y.o.   MRN: [id number]

## 2014-11-20 NOTE — Progress Notes (Signed)
Pre visit review using our clinic review tool, if applicable. No additional management support is needed unless otherwise documented below in the visit note. 

## 2014-11-20 NOTE — Assessment & Plan Note (Addendum)
11/15 US ok Zantac bid Labs  Lactose free diet

## 2014-11-20 NOTE — Patient Instructions (Signed)
Almond Breeze almond milk   Diet for Lactose Intolerance Lactose intolerance is when the body is not able to digest lactose, a sugar found in milk and milk products. Children with lactose intolerance should avoid consuming foods and drinks with lactose.  WHAT DO I NEED TO KNOW ABOUT THIS DIET?  Avoid giving your child foods with lactose.  Look for the words "lactose-free" or "lactose-reduced" on food labels. Your child can have lactose-free foods and may be able to have small amounts of lactose-reduced foods.  Make sure your child gets enough calcium. Give your child a calcium supplement if directed by your child's health care provider. Talk to the health care provider about a supplement if your child is not taking one. WHICH FOODS HAVE LACTOSE? Lactose is found in milk and milk products, such as:   Yogurt.  Cheese.  Butter.  Margarine.  Sour cream.  Creamer.  Whipped topping. Lactose is also found in foods made with milk or milk ingredients. To find out whether a food is made with milk or a milk ingredient, look at the ingredients list. Avoid foods with the statement "May contain milk" and foods that contain:   Butter.  Cream.  Milk.  Milk solids.  Whey. WHAT ARE SOME ALTERNATIVES TO MILK AND FOODS MADE WITH MILK PRODUCTS?  Lactose-free products, such as lactose-free milk.  Almond or rice milk.  Soy products, such as soy yogurt, soy cheese, soy ice cream, soy-based sour cream, and soy-based infant formula.  Nondairy products, such as nondairy creamers and nondairy whipped topping. Note that nondairy products sometimes contain lactose, so it is important to check the ingredients list. CAN MY CHILD HAVE ANY FOODS WITH LACTOSE? Some children with lactose intolerance can safely eat foods that have a little lactose. Foods with a little lactose have less than 1 g of lactose per serving. Examples of foods with a little lactose are:   Aged cheese (such as Swiss, cheddar,  or Parmesan cheese). One serving is about 1-2 oz.  Cream cheese. One serving is about 2 Tbsp.  Ricotta cheese. One serving is about  cup. If you give your child a food that has lactose:   Give your child only one food with lactose in it at a time.  Give your child a small amount of the food.  Stop giving your child the food if symptoms return. IS MY CHILD GETTING ENOUGH CALCIUM? Calcium is found in many foods with lactose and is important for bone health. The amount of calcium your child needs depends on his or her age:   Children 631-574 years old need about 270 mg of calcium a day.  Children 644-38 years old need about 800 mg of calcium a day.  Children [id number] years old need about 1300 mg of calcium a day. Make sure your child gets enough calcium by taking a calcium supplement or by eating lactose-free foods that are high in calcium, such as:   Orange juice with calcium added. There are 308-344 mg of calcium in 1 cup of orange juice.  Sardines with edible bones. There are 270 mg of calcium in 3 oz of sardines.  Canned salmon with edible bones. There are 205 mg of calcium in 3 oz of canned salmon.  Soy milk with calcium added. There are 200 mg of calcium in 1 cup of soy milk.  Raw broccoli. There are 90 mg of calcium in 1 cup of raw broccoli.  Orange. There are 50 mg of calcium in 1  medium orange.  Pinto beans. There are 40 mg of calcium in  cup of pinto beans.  Canned tuna. There are 10 mg of calcium in 3 oz canned tuna.  Lettuce greens. There are 10 mg of calcium in  cup of lettuce greens. Document Released: 12/19/2013 Document Revised: 04/30/2014 Document Reviewed: 12/19/2013 [facility] Patient Information 2015 [employer] HallExitCare, MarylandLL[place]. This information is not intended to replace advice given to you by your health care provider. Make sure you discuss any questions you have with your health care provider.

## 2014-11-20 NOTE — Assessment & Plan Note (Signed)
LACTOSE FREE DIET  LABS

## 2014-11-27 ENCOUNTER — Emergency Department (HOSPITAL_COMMUNITY): Payer: 59 | Admitting: Anesthesiology

## 2014-11-27 ENCOUNTER — Encounter (HOSPITAL_COMMUNITY)
Admission: EM | Disposition: A | Discharge status: Home / Self Care | Payer: Self-pay | Source: Home / Self Care | Attending: Emergency Medicine

## 2014-11-27 ENCOUNTER — Emergency Department (HOSPITAL_COMMUNITY): Payer: 59

## 2014-11-27 ENCOUNTER — Encounter (HOSPITAL_COMMUNITY): Payer: Self-pay | Admitting: *Deleted

## 2014-11-27 ENCOUNTER — Ambulatory Visit (HOSPITAL_COMMUNITY)
Admission: EM | Admit: 2014-11-27 | Discharge: 2014-11-29 | Disposition: A | Discharge status: Home / Self Care | Payer: 59 | Attending: Emergency Medicine | Admitting: Emergency Medicine

## 2014-11-27 DIAGNOSIS — K819 Cholecystitis, unspecified: Secondary | ICD-10-CM

## 2014-11-27 DIAGNOSIS — K810 Acute cholecystitis: Secondary | ICD-10-CM

## 2014-11-27 DIAGNOSIS — K219 Gastro-esophageal reflux disease without esophagitis: Secondary | ICD-10-CM | POA: Diagnosis not present

## 2014-11-27 DIAGNOSIS — K8010 Calculus of gallbladder with chronic cholecystitis without obstruction: Secondary | ICD-10-CM | POA: Insufficient documentation

## 2014-11-27 DIAGNOSIS — N926 Irregular menstruation, unspecified: Secondary | ICD-10-CM | POA: Diagnosis not present

## 2014-11-27 DIAGNOSIS — R1010 Upper abdominal pain, unspecified: Secondary | ICD-10-CM

## 2014-11-27 DIAGNOSIS — K8020 Calculus of gallbladder without cholecystitis without obstruction: Secondary | ICD-10-CM | POA: Diagnosis present

## 2014-11-27 HISTORY — PX: CHOLECYSTECTOMY: SHX55

## 2014-11-27 LAB — COMPREHENSIVE METABOLIC PANEL
ALBUMIN: 3.3 g/dL — AB (ref 3.5–5.2)
ALK PHOS: 94 U/L (ref 39–117)
ALT: 15 U/L (ref 0–35)
AST: 36 U/L (ref 0–37)
Anion gap: 13 (ref 5–15)
BUN: 13 mg/dL (ref 6–23)
CALCIUM: 9.1 mg/dL (ref 8.4–10.5)
CO2: 24 mEq/L (ref 19–32)
Chloride: 105 mEq/L (ref 96–112)
Creatinine, Ser: 0.56 mg/dL (ref 0.50–1.10)
GFR calc Af Amer: >90 mL/min (ref >90)
GFR calc non Af Amer: >90 mL/min (ref >90)
Glucose, Bld: 106 mg/dL — ABNORMAL HIGH (ref 70–99)
POTASSIUM: 4.9 meq/L (ref 3.7–5.3)
SODIUM: 142 meq/L (ref 137–147)
TOTAL PROTEIN: 7.2 g/dL (ref 6.0–8.3)
Total Bilirubin: <0.2 mg/dL — ABNORMAL LOW (ref 0.3–1.2)

## 2014-11-27 LAB — CBC WITH DIFFERENTIAL/PLATELET
BASOS ABS: 0 10*3/uL (ref 0.0–0.1)
BASOS PCT: 0 % (ref 0–1)
EOS ABS: 0 10*3/uL (ref 0.0–0.7)
EOS PCT: 0 % (ref 0–5)
HCT: 40.4 % (ref 36.0–46.0)
Hemoglobin: 13.3 g/dL (ref 12.0–15.0)
Lymphocytes Relative: 22 % (ref 12–46)
Lymphs Abs: 2.9 10*3/uL (ref 0.7–4.0)
MCH: 30.2 pg (ref 26.0–34.0)
MCHC: 32.9 g/dL (ref 30.0–36.0)
MCV: 91.6 fL (ref 78.0–100.0)
Monocytes Absolute: 0.9 10*3/uL (ref 0.1–1.0)
Monocytes Relative: 7 % (ref 3–12)
Neutro Abs: 9.3 10*3/uL — ABNORMAL HIGH (ref 1.7–7.7)
Neutrophils Relative %: 71 % (ref 43–77)
PLATELETS: 259 10*3/uL (ref 150–400)
RBC: 4.41 MIL/uL (ref 3.87–5.11)
RDW: 13.3 % (ref 11.5–15.5)
WBC: 13.1 10*3/uL — AB (ref 4.0–10.5)

## 2014-11-27 LAB — URINALYSIS, ROUTINE W REFLEX MICROSCOPIC
Bilirubin Urine: NEGATIVE
GLUCOSE, UA: NEGATIVE mg/dL
Ketones, ur: NEGATIVE mg/dL
Nitrite: NEGATIVE
PH: 5 (ref 5.0–8.0)
Protein, ur: NEGATIVE mg/dL
SPECIFIC GRAVITY, URINE: 1.022 (ref 1.005–1.030)
Urobilinogen, UA: 0.2 mg/dL (ref 0.0–1.0)

## 2014-11-27 LAB — URINE MICROSCOPIC-ADD ON

## 2014-11-27 LAB — POC URINE PREG, ED: Preg Test, Ur: NEGATIVE

## 2014-11-27 LAB — LIPASE, BLOOD: Lipase: 18 U/L (ref 11–59)

## 2014-11-27 SURGERY — LAPAROSCOPIC CHOLECYSTECTOMY WITH INTRAOPERATIVE CHOLANGIOGRAM
Anesthesia: General | Site: Abdomen

## 2014-11-27 MED ORDER — NEOSTIGMINE METHYLSULFATE 10 MG/10ML IV SOLN
INTRAVENOUS | Status: DC | PRN
  Administered 2014-11-27: 3 mg via INTRAVENOUS

## 2014-11-27 MED ORDER — GLYCOPYRROLATE 0.2 MG/ML IJ SOLN
INTRAMUSCULAR | Status: DC | PRN
  Administered 2014-11-27: 0.4 mg via INTRAVENOUS

## 2014-11-27 MED ORDER — IOHEXOL 300 MG/ML  SOLN
INTRAMUSCULAR | Status: DC | PRN
  Administered 2014-11-27: 4 mL via INTRAVENOUS

## 2014-11-27 MED ORDER — BUPIVACAINE-EPINEPHRINE 0.25% -1:200000 IJ SOLN
INTRAMUSCULAR | Status: AC
  Filled 2014-11-27: qty 1

## 2014-11-27 MED ORDER — KCL IN DEXTROSE-NACL 20-5-0.9 MEQ/L-%-% IV SOLN
INTRAVENOUS | Status: DC
  Administered 2014-11-27: 16:00:00 via INTRAVENOUS
  Administered 2014-11-28: 50 mL/h via INTRAVENOUS
  Administered 2014-11-29: 06:00:00 via INTRAVENOUS
  Filled 2014-11-27 (×3): qty 1000

## 2014-11-27 MED ORDER — FENTANYL CITRATE 0.05 MG/ML IJ SOLN
50.0000 ug | Freq: Once | INTRAMUSCULAR | Status: AC
  Administered 2014-11-27: 50 ug via INTRAVENOUS
  Filled 2014-11-27: qty 2

## 2014-11-27 MED ORDER — ONDANSETRON HCL 4 MG/2ML IJ SOLN
INTRAMUSCULAR | Status: AC
  Filled 2014-11-27: qty 2

## 2014-11-27 MED ORDER — ONDANSETRON HCL 4 MG/2ML IJ SOLN
4.0000 mg | Freq: Once | INTRAMUSCULAR | Status: AC
  Administered 2014-11-27: 4 mg via INTRAVENOUS
  Filled 2014-11-27: qty 2

## 2014-11-27 MED ORDER — ROCURONIUM BROMIDE 100 MG/10ML IV SOLN
INTRAVENOUS | Status: AC
  Filled 2014-11-27: qty 1

## 2014-11-27 MED ORDER — FENTANYL CITRATE 0.05 MG/ML IJ SOLN
INTRAMUSCULAR | Status: AC
Start: 2014-11-27 — End: 2014-11-27
  Filled 2014-11-27: qty 5

## 2014-11-27 MED ORDER — ONDANSETRON HCL 4 MG/2ML IJ SOLN
INTRAMUSCULAR | Status: DC | PRN
  Administered 2014-11-27: 4 mg via INTRAVENOUS

## 2014-11-27 MED ORDER — LACTATED RINGERS IV SOLN
INTRAVENOUS | Status: DC | PRN
  Administered 2014-11-27: 1000 mL via INTRAVENOUS

## 2014-11-27 MED ORDER — FAMOTIDINE 20 MG PO TABS
20.0000 mg | ORAL_TABLET | Freq: Two times a day (BID) | ORAL | Status: DC
  Administered 2014-11-27 – 2014-11-29 (×5): 20 mg via ORAL
  Filled 2014-11-27 (×5): qty 1

## 2014-11-27 MED ORDER — GLYCOPYRROLATE 0.2 MG/ML IJ SOLN
INTRAMUSCULAR | Status: AC
  Filled 2014-11-27: qty 2

## 2014-11-27 MED ORDER — ONDANSETRON HCL 4 MG/2ML IJ SOLN: 4.0000 mg | Freq: Four times a day (QID) | INTRAMUSCULAR | Status: DC | PRN

## 2014-11-27 MED ORDER — FENTANYL CITRATE 0.05 MG/ML IJ SOLN
100.0000 ug | Freq: Once | INTRAMUSCULAR | Status: AC
Start: 2014-11-27 — End: 2014-11-27
  Administered 2014-11-27: 100 ug via INTRAVENOUS
  Filled 2014-11-27: qty 2

## 2014-11-27 MED ORDER — SUCCINYLCHOLINE CHLORIDE 20 MG/ML IJ SOLN
INTRAMUSCULAR | Status: DC | PRN
  Administered 2014-11-27: 80 mg via INTRAVENOUS

## 2014-11-27 MED ORDER — FENTANYL CITRATE 0.05 MG/ML IJ SOLN
INTRAMUSCULAR | Status: DC | PRN
  Administered 2014-11-27: 50 ug via INTRAVENOUS
  Administered 2014-11-27: 100 ug via INTRAVENOUS

## 2014-11-27 MED ORDER — MEPERIDINE HCL 50 MG/ML IJ SOLN
6.2500 mg | INTRAMUSCULAR | Status: DC | PRN
  Administered 2014-11-27: 6.25 mg via INTRAVENOUS

## 2014-11-27 MED ORDER — HEPARIN SODIUM (PORCINE) 5000 UNIT/ML IJ SOLN
5000.0000 [IU] | Freq: Three times a day (TID) | INTRAMUSCULAR | Status: DC
  Administered 2014-11-28 – 2014-11-29 (×4): 5000 [IU] via SUBCUTANEOUS
  Filled 2014-11-27 (×5): qty 1

## 2014-11-27 MED ORDER — ACETAMINOPHEN 10 MG/ML IV SOLN
1000.0000 mg | Freq: Once | INTRAVENOUS | Status: AC
  Administered 2014-11-27: 1000 mg via INTRAVENOUS
  Filled 2014-11-27: qty 100

## 2014-11-27 MED ORDER — MORPHINE SULFATE 4 MG/ML IJ SOLN
4.0000 mg | INTRAMUSCULAR | Status: DC | PRN
Start: 2014-11-27 — End: 2014-11-29
  Administered 2014-11-27 – 2014-11-28 (×6): 4 mg via INTRAVENOUS
  Filled 2014-11-27 (×6): qty 1

## 2014-11-27 MED ORDER — MEPERIDINE HCL 50 MG/ML IJ SOLN
INTRAMUSCULAR | Status: AC
  Filled 2014-11-27: qty 1

## 2014-11-27 MED ORDER — DEXAMETHASONE SODIUM PHOSPHATE 10 MG/ML IJ SOLN
INTRAMUSCULAR | Status: AC
  Filled 2014-11-27: qty 1

## 2014-11-27 MED ORDER — LIDOCAINE HCL (PF) 2 % IJ SOLN
INTRAMUSCULAR | Status: DC | PRN
  Administered 2014-11-27: 20 mg via INTRADERMAL

## 2014-11-27 MED ORDER — DEXAMETHASONE SODIUM PHOSPHATE 10 MG/ML IJ SOLN
INTRAMUSCULAR | Status: DC | PRN
  Administered 2014-11-27: 10 mg via INTRAVENOUS

## 2014-11-27 MED ORDER — MIDAZOLAM HCL 2 MG/2ML IJ SOLN
INTRAMUSCULAR | Status: AC
  Filled 2014-11-27: qty 2

## 2014-11-27 MED ORDER — PROPOFOL 10 MG/ML IV BOLUS
INTRAVENOUS | Status: DC | PRN
  Administered 2014-11-27: 100 mg via INTRAVENOUS

## 2014-11-27 MED ORDER — CEFAZOLIN SODIUM 1-5 GM-% IV SOLN: 1.0000 g | Freq: Once | INTRAVENOUS | Status: DC

## 2014-11-27 MED ORDER — HYDROMORPHONE HCL 1 MG/ML IJ SOLN
1.0000 mg | Freq: Once | INTRAMUSCULAR | Status: AC
  Administered 2014-11-27: 1 mg via INTRAVENOUS
  Filled 2014-11-27: qty 1

## 2014-11-27 MED ORDER — MIDAZOLAM HCL 5 MG/5ML IJ SOLN
INTRAMUSCULAR | Status: DC | PRN
  Administered 2014-11-27: 2 mg via INTRAVENOUS

## 2014-11-27 MED ORDER — ONDANSETRON HCL 4 MG PO TABS
4.0000 mg | ORAL_TABLET | Freq: Four times a day (QID) | ORAL | Status: DC | PRN
  Administered 2014-11-27: 4 mg via ORAL
  Filled 2014-11-27: qty 1

## 2014-11-27 MED ORDER — CEFAZOLIN SODIUM-DEXTROSE 2-3 GM-% IV SOLR
2.0000 g | Freq: Once | INTRAVENOUS | Status: AC
  Administered 2014-11-27: 2 g via INTRAVENOUS
  Filled 2014-11-27: qty 50

## 2014-11-27 MED ORDER — OXYCODONE-ACETAMINOPHEN 5-325 MG PO TABS
1.0000 | ORAL_TABLET | ORAL | Status: DC | PRN
  Administered 2014-11-28 (×2): 2 via ORAL
  Administered 2014-11-29: 1 via ORAL
  Filled 2014-11-27 (×3): qty 2
  Filled 2014-11-27: qty 1

## 2014-11-27 MED ORDER — LACTATED RINGERS IV SOLN
INTRAVENOUS | Status: AC
  Administered 2014-11-27 (×2): 1000 mL via INTRAVENOUS

## 2014-11-27 MED ORDER — ROCURONIUM BROMIDE 100 MG/10ML IV SOLN
INTRAVENOUS | Status: DC | PRN
  Administered 2014-11-27: 20 mg via INTRAVENOUS

## 2014-11-27 MED ORDER — BUPIVACAINE-EPINEPHRINE 0.25% -1:200000 IJ SOLN
INTRAMUSCULAR | Status: DC | PRN
  Administered 2014-11-27: 30 mL

## 2014-11-27 MED ORDER — PROPOFOL 10 MG/ML IV BOLUS
INTRAVENOUS | Status: AC
  Filled 2014-11-27: qty 20

## 2014-11-27 MED ORDER — LIDOCAINE HCL (CARDIAC) 20 MG/ML IV SOLN
INTRAVENOUS | Status: AC
  Filled 2014-11-27: qty 5

## 2014-11-27 SURGICAL SUPPLY — 39 items
APPLIER CLIP 5 13 M/L LIGAMAX5 (MISCELLANEOUS) ×3
APPLIER CLIP ROT 10 11.4 M/L (STAPLE) ×3
CANISTER SUCT 3000ML (MISCELLANEOUS) IMPLANT
CATH REDDICK CHOLANGI 4FR 50CM (CATHETERS) ×3 IMPLANT
CHLORAPREP W/TINT 26ML (MISCELLANEOUS) ×3 IMPLANT
CLIP APPLIE 5 13 M/L LIGAMAX5 (MISCELLANEOUS) ×1 IMPLANT
CLIP APPLIE ROT 10 11.4 M/L (STAPLE) ×1 IMPLANT
COVER MAYO STAND STRL (DRAPES) ×3 IMPLANT
DECANTER SPIKE VIAL GLASS SM (MISCELLANEOUS) IMPLANT
DERMABOND ADVANCED (GAUZE/BANDAGES/DRESSINGS) ×2
DERMABOND ADVANCED .7 DNX12 (GAUZE/BANDAGES/DRESSINGS) ×1 IMPLANT
DRAPE C-ARM 42X120 X-RAY (DRAPES) ×3 IMPLANT
DRAPE CAMERA CLOSED 9X96 (DRAPES) ×3 IMPLANT
DRAPE LAPAROSCOPIC ABDOMINAL (DRAPES) ×3 IMPLANT
ELECT REM PT RETURN 9FT ADLT (ELECTROSURGICAL) ×3
ELECTRODE REM PT RTRN 9FT ADLT (ELECTROSURGICAL) ×1 IMPLANT
GLOVE BIO SURGEON STRL SZ7.5 (GLOVE) ×6 IMPLANT
GLOVE SURG SS PI 6.5 STRL IVOR (GLOVE) ×6 IMPLANT
GOWN STRL REUS W/TWL LRG LVL3 (GOWN DISPOSABLE) ×6 IMPLANT
GOWN STRL REUS W/TWL XL LVL3 (GOWN DISPOSABLE) ×3 IMPLANT
HEMOSTAT SURGICEL 4X8 (HEMOSTASIS) ×3 IMPLANT
IV CATH 14GX2 1/4 (CATHETERS) ×3 IMPLANT
KIT BASIN OR (CUSTOM PROCEDURE TRAY) ×3 IMPLANT
LIQUID BAND (GAUZE/BANDAGES/DRESSINGS) ×3 IMPLANT
NEEDLE HYPO 22GX1.5 SAFETY (NEEDLE) ×3 IMPLANT
PACK GENERAL/GYN (CUSTOM PROCEDURE TRAY) ×3 IMPLANT
POUCH SPECIMEN RETRIEVAL 10MM (ENDOMECHANICALS) ×3 IMPLANT
SET IRRIG TUBING LAPAROSCOPIC (IRRIGATION / IRRIGATOR) ×3 IMPLANT
SOLUTION ANTI FOG 6CC (MISCELLANEOUS) ×3 IMPLANT
SUT MNCRL AB 4-0 PS2 18 (SUTURE) ×6 IMPLANT
SUT VICRYL 0 UR6 27IN ABS (SUTURE) ×3 IMPLANT
TOWEL OR 17X26 10 PK STRL BLUE (TOWEL DISPOSABLE) ×3 IMPLANT
TOWEL OR NON WOVEN STRL DISP B (DISPOSABLE) ×3 IMPLANT
TRAY LAPAROSCOPIC (CUSTOM PROCEDURE TRAY) IMPLANT
TROCAR BLADELESS OPT 5 75 (ENDOMECHANICALS) ×3 IMPLANT
TROCAR SLEEVE XCEL 5X75 (ENDOMECHANICALS) ×6 IMPLANT
TROCAR XCEL BLUNT TIP 100MML (ENDOMECHANICALS) ×3 IMPLANT
TROCAR XCEL NON-BLD 11X100MML (ENDOMECHANICALS) ×3 IMPLANT
TUBING INSUFFLATION 10FT LAP (TUBING) ×3 IMPLANT

## 2014-11-27 NOTE — ED Notes (Signed)
US at bedside

## 2014-11-27 NOTE — Anesthesia Postprocedure Evaluation (Signed)
  Anesthesia Post-op Note  Patient: Gabriela GivensMona O Le  Procedure(s) Performed: Procedure(s) (LRB): LAPAROSCOPIC CHOLECYSTECTOMY WITH INTRAOPERATIVE CHOLANGIOGRAM (N/A)  Patient Location: PACU  Anesthesia Type: General  Level of Consciousness: awake and alert   Airway and Oxygen Therapy: Patient Spontanous Breathing  Post-op Pain: mild  Post-op Assessment: Post-op Vital signs reviewed, Patient's Cardiovascular Status Stable, Respiratory Function Stable, Patent Airway and No signs of Nausea or vomiting  Last Vitals:  Filed Vitals:   11/27/14 1345  BP: 124/70  Pulse: 69  Temp: 36.4 C  Resp: 15    Post-op Vital Signs: stable   Complications: No apparent anesthesia complications

## 2014-11-27 NOTE — Transfer of Care (Signed)
Immediate Anesthesia Transfer of Care Note  Patient: Gabriela GivensMona O Le  Procedure(s) Performed: Procedure(s) (LRB): LAPAROSCOPIC CHOLECYSTECTOMY WITH INTRAOPERATIVE CHOLANGIOGRAM (N/A)  Patient Location: PACU  Anesthesia Type: General  Level of Consciousness: sedated, patient cooperative and responds to stimulation  Airway & Oxygen Therapy: Patient Spontanous Breathing and Patient connected to face mask oxgen  Post-op Assessment: Report given to PACU RN and Post -op Vital signs reviewed and stable  Post vital signs: Reviewed and stable  Complications: No apparent anesthesia complications

## 2014-11-27 NOTE — ED Provider Notes (Signed)
CSN: [id number]     Arrival date & time 11/27/14  4098 History   First MD Initiated Contact with Patient 11/27/14 0654     Chief Complaint  Patient presents with  . Abdominal Pain    Patient speaks little English history is obtained using Pacific language interpreter (Consider location/radiation/quality/duration/timing/severity/associated sxs/prior Treatment) HPI Complains of upper abdominal pain radiating to her back onset 2 weeks ago. Pain is intermittent not brought on by anything relieved with vomiting. Today's episode started approximately 4:30 AM today. She vomited continuously between 445 and 5:15 AM. Today's pain worse with vomiting. She's been treated with ranitidine prescribed by her OB/GYN doctor at [facility], without relief. She denies fever she admits to nausea at present. No urinary symptoms last bowel movement last night, normal. No other associated symptoms denies fever denies shortness of breath. Pain is sharp Past Medical History  Diagnosis Date  . Irregular menses   . 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 H/O cold sores     ACYCLOVIR OINTMENT PRN  . Infertility associated with anovulation     HAS TAKEN CLOMID IN THE PAST  . GERD (gastroesophageal reflux disease)   . Missed ab 12/29/2010    no surgery required   Past Surgical History  Procedure Laterality Date  . Appendectomy      6 YOA  . Cesarean section  01/06/2013    Procedure: CESAREAN SECTION;  Surgeon: Hal Morales, MD;  Location: [facility];  Service: Obstetrics;  Laterality: N/A;  Primary  . Cesarean section N/A 10/09/2014    Procedure: CESAREAN SECTION;  Surgeon: Konrad Felix, MD;  Location: [facility];  Service: Obstetrics;  Laterality: N/A;   Family History  Problem Relation Age of Onset  . Heart disease Mother     PALPITATIONS  . Hypertension Mother    History  Substance Use Topics  . Smoking status: Never Smoker   . Smokeless tobacco: Never Used  . Alcohol Use: No   OB History    Gravida  Para Term Preterm AB TAB SAB Ectopic Multiple Living   3 2 2  1  0 1   2     Review of Systems  Constitutional: Negative.   HENT: Negative.   Respiratory: Negative.   Cardiovascular: Negative.   Gastrointestinal: Positive for nausea, vomiting and abdominal pain.  Genitourinary:       One month post C-section. Had vaginal bleeding 2 weeks post C-section which has stopped. No bleeding since. Not breast-feeding  Musculoskeletal: Negative.   Skin: Negative.   Neurological: Negative.   Psychiatric/Behavioral: Negative.   All other systems reviewed and are negative.     Allergies  Review of patient's allergies indicates no known allergies.  Home Medications   Prior to Admission medications   Medication Sig Start Date End Date Taking? Authorizing Provider  ranitidine (ZANTAC) 150 MG capsule Take 1 capsule (150 mg total) by mouth 2 (two) times daily. Patient taking differently: Take 150 mg by mouth daily as needed for heartburn.  11/20/14  Yes Aleksei Plotnikov V, MD   BP 137/74 mmHg  Pulse 73  Temp(Src) 98.2 F (36.8 C) (Oral)  Resp 18  SpO2 100% Physical Exam  Constitutional: She appears well-developed and well-nourished. No distress.  HENT:  Head: Normocephalic and atraumatic.  Eyes: Conjunctivae are normal. Pupils are equal, round, and reactive to light.  Neck: Neck supple. No tracheal deviation present. No thyromegaly present.  Cardiovascular: Normal rate and regular rhythm.   No murmur heard. Pulmonary/Chest: Effort  normal and breath sounds normal.  Abdominal: Soft. Bowel sounds are normal. She exhibits no distension. There is tenderness.  Tender at epigastrium and right upper quadrant.  Genitourinary:  Right flank tenderness  Musculoskeletal: Normal range of motion. She exhibits no edema or tenderness.  Neurological: She is alert. Coordination normal.  Skin: Skin is warm and dry. No rash noted.  Psychiatric: She has a normal mood and affect.  Nursing note and  vitals reviewed.   ED Course  Procedures (including critical care time) Labs Review Labs Reviewed  CBC WITH DIFFERENTIAL  COMPREHENSIVE METABOLIC PANEL  LIPASE, BLOOD  URINALYSIS, ROUTINE W REFLEX MICROSCOPIC  PREGNANCY, URINE    Imaging Review No results found.   EKG Interpretation   Date/Time:  Tuesday November 27 2014 06:41:10 EST Ventricular Rate:  65 PR Interval:  142 QRS Duration: 93 QT Interval:  385 QTC Calculation: 400 R Axis:   50 Text Interpretation:  Sinus rhythm Confirmed by OTTER  MD, OLGA ([id number]) on  11/27/2014 6:47:56 AM     Pain and nausea improved after treatment with intravenous opioids and antiemetics Results for orders placed or performed during the hospital encounter of 11/27/14  CBC with Differential  Result Value Ref Range   WBC 13.1 (H) 4.0 - 10.5 K/uL   RBC 4.41 3.87 - 5.11 MIL/uL   Hemoglobin 13.3 12.0 - 15.0 g/dL   HCT 21.340.4 08.636.0 - 57.846.0 %   MCV 91.6 78.0 - 100.0 fL   MCH 30.2 26.0 - 34.0 pg   MCHC 32.9 30.0 - 36.0 g/dL   RDW 46.913.3 62.911.5 - 52.815.5 %   Platelets 259 150 - 400 K/uL   Neutrophils Relative % 71 43 - 77 %   Neutro Abs 9.3 (H) 1.7 - 7.7 K/uL   Lymphocytes Relative 22 12 - 46 %   Lymphs Abs 2.9 0.7 - 4.0 K/uL   Monocytes Relative 7 3 - 12 %   Monocytes Absolute 0.9 0.1 - 1.0 K/uL   Eosinophils Relative 0 0 - 5 %   Eosinophils Absolute 0.0 0.0 - 0.7 K/uL   Basophils Relative 0 0 - 1 %   Basophils Absolute 0.0 0.0 - 0.1 K/uL  Comprehensive metabolic panel  Result Value Ref Range   Sodium 142 137 - 147 mEq/L   Potassium 4.9 3.7 - 5.3 mEq/L   Chloride 105 96 - 112 mEq/L   CO2 24 19 - 32 mEq/L   Glucose, Bld 106 (H) 70 - 99 mg/dL   BUN 13 6 - 23 mg/dL   Creatinine, Ser 4.130.56 0.50 - 1.10 mg/dL   Calcium 9.1 8.4 - 24.410.5 mg/dL   Total Protein 7.2 6.0 - 8.3 g/dL   Albumin 3.3 (L) 3.5 - 5.2 g/dL   AST 36 0 - 37 U/L   ALT 15 0 - 35 U/L   Alkaline Phosphatase 94 39 - 117 U/L   Total Bilirubin <0.2 (L) 0.3 - 1.2 mg/dL   GFR calc non  Af Amer >90 >90 mL/min   GFR calc Af Amer >90 >90 mL/min   Anion gap 13 5 - 15  Lipase, blood  Result Value Ref Range   Lipase 18 11 - 59 U/L  Urinalysis, Routine w reflex microscopic  Result Value Ref Range   Color, Urine YELLOW YELLOW   APPearance CLOUDY (A) CLEAR   Specific Gravity, Urine 1.022 1.005 - 1.030   pH 5.0 5.0 - 8.0   Glucose, UA NEGATIVE NEGATIVE mg/dL   Hgb urine dipstick SMALL (  A) NEGATIVE   Bilirubin Urine NEGATIVE NEGATIVE   Ketones, ur NEGATIVE NEGATIVE mg/dL   Protein, ur NEGATIVE NEGATIVE mg/dL   Urobilinogen, UA 0.2 0.0 - 1.0 mg/dL   Nitrite NEGATIVE NEGATIVE   Leukocytes, UA MODERATE (A) NEGATIVE  Urine microscopic-add on  Result Value Ref Range   Squamous Epithelial / LPF MANY (A) RARE   WBC, UA 7-10 <3 WBC/hpf   RBC / HPF 0-2 <3 RBC/hpf   Bacteria, UA MANY (A) RARE  [facility] preg[facility])  Result Value Ref Range   Preg Test, Ur NEGATIVE NEGATIVE   [place] Abdomen Limited  11/27/2014   CLINICAL DATA:  Upper abdominal pain 09/11/2013  EXAM: US ABDOMEN LIMITED - RIGHT UPPER QUADRANT  COMPARISON:  None.  FINDINGS: Gallbladder:  Tiny dependent shadowing calculi within gallbladder. Mild gallbladder wall thickening up to 5 mm thick. No sonographic Murphy sign or pericholecystic fluid  Common bile duct:  Diameter: Normal caliber 4 mm diameter  Liver:  Normal appearance.  No intrahepatic biliary dilatation.  No RIGHT upper quadrant free fluid.  IMPRESSION: Cholelithiasis with mild gallbladder wall thickening, though no sonographic Murphy sign or pericholecystic fluid are seen to confirm acute cholecystitis.  If acute cholecystitis remains a clinical concern, recommend radionuclide hepatobiliary imaging for further evaluation.   Electronically Signed   By: Ulyses SouthwardMark  Boles M.D.   On: 11/27/2014 08:31   Dg Abd Acute W/chest  11/27/2014   CLINICAL DATA:  Epigastric pain starting yesterday  EXAM: ACUTE ABDOMEN SERIES (ABDOMEN 2 VIEW & CHEST 1 VIEW)  COMPARISON:  None.   FINDINGS: Cardiomediastinal silhouette is unremarkable. No acute infiltrate or pleural effusion. No pulmonary edema. There is nonspecific nonobstructive bowel gas pattern. No free abdominal air. Moderate stool in left colon.  IMPRESSION: Negative abdominal radiographs.  No acute cardiopulmonary disease.   Electronically Signed   By: Natasha MeadLiviu  Pop M.D.   On: 11/27/2014 08:31    MDM  Clinically patient has acute cholecystitis with right upper quadrant tenderness, vomiting, leukocytosis. Surgical consult called Patient evaluated by surgery and taken to the operating room Diagnosis acute cholecystitis Final diagnoses:  None        Doug SouSam Arora Coakley, MD 11/27/14 1057

## 2014-11-27 NOTE — ED Notes (Signed)
Per EMS pt from home with c/o sudden abdominal pain RUQ and LUQ with nausea and vomiting, had similar episode last week for which her PCP prescribed ranitidin. Pt had c-section about a month ago without complications.

## 2014-11-27 NOTE — H&P (Signed)
Gabriela Le is an 38 y.o. female.   Chief Complaint: abdominal pain HPI: The patient is a 38 yo middle [place] female who presents with RUQ pain. The pain has been associated with nausea and vomiting. The pain is occurring almost everyday. U/S shows small stones in gallbladder with mild gallbladder wall thickening. LFT's are normal. She is about 2 months post partum from a c section.  Past Medical History  Diagnosis Date  . Irregular menses   . 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 H/O cold sores     ACYCLOVIR OINTMENT PRN  . Infertility associated with anovulation     HAS TAKEN CLOMID IN THE PAST  . GERD (gastroesophageal reflux disease)   . Missed ab 12/29/2010    no surgery required    Past Surgical History  Procedure Laterality Date  . Appendectomy      6 YOA  . Cesarean section  01/06/2013    Procedure: CESAREAN SECTION;  Surgeon: Eldred Manges, MD;  Location: [place];  Service: Obstetrics;  Laterality: N/A;  Primary  . Cesarean section N/A 10/09/2014    Procedure: CESAREAN SECTION;  Surgeon: Alinda Dooms, MD;  Location: [place] [facility];  Service: Obstetrics;  Laterality: N/A;    Family History  Problem Relation Age of Onset  . Heart disease Mother     PALPITATIONS  . Hypertension Mother    Social History:  reports that she has never smoked. She has never used smokeless tobacco. She reports that she does not drink alcohol or use illicit drugs.  Allergies: No Known Allergies   (Not in a hospital admission)  Results for orders placed or performed during the hospital encounter of 11/27/14 (from the past 48 hour(s))  CBC with Differential     Status: Abnormal   Collection Time: 11/27/14  6:53 AM  Result Value Ref Range   WBC 13.1 (H) 4.0 - 10.5 K/uL   RBC 4.41 3.87 - 5.11 MIL/uL   Hemoglobin 13.3 12.0 - 15.0 g/dL   HCT 40.4 36.0 - 46.0 %   MCV 91.6 78.0 - 100.0 fL   MCH 30.2 26.0 - 34.0 pg   MCHC 32.9 30.0 - 36.0 g/dL   RDW 13.3 11.5 - 15.5 %   Platelets 259 150 - 400 K/uL    Neutrophils Relative % 71 43 - 77 %   Neutro Abs 9.3 (H) 1.7 - 7.7 K/uL   Lymphocytes Relative 22 12 - 46 %   Lymphs Abs 2.9 0.7 - 4.0 K/uL   Monocytes Relative 7 3 - 12 %   Monocytes Absolute 0.9 0.1 - 1.0 K/uL   Eosinophils Relative 0 0 - 5 %   Eosinophils Absolute 0.0 0.0 - 0.7 K/uL   Basophils Relative 0 0 - 1 %   Basophils Absolute 0.0 0.0 - 0.1 K/uL  Comprehensive metabolic panel     Status: Abnormal   Collection Time: 11/27/14  6:53 AM  Result Value Ref Range   Sodium 142 137 - 147 mEq/L   Potassium 4.9 3.7 - 5.3 mEq/L   Chloride 105 96 - 112 mEq/L   CO2 24 19 - 32 mEq/L   Glucose, Bld 106 (H) 70 - 99 mg/dL   BUN 13 6 - 23 mg/dL   Creatinine, Ser 0.56 0.50 - 1.10 mg/dL   Calcium 9.1 8.4 - 10.5 mg/dL   Total Protein 7.2 6.0 - 8.3 g/dL   Albumin 3.3 (L) 3.5 - 5.2 g/dL   AST 36 0 - 37 U/L  Comment: SLIGHT HEMOLYSIS HEMOLYSIS AT THIS LEVEL MAY AFFECT RESULT    ALT 15 0 - 35 U/L   Alkaline Phosphatase 94 39 - 117 U/L   Total Bilirubin <0.2 (L) 0.3 - 1.2 mg/dL   GFR calc non Af Amer >90 >90 mL/min   GFR calc Af Amer >90 >90 mL/min    Comment: (NOTE) The eGFR has been calculated using the CKD EPI equation. This calculation has not been validated in all clinical situations. eGFR's persistently <90 mL/min signify possible Chronic Kidney Disease.    Anion gap 13 5 - 15  Lipase, blood     Status: None   Collection Time: 11/27/14  6:53 AM  Result Value Ref Range   Lipase 18 11 - 59 U/L  Urinalysis, Routine w reflex microscopic     Status: Abnormal   Collection Time: 11/27/14  7:55 AM  Result Value Ref Range   Color, Urine YELLOW YELLOW   APPearance CLOUDY (A) CLEAR   Specific Gravity, Urine 1.022 1.005 - 1.030   pH 5.0 5.0 - 8.0   Glucose, UA NEGATIVE NEGATIVE mg/dL   Hgb urine dipstick SMALL (A) NEGATIVE   Bilirubin Urine NEGATIVE NEGATIVE   Ketones, ur NEGATIVE NEGATIVE mg/dL   Protein, ur NEGATIVE NEGATIVE mg/dL   Urobilinogen, UA 0.2 0.0 - 1.0 mg/dL   Nitrite  NEGATIVE NEGATIVE   Leukocytes, UA MODERATE (A) NEGATIVE  Urine microscopic-add on     Status: Abnormal   Collection Time: 11/27/14  7:55 AM  Result Value Ref Range   Squamous Epithelial / LPF MANY (A) RARE   WBC, UA 7-10 <3 WBC/hpf   RBC / HPF 0-2 <3 RBC/hpf   Bacteria, UA MANY (A) RARE  POC urine preg, ED (not at [facility])     Status: None   Collection Time: 11/27/14  7:59 AM  Result Value Ref Range   Preg Test, Ur NEGATIVE NEGATIVE    Comment:        THE SENSITIVITY OF THIS METHODOLOGY IS >24 mIU/mL    US Abdomen Limited  11/27/2014   CLINICAL DATA:  Upper abdominal pain 09/11/2013  EXAM: US ABDOMEN LIMITED - RIGHT UPPER QUADRANT  COMPARISON:  None.  FINDINGS: Gallbladder:  Tiny dependent shadowing calculi within gallbladder. Mild gallbladder wall thickening up to 5 mm thick. No sonographic Murphy sign or pericholecystic fluid  Common bile duct:  Diameter: Normal caliber 4 mm diameter  Liver:  Normal appearance.  No intrahepatic biliary dilatation.  No RIGHT upper quadrant free fluid.  IMPRESSION: Cholelithiasis with mild gallbladder wall thickening, though no sonographic Murphy sign or pericholecystic fluid are seen to confirm acute cholecystitis.  If acute cholecystitis remains a clinical concern, recommend radionuclide hepatobiliary imaging for further evaluation.   Electronically Signed   By: Lavonia Dana M.D.   On: 11/27/2014 08:31   Dg Abd Acute W/chest  11/27/2014   CLINICAL DATA:  Epigastric pain starting yesterday  EXAM: ACUTE ABDOMEN SERIES (ABDOMEN 2 VIEW & CHEST 1 VIEW)  COMPARISON:  None.  FINDINGS: Cardiomediastinal silhouette is unremarkable. No acute infiltrate or pleural effusion. No pulmonary edema. There is nonspecific nonobstructive bowel gas pattern. No free abdominal air. Moderate stool in left colon.  IMPRESSION: Negative abdominal radiographs.  No acute cardiopulmonary disease.   Electronically Signed   By: Lahoma Crocker M.D.   On: 11/27/2014 08:31    Review of Systems   Constitutional: Negative.   HENT: Negative.   Eyes: Negative.   Respiratory: Negative.   Cardiovascular: Negative.  Gastrointestinal: Positive for nausea, vomiting and abdominal pain.  Genitourinary: Negative.   Musculoskeletal: Negative.   Skin: Negative.   Neurological: Negative.   Endo/Heme/Allergies: Negative.   Psychiatric/Behavioral: Negative.     Blood pressure 137/74, pulse 73, temperature 98.2 F (36.8 C), temperature source Oral, resp. rate 18, SpO2 100 %, unknown if currently breastfeeding. Physical Exam  Constitutional: She is oriented to person, place, and time. She appears well-developed and well-nourished.  HENT:  Head: Normocephalic and atraumatic.  Eyes: Conjunctivae and EOM are normal. Pupils are equal, round, and reactive to light.  Neck: Normal range of motion. Neck supple.  Cardiovascular: Normal rate, regular rhythm and normal heart sounds.   Respiratory: Effort normal and breath sounds normal.  GI: Soft. Bowel sounds are normal.  There is mild to moderate RUQ pain but no guarding or palpable mass. There is a well healed C section scar  Musculoskeletal: Normal range of motion.  Neurological: She is alert and oriented to person, place, and time.  Skin: Skin is warm and dry.  Psychiatric: She has a normal mood and affect. Her behavior is normal.     Assessment/Plan The patient appears to have symptomatic gallstones. Because of the risk of further painful episodes and possible pancreatitis I think she would benefit from having her gallbladder removed. I have discussed with her the risks and benefits of surgery to remove the gallbladder as well as some of the technical aspects and she understands and wishes to proceed. Plan for lap chole with ioc  TOTH III,Eliska Hamil S 11/27/2014, 10:08 AM

## 2014-11-27 NOTE — Op Note (Signed)
11/27/2014  12:19 PM  PATIENT:  Gabriela Le  38 y.o. female  PRE-OPERATIVE DIAGNOSIS:  gallstones  POST-OPERATIVE DIAGNOSIS:  gallstones   PROCEDURE:  Procedure(s): LAPAROSCOPIC CHOLECYSTECTOMY WITH INTRAOPERATIVE CHOLANGIOGRAM (N/A)  SURGEON:  Surgeon(s) and Role:    * Griselda MinerPaul Toth III, MD - Primary  PHYSICIAN ASSISTANT:   ASSISTANTS: none   ANESTHESIA:   general  EBL:     BLOOD ADMINISTERED:none  DRAINS: none   LOCAL MEDICATIONS USED:  MARCAINE     SPECIMEN:  Source of Specimen:  gallbladder  DISPOSITION OF SPECIMEN:  PATHOLOGY  COUNTS:  YES  TOURNIQUET:  * No tourniquets in log *  DICTATION: .Dragon Dictation   Procedure: After informed consent was obtained the patient was brought to the operating room and placed in the supine position on the operating room table. After adequate induction of general anesthesia the patient's abdomen was prepped with ChloraPrep allowed to dry and draped in usual sterile manner. The area below the umbilicus was infiltrated with quarter percent  Marcaine. A small incision was made with a 15 blade knife. The incision was carried down through the subcutaneous tissue bluntly with a hemostat and [employer] retractors. The linea alba was identified. The linea alba was incised with a 15 blade knife and each side was grasped with Coker clamps. The preperitoneal space was then probed with a hemostat until the peritoneum was opened and access was gained to the abdominal cavity. A 0 Vicryl pursestring stitch was placed in the fascia surrounding the opening. A Hassan cannula was then placed through the opening and anchored in place with the previously placed Vicryl purse string stitch. The abdomen was insufflated with carbon dioxide without difficulty. A laparoscope was inserted through the [facility] cannula in the right upper quadrant was inspected. Next the epigastric region was infiltrated with % Marcaine. A small incision was made with a 15 blade knife.  A 5 mm port was placed bluntly through this incision into the abdominal cavity under direct vision. Next 2 sites were chosen laterally on the right side of the abdomen for placement of 5 mm ports. Each of these areas was infiltrated with quarter percent Marcaine. Small stab incisions were made with a 15 blade knife. 5 mm ports were then placed bluntly through these incisions into the abdominal cavity under direct vision without difficulty. A blunt grasper was placed through the lateralmost 5 mm port and used to grasp the dome of the gallbladder and elevated anteriorly and superiorly. Another blunt grasper was placed through the other 5 mm port and used to retract the body and neck of the gallbladder. A dissector was placed through the epigastric port and using the electrocautery the peritoneal reflection at the gallbladder neck was opened. Blunt dissection was then carried out in this area until the gallbladder neck-cystic duct junction was readily identified and a good window was created. A single clip was placed on the gallbladder neck. A small  ductotomy was made just below the clip with laparoscopic scissors. A 14-gauge Angiocath was then placed through the anterior abdominal wall under direct vision. A Reddick cholangiogram catheter was then placed through the Angiocath and flushed. The catheter was then placed in the cystic duct and anchored in place with a clip. A cholangiogram was obtained that showed no filling defects good emptying into the duodenum an adequate length on the cystic duct. The anchoring clip and catheters were then removed from the patient. 3 clips were placed proximally on the cystic duct and the  duct was divided between the 2 sets of clips. Posterior to this the cystic artery was identified and again dissected bluntly in a circumferential manner until a good window  was created. 2 clips were placed proximally and one distally on the artery and the artery was divided between the 2 sets of  clips. Next a laparoscopic hook cautery device was used to separate the gallbladder from the liver bed. Prior to completely detaching the gallbladder from the liver bed the liver bed was inspected and several small bleeding points were coagulated with the electrocautery until the area was completely hemostatic. The gallbladder was then detached the rest of it from the liver bed without difficulty. A laparoscopic bag was inserted through the [facility] can[facility]. The gallbladder was placed within the bag and the bag was sealed. A laparoscope was then moved to the epigastric port. The gallbladder and bag were then removed with the [facility] cannula through the infraumbilical port without difficulty. The fascial defect was then closed with the previously placed Vicryl pursestring stitch as well as with another figure-of-eight 0 Vicryl stitch. The liver bed was inspected again and found to be hemostatic. The abdomen was irrigated with copious amounts of saline until the effluent was clear. The ports were then removed under direct vision without difficulty and were found to be hemostatic. The gas was allowed to escape. The skin incisions were all closed with interrupted 4-0 Monocryl subcuticular stitches. Dermabond dressings were applied. The patient tolerated the procedure well. At the end of the case all needle sponge and instrument counts were correct. The patient was then awakened and taken to recovery in stable condition  PLAN OF CARE: Admit for overnight observation  PATIENT DISPOSITION:  PACU - hemodynamically stable.   Delay start of Pharmacological VTE agent (>24hrs) due to surgical blood loss or risk of bleeding: no

## 2014-11-27 NOTE — ED Notes (Signed)
Bed: [id number] Expected date:  Expected time:  Means of arrival:  Comments: EMS 60F abd pain

## 2014-11-27 NOTE — Anesthesia Preprocedure Evaluation (Addendum)
Anesthesia Evaluation  Patient identified by MRN, date of birth, ID band Patient awake    Reviewed: Allergy & Precautions, H&P , NPO status , Patient's Chart, lab work & pertinent test results  History of Anesthesia Complications Negative for: history of anesthetic complications  Airway Mallampati: II  TM Distance: >3 FB Neck ROM: Full    Dental no notable dental hx. (+) Dental Advisory Given   Pulmonary neg pulmonary ROS,  breath sounds clear to auscultation  Pulmonary exam normal       Cardiovascular negative cardio ROS  Rhythm:Regular Rate:Normal     Neuro/Psych negative neurological ROS  negative psych ROS   GI/Hepatic Neg liver ROS, GERD-  Medicated and Controlled,  Endo/Other  negative endocrine ROS  Renal/GU negative Renal ROS  negative genitourinary   Musculoskeletal negative musculoskeletal ROS (+)   Abdominal   Peds negative pediatric ROS (+)  Hematology negative hematology ROS (+)   Anesthesia Other Findings   Reproductive/Obstetrics negative OB ROS                            Anesthesia Physical Anesthesia Plan  ASA: I and emergent  Anesthesia Plan: General   Post-op Pain Management:    Induction: Intravenous, Rapid sequence and Cricoid pressure planned  Airway Management Planned: Oral ETT  Additional Equipment:   Intra-op Plan:   Post-operative Plan: Extubation in OR  Informed Consent: I have reviewed the patients History and Physical, chart, labs and discussed the procedure including the risks, benefits and alternatives for the proposed anesthesia with the patient or authorized representative who has indicated his/her understanding and acceptance.   Dental advisory given  Plan Discussed with: CRNA  Anesthesia Plan Comments:        Anesthesia Quick Evaluation

## 2014-11-27 NOTE — Plan of Care (Signed)
Problem: Phase I Progression Outcomes Goal: Incision/dressings dry and intact Outcome: Completed/Met Date Met:  11/27/14

## 2014-11-28 ENCOUNTER — Encounter (HOSPITAL_COMMUNITY): Payer: Self-pay | Admitting: General Surgery

## 2014-11-28 MED ORDER — OXYCODONE-ACETAMINOPHEN 5-325 MG PO TABS: 1.0000 | ORAL_TABLET | ORAL | Status: DC | PRN

## 2014-11-28 MED ORDER — KETOROLAC TROMETHAMINE 30 MG/ML IJ SOLN
30.0000 mg | Freq: Three times a day (TID) | INTRAMUSCULAR | Status: DC
  Administered 2014-11-28 – 2014-11-29 (×4): 30 mg via INTRAVENOUS
  Filled 2014-11-28 (×5): qty 1

## 2014-11-28 MED ORDER — IBUPROFEN 200 MG PO TABS: 600.0000 mg | ORAL_TABLET | Freq: Four times a day (QID) | ORAL | Status: DC | PRN

## 2014-11-28 MED ORDER — PROMETHAZINE HCL 25 MG/ML IJ SOLN
12.5000 mg | Freq: Four times a day (QID) | INTRAMUSCULAR | Status: DC | PRN
  Administered 2014-11-28: 12.5 mg via INTRAVENOUS
  Filled 2014-11-28: qty 1

## 2014-11-28 MED ORDER — ACETAMINOPHEN 325 MG PO TABS: 650.0000 mg | ORAL_TABLET | Freq: Four times a day (QID) | ORAL | Status: DC | PRN

## 2014-11-28 MED ORDER — IBUPROFEN 200 MG PO TABS: ORAL_TABLET | ORAL | Status: DC

## 2014-11-28 NOTE — Progress Notes (Signed)
1 Day Post-Op  Subjective: Complaining of nausea and pain.  No vomiting so far as I can see. Objective: Vital signs in last 24 hours: Temp:  [97.5 F (36.4 C)-98.7 F (37.1 C)] 98.7 F (37.1 C) (12/01 2236) Pulse Rate:  [59-89] 82 (12/01 2236) Resp:  [11-18] 16 (12/01 2236) BP: (102-130)/(56-79) 112/62 mmHg (12/01 2236) SpO2:  [94 %-100 %] 100 % (12/01 2236) Weight:  [59.376 kg (130 lb 14.4 oz)] 59.376 kg (130 lb 14.4 oz) (12/01 1345)   Afebrile, VSS No labs Diet: clears Intake/Output from previous day: 12/01 0701 - 12/02 0700 In: 1000 [I.V.:1000] Out: -  Intake/Output this shift:    General appearance: alert, cooperative and no distress Resp: clear to auscultation bilaterally GI: soft, sore, port sites look fine, no distension few BS.  Lab Results:   Recent Labs  11/27/14 0653  WBC 13.1*  HGB 13.3  HCT 40.4  PLT 259    BMET  Recent Labs  11/27/14 0653  NA 142  K 4.9  CL 105  CO2 24  GLUCOSE 106*  BUN 13  CREATININE 0.56  CALCIUM 9.1   PT/INR No results for input(s): LABPROT, INR in the last 72 hours.   Recent Labs Lab 11/27/14 0653  AST 36  ALT 15  ALKPHOS 94  BILITOT <0.2*  PROT 7.2  ALBUMIN 3.3*     Lipase     Component Value Date/Time   LIPASE 18 11/27/2014 0653     Studies/Results: Dg Cholangiogram Operative  11/27/2014   CLINICAL DATA:  Cholelithiasis and cholecystitis.  EXAM: INTRAOPERATIVE CHOLANGIOGRAM  TECHNIQUE: Cholangiographic images from the C-arm fluoroscopic device were submitted for interpretation post-operatively. Please see the procedural report for the amount of contrast and the fluoroscopy time utilized.  COMPARISON:  None.  FINDINGS: Injected contrast shows no evidence of biliary dilatation or stones within the common bile duct. No evidence of common duct stricture or obstruction, with prompt emptying of contrast into the duodenum.  IMPRESSION: Negative. No evidence of choledocholithiasis or biliary obstruction.    Electronically Signed   By: Myles RosenthalJohn  Stahl M.D.   On: 11/27/2014 12:11   [place] Abdomen Limited  11/27/2014   CLINICAL DATA:  Upper abdominal pain 09/11/2013  EXAM: US ABDOMEN LIMITED - RIGHT UPPER QUADRANT  COMPARISON:  None.  FINDINGS: Gallbladder:  Tiny dependent shadowing calculi within gallbladder. Mild gallbladder wall thickening up to 5 mm thick. No sonographic Murphy sign or pericholecystic fluid  Common bile duct:  Diameter: Normal caliber 4 mm diameter  Liver:  Normal appearance.  No intrahepatic biliary dilatation.  No RIGHT upper quadrant free fluid.  IMPRESSION: Cholelithiasis with mild gallbladder wall thickening, though no sonographic Murphy sign or pericholecystic fluid are seen to confirm acute cholecystitis.  If acute cholecystitis remains a clinical concern, recommend radionuclide hepatobiliary imaging for further evaluation.   Electronically Signed   By: Ulyses SouthwardMark  Boles M.D.   On: 11/27/2014 08:31   Dg Abd Acute W/chest  11/27/2014   CLINICAL DATA:  Epigastric pain starting yesterday  EXAM: ACUTE ABDOMEN SERIES (ABDOMEN 2 VIEW & CHEST 1 VIEW)  COMPARISON:  None.  FINDINGS: Cardiomediastinal silhouette is unremarkable. No acute infiltrate or pleural effusion. No pulmonary edema. There is nonspecific nonobstructive bowel gas pattern. No free abdominal air. Moderate stool in left colon.  IMPRESSION: Negative abdominal radiographs.  No acute cardiopulmonary disease.   Electronically Signed   By: Natasha MeadLiviu  Pop M.D.   On: 11/27/2014 08:31    Medications: . famotidine  20 mg  Oral BID  . heparin subcutaneous  5,000 Units Subcutaneous 3 times per day    Assessment/Plan Cholecystitis/cholelithiasis LAPAROSCOPIC CHOLECYSTECTOMY WITH INTRAOPERATIVE CHOLANGIOGRAM (N/A) Post partum/C section 10/09/14   Plan:  Treat her nausea and pain, mobilize, advance diet.  Home when she can eat, drink, walk and voiding without issue.     LOS: 1 day    Gabriela Le 11/28/2014

## 2014-11-28 NOTE — Plan of Care (Signed)
Problem: Phase I Progression Outcomes Goal: OOB as tolerated unless otherwise ordered Outcome: Completed/Met Date Met:  11/28/14 Goal: Voiding-avoid urinary catheter unless indicated Outcome: Completed/Met Date Met:  11/28/14 Goal: Vital signs/hemodynamically stable Outcome: Completed/Met Date Met:  11/28/14

## 2014-11-28 NOTE — Progress Notes (Signed)
Ambulate in the hallway 2x with assistance. Tolerated lunch, still complaining of pain, PRN med given, noted with relief.

## 2014-11-28 NOTE — Discharge Instructions (Signed)
Laparoscopic Cholecystectomy, Care After °Refer to this sheet in the next few weeks. These instructions provide you with information on caring for yourself after your procedure. Your health care provider may also give you more specific instructions. Your treatment has been planned according to current medical practices, but problems sometimes occur. Call your health care provider if you have any problems or questions after your procedure. °WHAT TO EXPECT AFTER THE PROCEDURE °After your procedure, it is typical to have the following: °· Pain at your incision sites. You will be given pain medicines to control the pain. °· Mild nausea or vomiting. This should improve after the first 24 hours. °· Bloating and possibly shoulder pain from the gas used during the procedure. This will improve after the first 24 hours. °HOME CARE INSTRUCTIONS  °· Change bandages (dressings) as directed by your health care provider. °· Keep the wound dry and clean. You may wash the wound gently with soap and water. Gently blot or dab the area dry. °· Do not take baths or use swimming pools or hot tubs for 2 weeks or until your health care provider approves. °· Only take over-the-counter or prescription medicines as directed by your health care provider. °· Continue your normal diet as directed by your health care provider. °· Do not lift anything heavier than 10 pounds (4.5 kg) until your health care provider approves. °· Do not play contact sports for 1 week or until your health care provider approves. °SEEK MEDICAL CARE IF:  °· You have redness, swelling, or increasing pain in the wound. °· You notice yellowish-white fluid (pus) coming from the wound. °· You have drainage from the wound that lasts longer than 1 day. °· You notice a bad smell coming from the wound or dressing. °· Your surgical cuts (incisions) break open. °SEEK IMMEDIATE MEDICAL CARE IF:  °· You develop a rash. °· You have difficulty breathing. °· You have chest pain. °· You  have a fever. °· You have increasing pain in the shoulders (shoulder strap areas). °· You have dizzy episodes or faint while standing. °· You have severe abdominal pain. °· You feel sick to your stomach (nauseous) or throw up (vomit) and this lasts for more than 1 day. °Document Released: 12/14/2005 Document Revised: 10/04/2013 Document Reviewed: 07/26/2013 °ExitCare® Patient Information ©2015 [employer]. This information is not intended to replace advice given to you by your health care provider. Make sure you discuss any questions you have with your health care provider. ° °CC[facility] ______[facility] °LAPAROSCOPIC SURGERY: POST OP INSTRUCTIONS °Always review your discharge instruction sheet given to you by the facility where your surgery was performed. °IF YOU HAVE DISABILITY OR FAMILY LEAVE FORMS, YOU MUST BRING THEM TO THE OFFICE FOR PROCESSING.   °DO NOT GIVE THEM TO YOUR DOCTOR. ° °1. A prescription for pain medication may be given to you upon discharge.  Take your pain medication as prescribed, if needed.  If narcotic pain medicine is not needed, then you may take acetaminophen (Tylenol) or ibuprofen (Advil) as needed. °2. Take your usually prescribed medications unless otherwise directed. °3. If you need a refill on your pain medication, please contact your pharmacy.  They will contact our office to request authorization. Prescriptions will not be filled after 5pm or on week-ends. °4. You should follow a light diet the first few days after arrival home, such as soup and crackers, etc.  Be sure to include lots of fluids daily. °5. Most patients will experience some   swelling and bruising in the area of the incisions.  Ice packs will help.  Swelling and bruising can take several days to resolve.  °6. It is common to experience some constipation if taking pain medication after surgery.  Increasing fluid intake and taking a stool softener (such as Colace) will usually help or prevent this problem  from occurring.  A mild laxative (Milk of Magnesia or Miralax) should be taken according to package instructions if there are no bowel movements after 48 hours. °7. Unless discharge instructions indicate otherwise, you may remove your bandages 24-48 hours after surgery, and you may shower at that time.  You may have steri-strips (small skin tapes) in place directly over the incision.  These strips should be left on the skin for 7-10 days.  If your surgeon used skin glue on the incision, you may shower in 24 hours.  The glue will flake off over the next 2-3 weeks.  Any sutures or staples will be removed at the office during your follow-up visit. °8. ACTIVITIES:  You may resume regular (light) daily activities beginning the next day--such as daily self-care, walking, climbing stairs--gradually increasing activities as tolerated.  You may have sexual intercourse when it is comfortable.  Refrain from any heavy lifting or straining until approved by your doctor. °a. You may drive when you are no longer taking prescription pain medication, you can comfortably wear a seatbelt, and you can safely maneuver your car and apply brakes. °b. RETURN TO WORK:  __________________________________________________________ °9. You should see your doctor in the office for a follow-up appointment approximately 2-3 weeks after your surgery.  Make sure that you call for this appointment within a day or two after you arrive home to insure a convenient appointment time. °10. OTHER INSTRUCTIONS: __________________________________________________________________________________________________________________________ __________________________________________________________________________________________________________________________ °WHEN TO CALL YOUR DOCTOR: °1. Fever over 101.0 °2. Inability to urinate °3. Continued bleeding from incision. °4. Increased pain, redness, or drainage from the incision. °5. Increasing abdominal pain ° °The  clinic staff is available to answer your questions during regular business hours.  Please don’t hesitate to call and ask to speak to one of the nurses for clinical concerns.  If you have a medical emergency, go to the nearest emergency room or call 911.  A surgeon from [facility] is always on call at the hospital. °[address], [address], [place], [place]  [address] ? [address] [address], [place], [place]   [address] °[phone] ? [phone] ? FAX [phone] °Web site: [email or web address] °

## 2014-11-29 MED ORDER — FLUCONAZOLE 150 MG PO TABS
150.0000 mg | ORAL_TABLET | Freq: Once | ORAL | Status: AC
Start: 2014-11-29 — End: 2014-11-29
  Administered 2014-11-29: 150 mg via ORAL
  Filled 2014-11-29: qty 1

## 2014-11-29 MED ORDER — IBUPROFEN 200 MG PO TABS: ORAL_TABLET | ORAL | Status: DC

## 2014-11-29 NOTE — Progress Notes (Signed)
2 Days Post-Op  Subjective: She is doing better, having some vaginal itching, she attributes to candida.  I will give her a single dose of diflucan.   She is ready to go home.  I spoke to her husband on the Phone and answered questions.  Objective: Vital signs in last 24 hours: Temp:  [98 F (36.7 C)-98.9 F (37.2 C)] 98.4 F (36.9 C) (12/03 0617) Pulse Rate:  [65-85] 71 (12/03 0617) Resp:  [16-18] 18 (12/03 0617) BP: (110-118)/(65-67) 110/66 mmHg (12/03 0617) SpO2:  [100 %] 100 % (12/03 0617) Last BM Date: 11/27/14 120 PO recorded Cardiac diet afebrile Intake/Output from previous day: 12/02 0701 - 12/03 0700 In: 670 [P.O.:120; I.V.:550] Out: -  Intake/Output this shift:    General appearance: alert GI: soft sore, + bS, tolerating the diet. port sites look fine.  Lab Results:   Recent Labs  11/27/14 0653  WBC 13.1*  HGB 13.3  HCT 40.4  PLT 259    BMET  Recent Labs  11/27/14 0653  NA 142  K 4.9  CL 105  CO2 24  GLUCOSE 106*  BUN 13  CREATININE 0.56  CALCIUM 9.1   PT/INR No results for input(s): LABPROT, INR in the last 72 hours.   Recent Labs Lab 11/27/14 0653  AST 36  ALT 15  ALKPHOS 94  BILITOT <0.2*  PROT 7.2  ALBUMIN 3.3*     Lipase     Component Value Date/Time   LIPASE 18 11/27/2014 0653     Studies/Results: Dg Cholangiogram Operative  11/27/2014   CLINICAL DATA:  Cholelithiasis and cholecystitis.  EXAM: INTRAOPERATIVE CHOLANGIOGRAM  TECHNIQUE: Cholangiographic images from the C-arm fluoroscopic device were submitted for interpretation post-operatively. Please see the procedural report for the amount of contrast and the fluoroscopy time utilized.  COMPARISON:  None.  FINDINGS: Injected contrast shows no evidence of biliary dilatation or stones within the common bile duct. No evidence of common duct stricture or obstruction, with prompt emptying of contrast into the duodenum.  IMPRESSION: Negative. No evidence of choledocholithiasis or  biliary obstruction.   Electronically Signed   By: Myles RosenthalJohn  Stahl M.D.   On: 11/27/2014 12:11    Medications: . famotidine  20 mg Oral BID  . heparin subcutaneous  5,000 Units Subcutaneous 3 times per day  . ketorolac  30 mg Intravenous 3 times per day    Assessment/Plan LAPAROSCOPIC CHOLECYSTECTOMY WITH INTRAOPERATIVE CHOLANGIOGRAM (N/A) Post partum/C section 10/09/14 Possible vaginiits    Plan:  She will go home today.   LOS: 2 days    Nyema Hachey 11/29/2014

## 2014-11-29 NOTE — Plan of Care (Signed)
Problem: Phase II Progression Outcomes Goal: Pain controlled Outcome: Completed/Met Date Met:  11/29/14 Goal: Progress activity as tolerated unless otherwise ordered Outcome: Completed/Met Date Met:  11/29/14 Goal: Vital signs stable Outcome: Completed/Met Date Met:  11/29/14

## 2014-12-02 NOTE — Discharge Summary (Signed)
Physician Discharge Summary  Patient ID: Gabriela BandaMona O Poe MRN: [id number] DOB/AGE: 38/12/1975 38 y.o.  Admit date: 11/27/2014 Discharge date: 11/30/2014  Admission Diagnoses:  Acute cholecystitis, cholelithiaisis Post partum/C section 10/09/14   Discharge Diagnoses:  Same Active Problems:   Gallstones   PROCEDURES: LAPAROSCOPIC CHOLECYSTECTOMY WITH INTRAOPERATIVE CHOLANGIOGRAM, 11/27/14, Dr. Bufford Lopeoth  Hospital Course: The patient is a 38 yo middle [place] female who presents with RUQ pain. The pain has been associated with nausea and vomiting. The pain is occurring almost everyday. U/S shows small stones in gallbladder with mild gallbladder wall thickening. LFT's are normal. She is about 2 months post partum from a c section.  She was admitted and underwent cholecystectomy on 11/27/14.  She did well post op, was slow to progress the first day with pain and nausea, she was ready for d/c the 2nd post op day.    Condition on d/c:  Improved    Disposition: 01-Home or Self Care     Medication List    TAKE these medications        acetaminophen 325 MG tablet  Commonly known as:  TYLENOL  Take 2 tablets (650 mg total) by mouth every 6 (six) hours as needed.     ibuprofen 200 MG tablet  Commonly known as:  ADVIL,MOTRIN  You can take 2 tablets every 6 hours for pain, as needed.     oxyCODONE-acetaminophen 5-325 MG per tablet  Commonly known as:  PERCOCET/ROXICET  Take 1-2 tablets by mouth every 4 (four) hours as needed for moderate pain.     ranitidine 150 MG capsule  Commonly known as:  ZANTAC  Take 1 capsule (150 mg total) by mouth 2 (two) times daily.           Follow-up Information    [facility] On 12/25/2014.   Why:  Your appointment is at 1:30PM, be there 30 minutes early for check in.   Contact information:   [address]   [place] [address] [phone]       Signed: Sherrie GeorgeJENNINGS,Shyniece Scripter 12/02/2014, 11:58 AM

## 2015-01-31 ENCOUNTER — Other Ambulatory Visit: Payer: Self-pay | Admitting: Obstetrics & Gynecology

## 2015-08-24 IMAGING — US US ABDOMEN LIMITED
1 series · 14 of 25 positions shown · non-contrast
Comparison: None.

CLINICAL DATA: Upper abdominal pain 09/11/2013

EXAM:
US ABDOMEN LIMITED - RIGHT UPPER QUADRANT

[Series 1: us abdomen limited · 0.27mm/px · 14 of 45 slices shown]
[im 1/45]
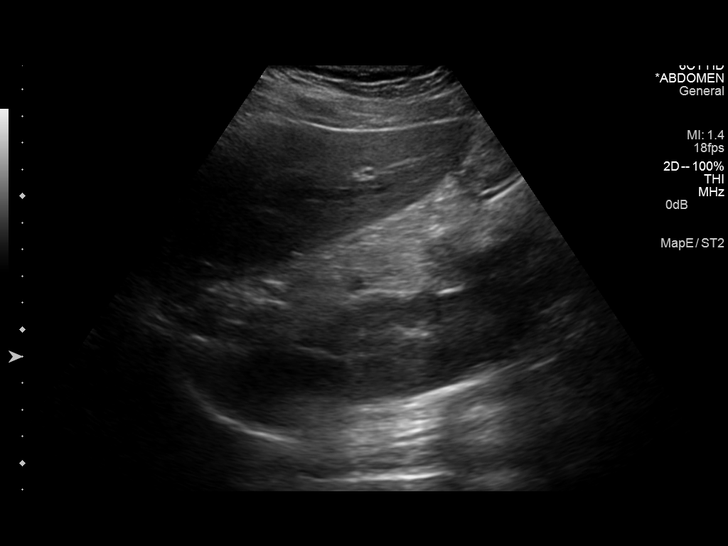
[im 4/45]
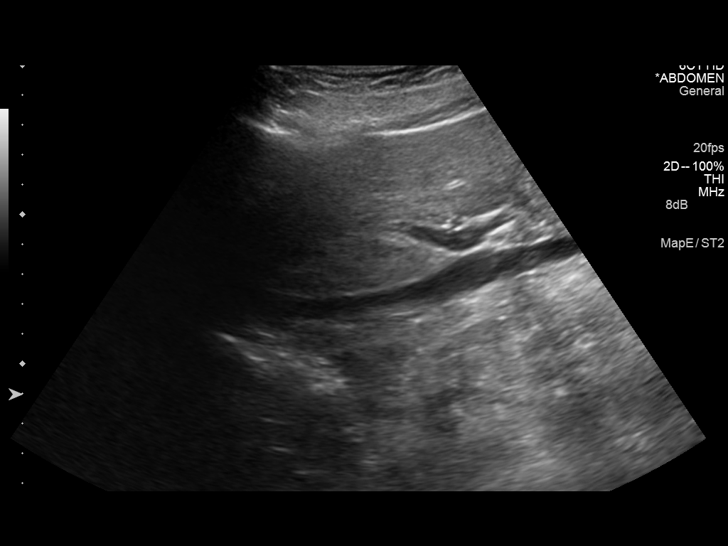
[im 8/45]
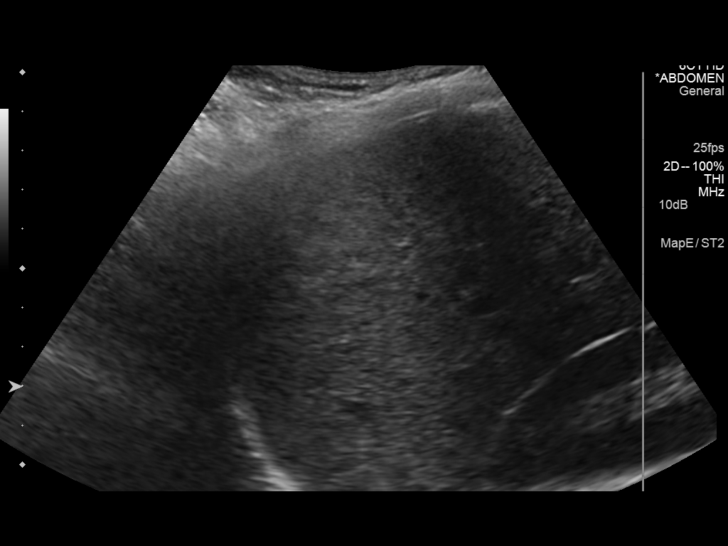
[im 12/45]
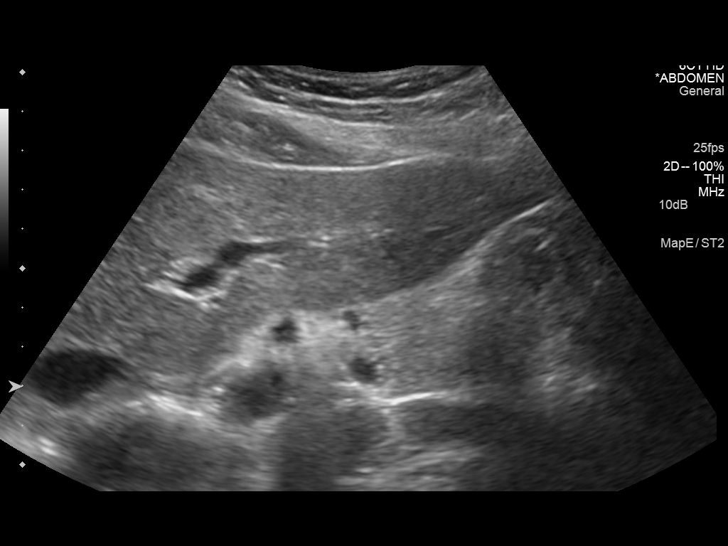
[im 15/45]
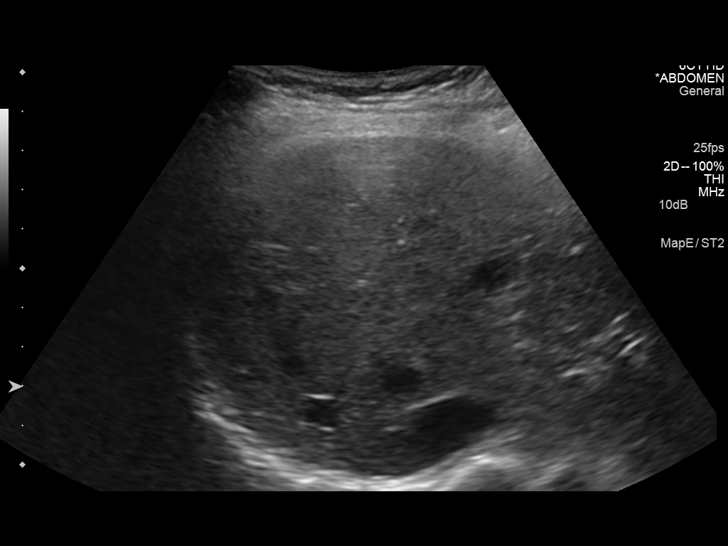
[im 17/45]
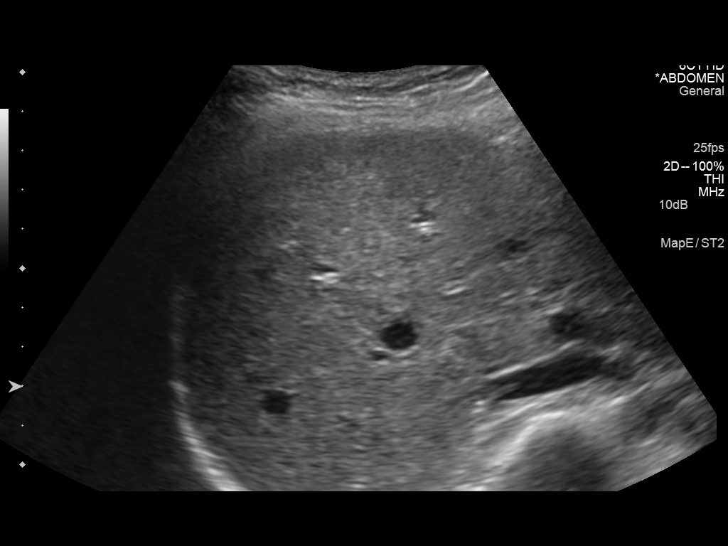
[im 21/45]
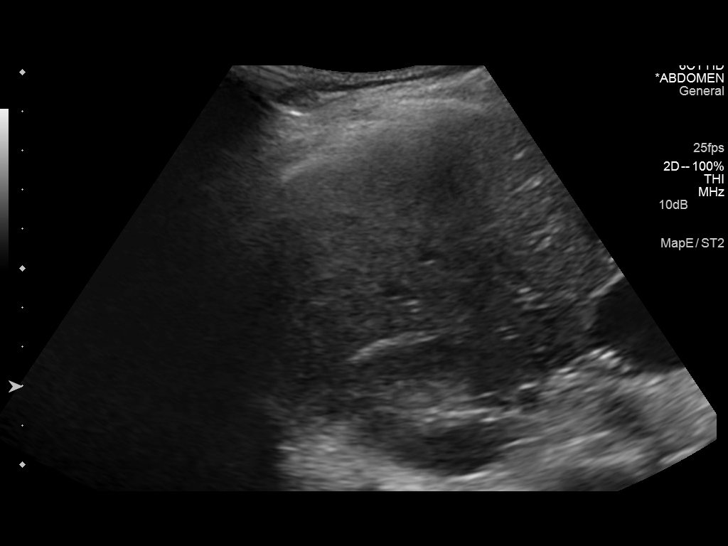
[im 24/45]
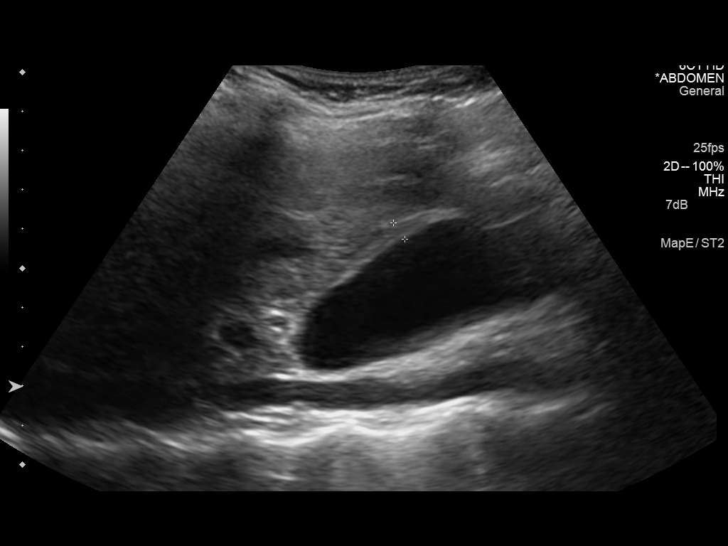
[im 28/45]
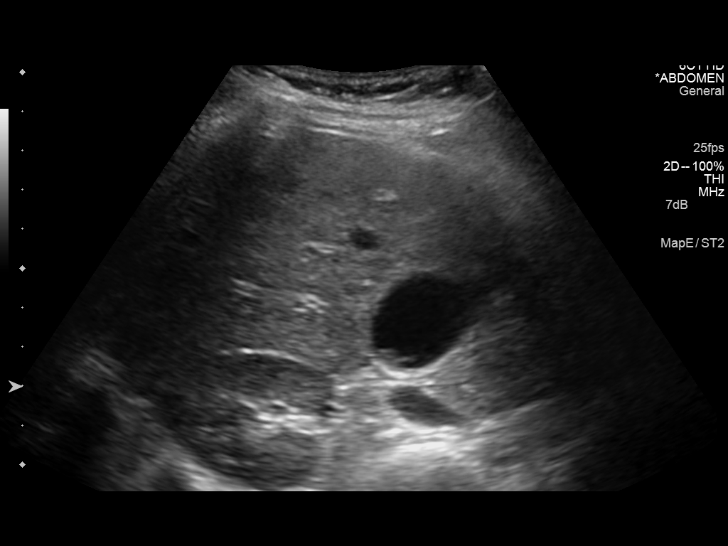
[im 30/45]
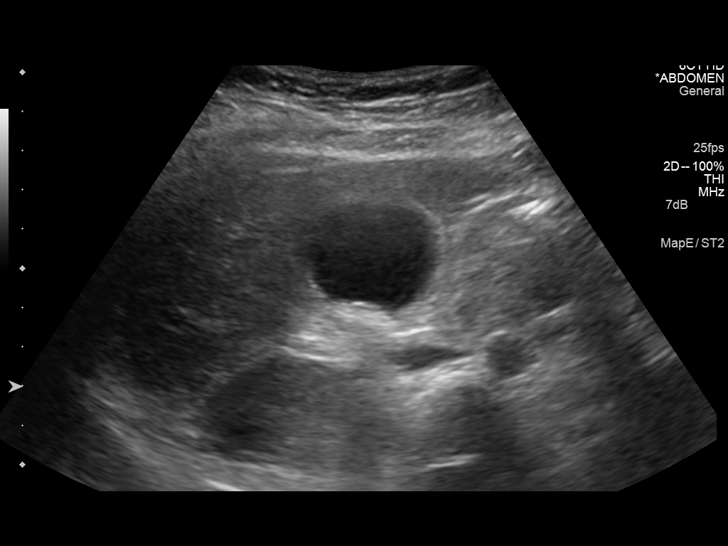
[im 34/45]
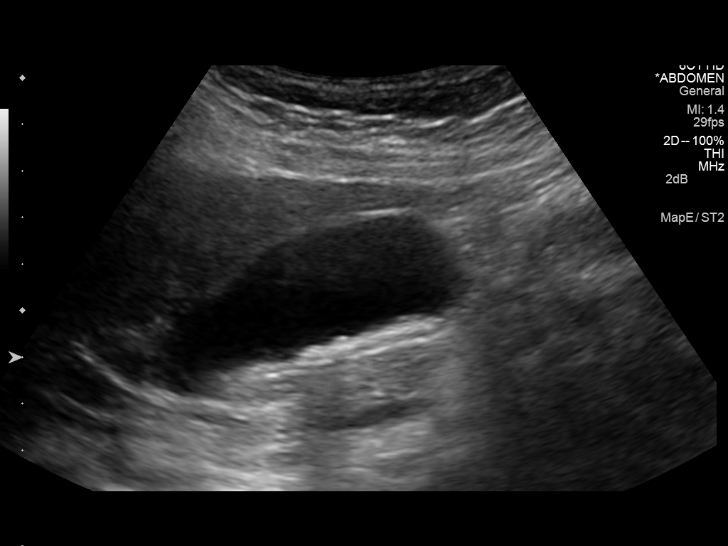
[im 37/45]
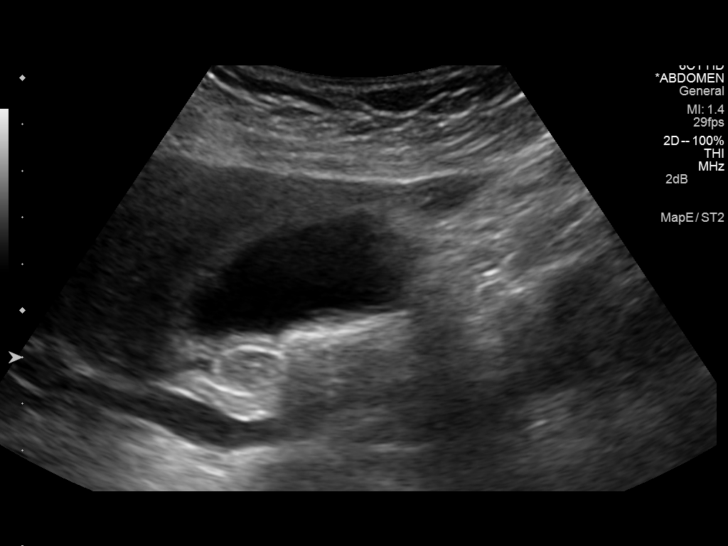
[im 41/45]
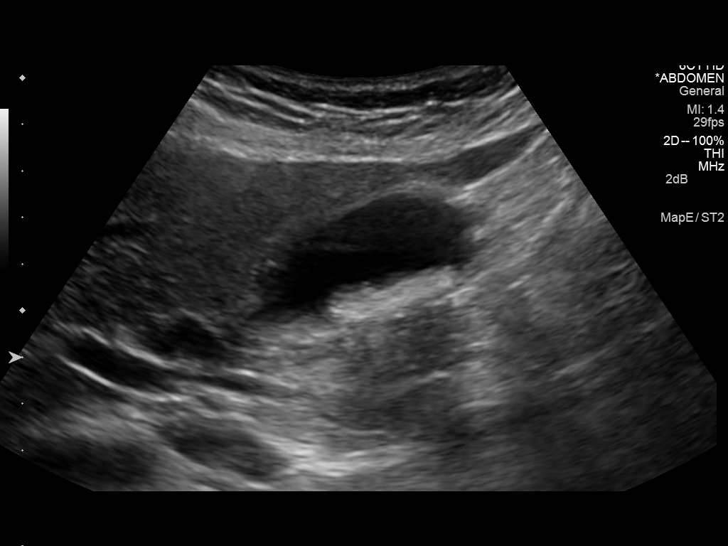
[im 45/45]
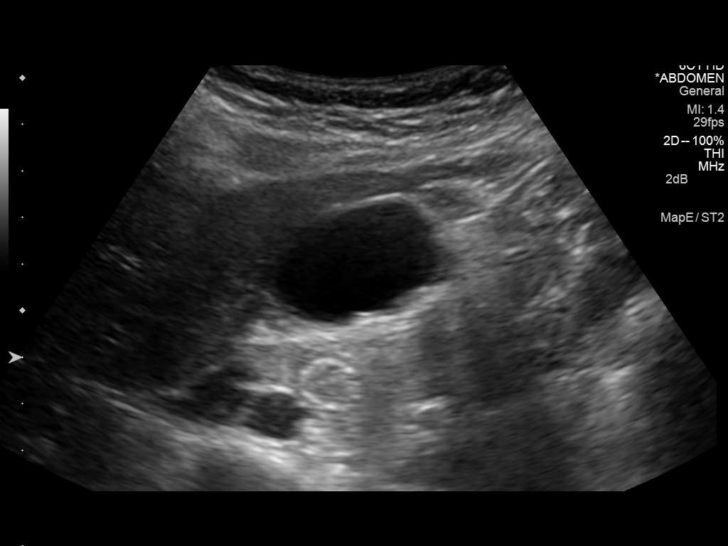

[14 of 25 positions shown; findings below may reference images not displayed]

FINDINGS: Gallbladder:

Tiny dependent shadowing calculi within gallbladder. Mild
gallbladder wall thickening up to 5 mm thick. No sonographic Murphy
sign or pericholecystic fluid

Common bile duct:

Diameter: Normal caliber 4 mm diameter

Liver:

Normal appearance.  No intrahepatic biliary dilatation.

No RIGHT upper quadrant free fluid.
IMPRESSION: Cholelithiasis with mild gallbladder wall thickening, though no
sonographic Murphy sign or pericholecystic fluid are seen to confirm
acute cholecystitis.

If acute cholecystitis remains a clinical concern, recommend
radionuclide hepatobiliary imaging for further evaluation.

## 2015-08-24 IMAGING — CR DG ABDOMEN ACUTE W/ 1V CHEST
3 series · 3 of 3 positions shown · non-contrast
Comparison: None.

CLINICAL DATA: Epigastric pain starting yesterday

EXAM:
ACUTE ABDOMEN SERIES (ABDOMEN 2 VIEW & CHEST 1 VIEW)

[w chest pa]
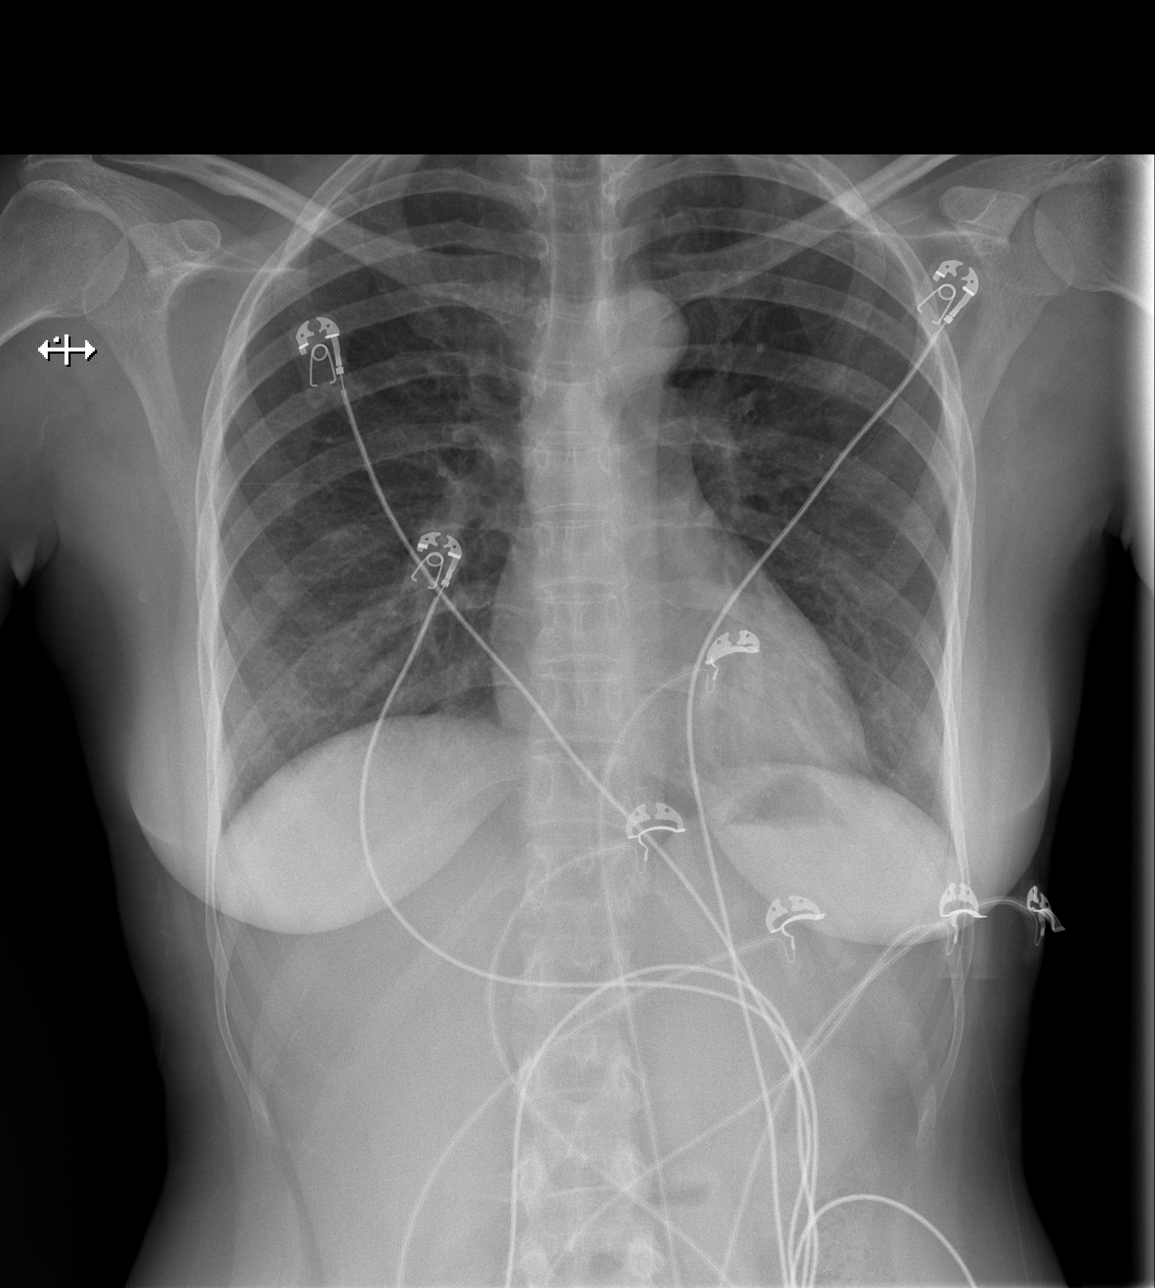

[w abdomen upright]
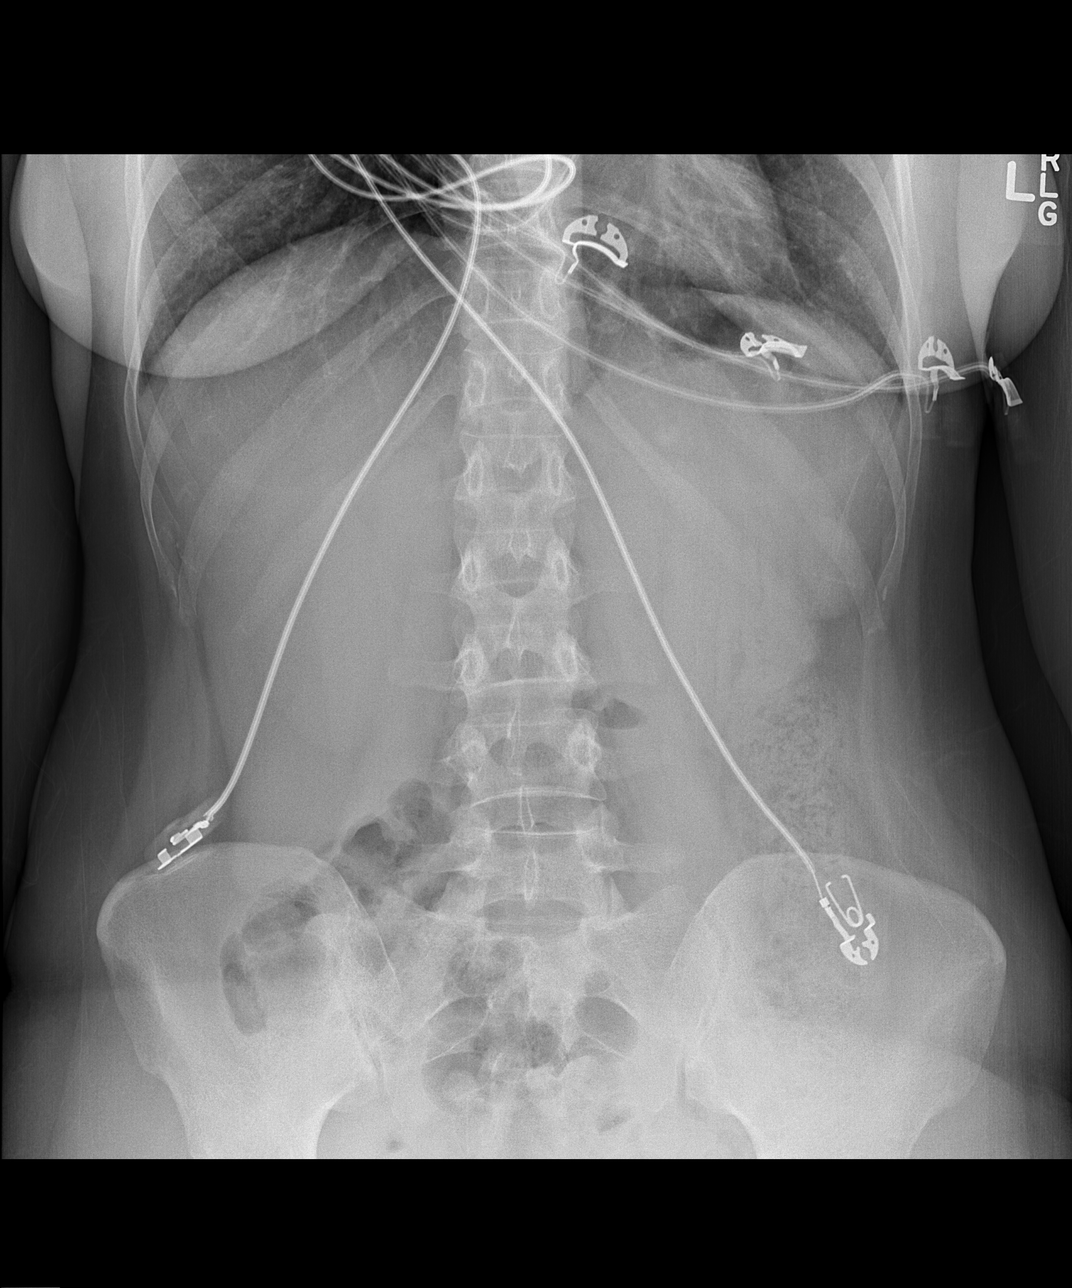

[t abdomen supine]
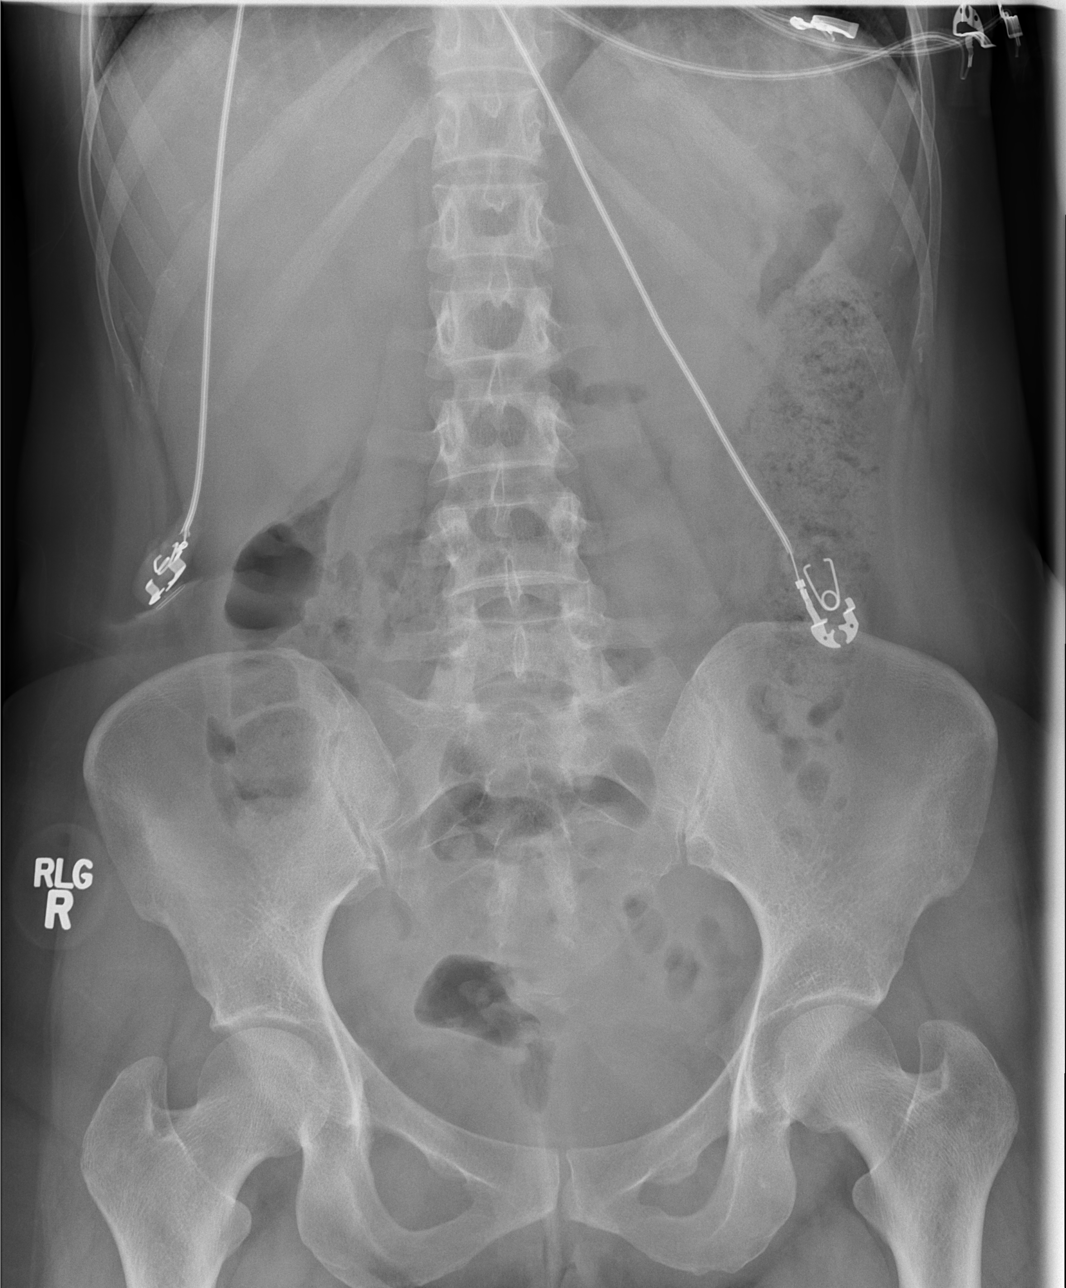

[3 of 3 positions shown; findings below may reference images not displayed]

FINDINGS: Cardiomediastinal silhouette is unremarkable. No acute infiltrate or
pleural effusion. No pulmonary edema. There is nonspecific
nonobstructive bowel gas pattern. No free abdominal air. Moderate
stool in left colon.
IMPRESSION: Negative abdominal radiographs.  No acute cardiopulmonary disease.

## 2016-02-12 ENCOUNTER — Ambulatory Visit (INDEPENDENT_AMBULATORY_CARE_PROVIDER_SITE_OTHER): Payer: 59 | Admitting: Internal Medicine

## 2016-02-12 ENCOUNTER — Encounter: Payer: Self-pay | Admitting: Internal Medicine

## 2016-02-12 ENCOUNTER — Other Ambulatory Visit (INDEPENDENT_AMBULATORY_CARE_PROVIDER_SITE_OTHER): Payer: 59

## 2016-02-12 VITALS — BP 110/80 | HR 79 | Wt 115.0 lb

## 2016-02-12 DIAGNOSIS — K219 Gastro-esophageal reflux disease without esophagitis: Secondary | ICD-10-CM | POA: Diagnosis not present

## 2016-02-12 DIAGNOSIS — L659 Nonscarring hair loss, unspecified: Secondary | ICD-10-CM

## 2016-02-12 DIAGNOSIS — R197 Diarrhea, unspecified: Secondary | ICD-10-CM

## 2016-02-12 DIAGNOSIS — K9189 Other postprocedural complications and disorders of digestive system: Secondary | ICD-10-CM

## 2016-02-12 DIAGNOSIS — M2550 Pain in unspecified joint: Secondary | ICD-10-CM | POA: Insufficient documentation

## 2016-02-12 DIAGNOSIS — Z9049 Acquired absence of other specified parts of digestive tract: Secondary | ICD-10-CM

## 2016-02-12 DIAGNOSIS — K591 Functional diarrhea: Secondary | ICD-10-CM | POA: Diagnosis not present

## 2016-02-12 LAB — BASIC METABOLIC PANEL
BUN: 10 mg/dL (ref 6–23)
CO2: 28 mEq/L (ref 19–32)
Calcium: 9.2 mg/dL (ref 8.4–10.5)
Chloride: 103 mEq/L (ref 96–112)
Creatinine, Ser: 0.49 mg/dL (ref 0.40–1.20)
GFR: 148.57 mL/min (ref >60.00)
GLUCOSE: 91 mg/dL (ref 70–99)
POTASSIUM: 4.1 meq/L (ref 3.5–5.1)
Sodium: 137 mEq/L (ref 135–145)

## 2016-02-12 LAB — CBC WITH DIFFERENTIAL/PLATELET
Basophils Absolute: 0 10*3/uL (ref 0.0–0.1)
Basophils Relative: 0.5 % (ref 0.0–3.0)
EOS ABS: 0 10*3/uL (ref 0.0–0.7)
Eosinophils Relative: 0.2 % (ref 0.0–5.0)
HCT: 37.7 % (ref 36.0–46.0)
HEMOGLOBIN: 12.7 g/dL (ref 12.0–15.0)
Lymphocytes Relative: 36.5 % (ref 12.0–46.0)
Lymphs Abs: 3 10*3/uL (ref 0.7–4.0)
MCHC: 33.8 g/dL (ref 30.0–36.0)
MCV: 89.1 fl (ref 78.0–100.0)
MONOS PCT: 4.8 % (ref 3.0–12.0)
Monocytes Absolute: 0.4 10*3/uL (ref 0.1–1.0)
NEUTROS ABS: 4.8 10*3/uL (ref 1.4–7.7)
Neutrophils Relative %: 58 % (ref 43.0–77.0)
Platelets: 241 10*3/uL (ref 150.0–400.0)
RBC: 4.23 Mil/uL (ref 3.87–5.11)
RDW: 14.3 % (ref 11.5–15.5)
WBC: 8.3 10*3/uL (ref 4.0–10.5)

## 2016-02-12 LAB — URINALYSIS
BILIRUBIN URINE: NEGATIVE
Hgb urine dipstick: NEGATIVE
Ketones, ur: NEGATIVE
Leukocytes, UA: NEGATIVE
NITRITE: NEGATIVE
PH: 6.5 (ref 5.0–8.0)
Specific Gravity, Urine: <=1.005 — AB (ref 1.000–1.030)
TOTAL PROTEIN, URINE-UPE24: NEGATIVE
Urine Glucose: NEGATIVE
Urobilinogen, UA: 0.2 (ref 0.0–1.0)

## 2016-02-12 LAB — TSH: TSH: 1.69 u[IU]/mL (ref 0.35–4.50)

## 2016-02-12 LAB — HEPATIC FUNCTION PANEL
ALBUMIN: 4.3 g/dL (ref 3.5–5.2)
ALT: 7 U/L (ref 0–35)
AST: 13 U/L (ref 0–37)
Alkaline Phosphatase: 58 U/L (ref 39–117)
Bilirubin, Direct: 0 mg/dL (ref 0.0–0.3)
Total Bilirubin: 0.3 mg/dL (ref 0.2–1.2)
Total Protein: 7.6 g/dL (ref 6.0–8.3)

## 2016-02-12 LAB — VITAMIN D 25 HYDROXY (VIT D DEFICIENCY, FRACTURES): VITD: 14.47 ng/mL — AB (ref 30.00–100.00)

## 2016-02-12 LAB — VITAMIN B12: Vitamin B-12: 274 pg/mL (ref 211–911)

## 2016-02-12 LAB — SEDIMENTATION RATE: Sed Rate: 17 mm/hr (ref 0–22)

## 2016-02-12 MED ORDER — ERGOCALCIFEROL 1.25 MG (50000 UT) PO CAPS: 50000.0000 [IU] | ORAL_CAPSULE | ORAL | Status: DC

## 2016-02-12 MED ORDER — VITAMIN D3 50 MCG (2000 UT) PO CAPS: 2000.0000 [IU] | ORAL_CAPSULE | Freq: Every day | ORAL | Status: DC

## 2016-02-12 MED ORDER — PREDNISONE 10 MG PO TABS: ORAL_TABLET | ORAL | Status: DC

## 2016-02-12 MED ORDER — MINOXIDIL 2 % EX SOLN: Freq: Two times a day (BID) | CUTANEOUS | Status: DC

## 2016-02-12 MED ORDER — CHOLESTYRAMINE 4 G PO PACK: 4.0000 g | PACK | Freq: Every day | ORAL | Status: DC

## 2016-02-12 MED ORDER — RANITIDINE HCL 150 MG PO CAPS: 150.0000 mg | ORAL_CAPSULE | Freq: Two times a day (BID) | ORAL | Status: DC

## 2016-02-12 MED ORDER — VITAMIN B-12 500 MCG SL SUBL: SUBLINGUAL_TABLET | SUBLINGUAL | Status: DC

## 2016-02-12 MED ORDER — MELOXICAM 7.5 MG PO TABS: 7.5000 mg | ORAL_TABLET | Freq: Every day | ORAL | Status: DC | PRN

## 2016-02-12 NOTE — Progress Notes (Signed)
Pre visit review using our clinic review tool, if applicable. No additional management support is needed unless otherwise documented below in the visit note. 

## 2016-02-12 NOTE — Progress Notes (Signed)
Subjective:  Patient ID: Gabriela Le, female    DOB: 10-21-1976  Age: 40 y.o. MRN: [id number]  CC: No chief complaint on file.   HPI SHAKETTA RILL presents for diarrhea after GB surgery since 2015 - 3-4 times a day. C/o hair loss. C/o pains in R shoulder, R index finger, L forearm x months   Outpatient Prescriptions Prior to Visit  Medication Sig Dispense Refill  . acetaminophen (TYLENOL) 325 MG tablet Take 2 tablets (650 mg total) by mouth every 6 (six) hours as needed.    . ranitidine (ZANTAC) 150 MG capsule Take 1 capsule (150 mg total) by mouth 2 (two) times daily. (Patient taking differently: Take 150 mg by mouth daily as needed for heartburn. ) 60 capsule 5  . ibuprofen (ADVIL,MOTRIN) 200 MG tablet You can take 2 tablets every 6 hours for pain, as needed. (Patient not taking: Reported on 02/12/2016) 30 tablet   . oxyCODONE-acetaminophen (PERCOCET/ROXICET) 5-325 MG per tablet Take 1-2 tablets by mouth every 4 (four) hours as needed for moderate pain. (Patient not taking: Reported on 02/12/2016) 40 tablet 0   No facility-administered medications prior to visit.    ROS Review of Systems  Constitutional: Positive for fatigue. Negative for chills, activity change, appetite change and unexpected weight change.  HENT: Negative for congestion, mouth sores and sinus pressure.   Eyes: Negative for visual disturbance.  Respiratory: Negative for cough and chest tightness.   Gastrointestinal: Positive for diarrhea. Negative for nausea, vomiting, abdominal pain, blood in stool and abdominal distention.  Genitourinary: Negative for frequency, difficulty urinating and vaginal pain.  Musculoskeletal: Positive for myalgias and arthralgias. Negative for back pain and gait problem.  Skin: Negative for pallor and rash.  Neurological: Negative for dizziness, tremors, weakness, numbness and headaches.  Psychiatric/Behavioral: Negative for confusion, sleep disturbance and decreased concentration.     Objective:  BP 110/80 mmHg  Pulse 79  Wt 115 lb (52.164 kg)  SpO2 99%  BP Readings from Last 3 Encounters:  02/12/16 110/80  11/29/14 110/66  11/20/14 112/78    Wt Readings from Last 3 Encounters:  02/12/16 115 lb (52.164 kg)  11/27/14 130 lb 14.4 oz (59.376 kg)  11/20/14 115 lb (52.164 kg)    Physical Exam  Constitutional: She appears well-developed. No distress.  HENT:  Head: Normocephalic.  Right Ear: External ear normal.  Left Ear: External ear normal.  Nose: Nose normal.  Mouth/Throat: Oropharynx is clear and moist.  Eyes: Conjunctivae are normal. Pupils are equal, round, and reactive to light. Right eye exhibits no discharge. Left eye exhibits no discharge.  Neck: Normal range of motion. Neck supple. No JVD present. No tracheal deviation present. No thyromegaly present.  Cardiovascular: Normal rate, regular rhythm and normal heart sounds.   Pulmonary/Chest: No stridor. No respiratory distress. She has no wheezes.  Abdominal: Soft. Bowel sounds are normal. She exhibits no distension and no mass. There is no tenderness. There is no rebound and no guarding.  Musculoskeletal: She exhibits tenderness. She exhibits no edema.  Lymphadenopathy:    She has no cervical adenopathy.  Neurological: She displays normal reflexes. No cranial nerve deficit. She exhibits normal muscle tone. Coordination normal.  Skin: No rash noted. No erythema. No pallor.  Psychiatric: She has a normal mood and affect. Her behavior is normal. Judgment and thought content normal.  Some hair loss  painful R shoulder, R index finger, L forearm    Lab Results  Component Value Date   WBC 8.3 02/12/2016  HGB 12.7 02/12/2016   HCT 37.7 02/12/2016   PLT 241.0 02/12/2016   GLUCOSE 91 02/12/2016   CHOL 169 04/29/2010   TRIG 163.0* 04/29/2010   HDL 44.10 04/29/2010   LDLCALC 92 04/29/2010   ALT 7 02/12/2016   AST 13 02/12/2016   NA 137 02/12/2016   K 4.1 02/12/2016   CL 103 02/12/2016    CREATININE 0.49 02/12/2016   BUN 10 02/12/2016   CO2 28 02/12/2016   TSH 1.69 02/12/2016    Dg Cholangiogram Operative  11/27/2014  CLINICAL DATA:  Cholelithiasis and cholecystitis. EXAM: INTRAOPERATIVE CHOLANGIOGRAM TECHNIQUE: Cholangiographic images from the C-arm fluoroscopic device were submitted for interpretation post-operatively. Please see the procedural report for the amount of contrast and the fluoroscopy time utilized. COMPARISON:  None. FINDINGS: Injected contrast shows no evidence of biliary dilatation or stones within the common bile duct. No evidence of common duct stricture or obstruction, with prompt emptying of contrast into the duodenum. IMPRESSION: Negative. No evidence of choledocholithiasis or biliary obstruction. Electronically Signed   By: Myles Rosenthal M.D.   On: 11/27/2014 12:11   US Abdomen Limited  11/27/2014  CLINICAL DATA:  Upper abdominal pain 09/11/2013 EXAM: US ABDOMEN LIMITED - RIGHT UPPER QUADRANT COMPARISON:  None. FINDINGS: Gallbladder: Tiny dependent shadowing calculi within gallbladder. Mild gallbladder wall thickening up to 5 mm thick. No sonographic Murphy sign or pericholecystic fluid Common bile duct: Diameter: Normal caliber 4 mm diameter Liver: Normal appearance.  No intrahepatic biliary dilatation. No RIGHT upper quadrant free fluid. IMPRESSION: Cholelithiasis with mild gallbladder wall thickening, though no sonographic Murphy sign or pericholecystic fluid are seen to confirm acute cholecystitis. If acute cholecystitis remains a clinical concern, recommend radionuclide hepatobiliary imaging for further evaluation. Electronically Signed   By: Ulyses Southward M.D.   On: 11/27/2014 08:31   Dg Abd Acute W/chest  11/27/2014  CLINICAL DATA:  Epigastric pain starting yesterday EXAM: ACUTE ABDOMEN SERIES (ABDOMEN 2 VIEW & CHEST 1 VIEW) COMPARISON:  None. FINDINGS: Cardiomediastinal silhouette is unremarkable. No acute infiltrate or pleural effusion. No pulmonary edema.  There is nonspecific nonobstructive bowel gas pattern. No free abdominal air. Moderate stool in left colon. IMPRESSION: Negative abdominal radiographs.  No acute cardiopulmonary disease. Electronically Signed   By: Natasha Mead M.D.   On: 11/27/2014 08:31    Assessment & Plan:   Diagnoses and all orders for this visit:  Functional diarrhea -     ANA; Future -     CBC with Differential/Platelet; Future -     Basic metabolic panel; Future -     Hepatic function panel; Future -     TSH; Future -     Rheumatoid factor; Future -     Sedimentation rate; Future -     Vitamin B12; Future -     VITAMIN D 25 Hydroxy (Vit-D Deficiency, Fractures); Future -     Urinalysis; Future  Gastroesophageal reflux disease without esophagitis -     ANA; Future -     CBC with Differential/Platelet; Future -     Basic metabolic panel; Future -     Hepatic function panel; Future -     TSH; Future -     Rheumatoid factor; Future -     Sedimentation rate; Future -     Vitamin B12; Future -     VITAMIN D 25 Hydroxy (Vit-D Deficiency, Fractures); Future -     Urinalysis; Future  Postcholecystectomy diarrhea -     ANA; Future -  CBC with Differential/Platelet; Future -     Basic metabolic panel; Future -     Hepatic function panel; Future -     TSH; Future -     Rheumatoid factor; Future -     Sedimentation rate; Future -     Vitamin B12; Future -     VITAMIN D 25 Hydroxy (Vit-D Deficiency, Fractures); Future -     Urinalysis; Future  Alopecia -     ANA; Future -     CBC with Differential/Platelet; Future -     Basic metabolic panel; Future -     Hepatic function panel; Future -     TSH; Future -     Rheumatoid factor; Future -     Sedimentation rate; Future -     Vitamin B12; Future -     VITAMIN D 25 Hydroxy (Vit-D Deficiency, Fractures); Future -     Urinalysis; Future  Arthralgia -     ANA; Future -     CBC with Differential/Platelet; Future -     Basic metabolic panel; Future -      Hepatic function panel; Future -     TSH; Future -     Rheumatoid factor; Future -     Sedimentation rate; Future -     Vitamin B12; Future -     VITAMIN D 25 Hydroxy (Vit-D Deficiency, Fractures); Future -     Urinalysis; Future  Other orders -     ranitidine (ZANTAC) 150 MG capsule; Take 1 capsule (150 mg total) by mouth 2 (two) times daily. -     meloxicam (MOBIC) 7.5 MG tablet; Take 1 tablet (7.5 mg total) by mouth daily as needed for pain. -     minoxidil (ROGAINE) 2 % external solution; Apply topically 2 (two) times daily. On scalp as directed -     cholestyramine (QUESTRAN) 4 g packet; Take 1 packet (4 g total) by mouth daily. -     predniSONE (DELTASONE) 10 MG tablet; Prednisone 10 mg: take 4 tabs a day x 3 days; then 3 tabs a day x 4 days; then 2 tabs a day x 4 days, then 1 tab a day x 6 days, then stop. Take pc.   I have discontinued Ms. Bhardwaj's oxyCODONE-acetaminophen and ibuprofen. I am also having her start on meloxicam, minoxidil, cholestyramine, and predniSONE. Additionally, I am having her maintain her acetaminophen and ranitidine.  Meds ordered this encounter  Medications  . ranitidine (ZANTAC) 150 MG capsule    Sig: Take 1 capsule (150 mg total) by mouth 2 (two) times daily.    Dispense:  60 capsule    Refill:  11  . meloxicam (MOBIC) 7.5 MG tablet    Sig: Take 1 tablet (7.5 mg total) by mouth daily as needed for pain.    Dispense:  30 tablet    Refill:  3  . minoxidil (ROGAINE) 2 % external solution    Sig: Apply topically 2 (two) times daily. On scalp as directed    Dispense:  60 mL    Refill:  0  . cholestyramine (QUESTRAN) 4 g packet    Sig: Take 1 packet (4 g total) by mouth daily.    Dispense:  30 each    Refill:  11  . predniSONE (DELTASONE) 10 MG tablet    Sig: Prednisone 10 mg: take 4 tabs a day x 3 days; then 3 tabs a day x 4 days; then 2 tabs a day  x 4 days, then 1 tab a day x 6 days, then stop. Take pc.    Dispense:  38 tablet    Refill:  1      Follow-up: Return in about 6 weeks (around 03/25/2016) for a follow-up visit.  Sonda Primes, MD

## 2016-02-12 NOTE — Assessment & Plan Note (Signed)
Ranitidine bid 150 mg

## 2016-02-12 NOTE — Assessment & Plan Note (Addendum)
2017 r/o inflam condition Labs Meloxicam prn

## 2016-02-12 NOTE — Assessment & Plan Note (Signed)
Labs incl ANA, RF Rogain for women

## 2016-02-13 LAB — RHEUMATOID FACTOR

## 2016-02-13 LAB — ANA: Anti Nuclear Antibody(ANA): NEGATIVE

## 2016-07-17 ENCOUNTER — Ambulatory Visit (INDEPENDENT_AMBULATORY_CARE_PROVIDER_SITE_OTHER): Payer: 59 | Admitting: Family

## 2016-07-17 ENCOUNTER — Encounter: Payer: Self-pay | Admitting: Family

## 2016-07-17 ENCOUNTER — Ambulatory Visit (INDEPENDENT_AMBULATORY_CARE_PROVIDER_SITE_OTHER)
Admission: RE | Admit: 2016-07-17 | Discharge: 2016-07-17 | Disposition: A | Discharge status: Home / Self Care | Payer: 59 | Source: Ambulatory Visit | Attending: Family | Admitting: Family

## 2016-07-17 VITALS — BP 128/72 | HR 81 | Temp 98.7°F | Ht <= 58 in | Wt 113.0 lb

## 2016-07-17 DIAGNOSIS — M25511 Pain in right shoulder: Secondary | ICD-10-CM | POA: Insufficient documentation

## 2016-07-17 DIAGNOSIS — S93401A Sprain of unspecified ligament of right ankle, initial encounter: Secondary | ICD-10-CM | POA: Insufficient documentation

## 2016-07-17 MED ORDER — NAPROXEN-ESOMEPRAZOLE 500-20 MG PO TBEC
1.0000 | DELAYED_RELEASE_TABLET | Freq: Two times a day (BID) | ORAL | Status: DC | PRN
Start: 2016-07-17 — End: 2016-08-05

## 2016-07-17 MED ORDER — DICLOFENAC SODIUM 2 % TD SOLN: 1.0000 "application " | Freq: Two times a day (BID) | TRANSDERMAL | Status: DC | PRN

## 2016-07-17 NOTE — Assessment & Plan Note (Signed)
Symptoms and exam consistent with mild/moderate lateral ankle sprain. Continue with conservative treatment with ice, home exercise therapy, and compression. Encouraged to wear a supportive shoe. Start Pennsaid and Vimovo. Consider lace up ankle brace as needed.

## 2016-07-17 NOTE — Progress Notes (Signed)
Subjective:    Patient ID: Gabriela Le, female    DOB: 1976-12-24, 40 y.o.   MRN: [id number]  Chief Complaint  Patient presents with  . Shoulder Pain    Pt has pain in the right shoulder. Pain has been inconsistent for the last 2 years. Pt states that it has recently becoming more persistent. No recent injury to cause pain to become so persistent.   . Foot Pain    Pt recently twisted ankle a week ago and the top of her foot is still in pain.     HPI:  Gabriela Le is a 40 y.o. 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 and presents today for an office visit.   1.) Shoulder pain - This is a chronic problem that has worsened over the past 3 weeks. She is right hand dominant. Pain is described as sharp and aggravated with pushing and pulling as well as restricted range of motion. Denies any specific trauma. Modifying factors include meloxicam which does not help with the pain. Denies neck pain, numbness or tingling. No previous shoulder injury.    2.) Ankle pain  -  This is a new problem. Associated symptom of pain located on the lateral aspect of her right ankle has been going on for about a week following stepping on a hose and inverted her ankle. No sounds/sensations heard or felt. Modifying factors include olive oil which has helped a little. Course of the symptoms have improved since initial onset.    No Known Allergies   Current Outpatient Prescriptions on File Prior to Visit  Medication Sig Dispense Refill  . ranitidine (ZANTAC) 150 MG capsule Take 1 capsule (150 mg total) by mouth 2 (two) times daily. 60 capsule 11   No current facility-administered medications on file prior to visit.     Past Surgical History  Procedure Laterality Date  . Appendectomy      6 YOA  . Cesarean section  01/06/2013   Procedure: CESAREAN SECTION;  Surgeon: Hal Morales, MD;  Location: [facility];  Service: Obstetrics;  Laterality: N/A;  Primary  . Cesarean section N/A 10/09/2014    Procedure: CESAREAN SECTION;  Surgeon: Konrad Felix, MD;  Location: [facility];  Service: Obstetrics;  Laterality: N/A;  . Cholecystectomy N/A 11/27/2014    Procedure: LAPAROSCOPIC CHOLECYSTECTOMY WITH INTRAOPERATIVE CHOLANGIOGRAM;  Surgeon: Chevis Pretty III, MD;  Location: [facility];  Service: General;  Laterality: N/A;      Past Medical History  Diagnosis Date  . Irregular menses   . 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 H/O cold sores     ACYCLOVIR OINTMENT PRN  . Infertility associated with anovulation     HAS TAKEN CLOMID IN THE PAST  . GERD (gastroesophageal reflux disease)   . Missed ab 12/29/2010    no surgery required    Review of Systems  Constitutional: Negative for fever and chills.  Musculoskeletal:       Positive for ankle and shoulder pain.  Neurological: Positive for weakness. Negative for numbness.      Objective:    BP 128/72 mmHg  Pulse 81  Temp(Src) 98.7 F (37.1 C) (Oral)  Ht  (1.422 m)  Wt 113 lb (51.256 kg)  BMI 25.35 kg/m2  SpO2 98% Nursing note and vital signs reviewed.  Physical Exam  Constitutional: She is oriented to  person, place, and time. She appears well-developed and well-nourished. No distress.  Cardiovascular: Normal rate, regular rhythm, normal heart sounds and intact distal pulses.   Pulmonary/Chest: Effort normal and breath sounds normal.  Musculoskeletal:  Right shoulder - no obvious deformity, discoloration, or edema. Palpable tenderness is generalized around the biceps tendon, subacromial space, and rhomboids/scapula. Range of motion is restricted to approximately 120 of flexion and abduction. There is decreased strength secondary to tenderness. Distal pulses and sensation are intact and appropriate. Positive empty can; positive Neer's impingement; positive apprehension.  Right  ankle - no obvious deformity or discoloration noted. Mild/moderate edema around bilateral ankle and anterior talofibular ligament with tenderness. Range of motion is intact and appropriate. Positive anterior drawer and negative talar tilt and Kleiger's. Pulses and sensation are intact and appropriate.   Neurological: She is alert and oriented to person, place, and time.  Skin: Skin is warm and dry.  Psychiatric: She has a normal mood and affect. Her behavior is normal. Judgment and thought content normal.       Assessment & Plan:   Problem List Items Addressed This Visit      Musculoskeletal and Integument   Right ankle sprain - Primary    Symptoms and exam consistent with mild/moderate lateral ankle sprain. Continue with conservative treatment with ice, home exercise therapy, and compression. Encouraged to wear a supportive shoe. Start Pennsaid and Vimovo. Consider lace up ankle brace as needed.       Relevant Medications   Diclofenac Sodium (PENNSAID) 2 % SOLN   Naproxen-Esomeprazole 500-20 MG TBEC     Other   Right shoulder pain    Right shoulder pain with diffuse and generalized tenderness with differentials including biceps tendinitis, rotator cuff pathology and possible scapular dysfunction. Also given positive apprehension cannot rule out subluxation. Decrease inflammation with ice, Vimovo and Pennsaid. Obtain x-rays to rule out structural abnormality. Home exercise therapy initiated. Follow up in 2 weeks or sooner if needed.       Relevant Medications   Diclofenac Sodium (PENNSAID) 2 % SOLN   Naproxen-Esomeprazole 500-20 MG TBEC   Other Relevant Orders   DG Shoulder Right       I have discontinued Ms. Predmore's acetaminophen, meloxicam, minoxidil, cholestyramine, predniSONE, Vitamin B-12, ergocalciferol, and Vitamin D3. I am also having her start on Diclofenac Sodium and Naproxen-Esomeprazole. Additionally, I am having her maintain her ranitidine.   Meds ordered this  encounter  Medications  . Diclofenac Sodium (PENNSAID) 2 % SOLN    Sig: Place 1 application onto the skin 2 (two) times daily as needed.    Dispense:  112 g    Refill:  1    Order Specific Question:  Supervising Provider    Answer:  Hillard DankerRAWFORD, ELIZABETH A [[id number]]  . Naproxen-Esomeprazole 500-20 MG TBEC    Sig: Take 1 tablet by mouth 2 (two) times daily as needed.    Dispense:  60 tablet    Refill:  0    Order Specific Question:  Supervising Provider    Answer:  Hillard DankerRAWFORD, ELIZABETH A [[id number]]     Follow-up: Return in about 2 weeks (around 07/31/2016), or if symptoms worsen or fail to improve.  Jeanine Luzalone, Gregory, FNP

## 2016-07-17 NOTE — Progress Notes (Signed)
Pre visit review using our clinic review tool, if applicable. No additional management support is needed unless otherwise documented below in the visit note. 

## 2016-07-17 NOTE — Assessment & Plan Note (Addendum)
Right shoulder pain with diffuse and generalized tenderness with differentials including biceps tendinitis, rotator cuff pathology and possible scapular dysfunction. Also given positive apprehension cannot rule out subluxation. Decrease inflammation with ice, Vimovo and Pennsaid. Obtain x-rays to rule out structural abnormality. Home exercise therapy initiated. Follow up in 2 weeks or sooner if needed.

## 2016-07-17 NOTE — Patient Instructions (Signed)
Thank you for choosing [facility].  Summary/Instructions:  Ice your shoulder and knee 20 minutes every 2 hours as needed and after activity.  Wear a good supportive shoe.  Exercises 2x per day.  Pennsaid - 2x per day to the affected area  Vimovo - 2x per day for the next 7 days and then as needed.   Your prescription(s) have been submitted to your pharmacy or been printed and provided for you. Please take as directed and contact our office if you believe you are having problem(s) with the medication(s) or have any questions.  If your symptoms worsen or fail to improve, please contact our office for further instruction, or in case of emergency go directly to the emergency room at the closest medical facility.   Acute Ankle Sprain With Phase I Rehab An acute ankle sprain is a partial or complete tear in one or more of the ligaments of the ankle due to traumatic injury. The severity of the injury depends on both the number of ligaments sprained and the grade of sprain. There are 3 grades of sprains.   A grade 1 sprain is a mild sprain. There is a slight pull without obvious tearing. There is no loss of strength, and the muscle and ligament are the correct length.  A grade 2 sprain is a moderate sprain. There is tearing of fibers within the substance of the ligament where it connects two bones or two cartilages. The length of the ligament is increased, and there is usually decreased strength.  A grade 3 sprain is a complete rupture of the ligament and is uncommon. In addition to the grade of sprain, there are three types of ankle sprains.  Lateral ankle sprains: This is a sprain of one or more of the three ligaments on the outer side (lateral) of the ankle. These are the most common sprains. Medial ankle sprains: There is one large triangular ligament of the inner side (medial) of the ankle that is susceptible to injury. Medial ankle sprains are less common. Syndesmosis, "high  ankle," sprains: The syndesmosis is the ligament that connects the two bones of the lower leg. Syndesmosis sprains usually only occur with very severe ankle sprains. SYMPTOMS  Pain, tenderness, and swelling in the ankle, starting at the side of injury that may progress to the whole ankle and foot with time.  "Pop" or tearing sensation at the time of injury.  Bruising that may spread to the heel.  Impaired ability to walk soon after injury. CAUSES   Acute ankle sprains are caused by trauma placed on the ankle that temporarily forces or pries the anklebone (talus) out of its normal socket.  Stretching or tearing of the ligaments that normally hold the joint in place (usually due to a twisting injury). RISK INCREASES WITH:  Previous ankle sprain.  Sports in which the foot may land awkwardly (i.e., basketball, volleyball, or soccer) or walking or running on uneven or rough surfaces.  Shoes with inadequate support to prevent sideways motion when stress occurs.  Poor strength and flexibility.  Poor balance skills.  Contact sports. PREVENTION   Warm up and stretch properly before activity.  Maintain physical fitness:  Ankle and leg flexibility, muscle strength, and endurance.  Cardiovascular fitness.  Balance training activities.  Use proper technique and have a coach correct improper technique.  Taping, protective strapping, bracing, or high-top tennis shoes may help prevent injury. Initially, tape is best; however, it loses most of its support function within 10 to  15 minutes.  Wear proper-fitted protective shoes (High-top shoes with taping or bracing is more effective than either alone).  Provide the ankle with support during sports and practice activities for 12 months following injury. PROGNOSIS   If treated properly, ankle sprains can be expected to recover completely; however, the length of recovery depends on the degree of injury.  A grade 1 sprain usually heals  enough in 5 to 7 days to allow modified activity and requires an average of 6 weeks to heal completely.  A grade 2 sprain requires 6 to 10 weeks to heal completely.  A grade 3 sprain requires 12 to 16 weeks to heal.  A syndesmosis sprain often takes more than 3 months to heal. RELATED COMPLICATIONS   Frequent recurrence of symptoms may result in a chronic problem. Appropriately addressing the problem the first time decreases the frequency of recurrence and optimizes healing time. Severity of the initial sprain does not predict the likelihood of later instability.  Injury to other structures (bone, cartilage, or tendon).  A chronically unstable or arthritic ankle joint is a possibility with repeated sprains. TREATMENT Treatment initially involves the use of ice, medication, and compression bandages to help reduce pain and inflammation. Ankle sprains are usually immobilized in a walking cast or boot to allow for healing. Crutches may be recommended to reduce pressure on the injury. After immobilization, strengthening and stretching exercises may be necessary to regain strength and a full range of motion. Surgery is rarely needed to treat ankle sprains. MEDICATION   Nonsteroidal anti-inflammatory medications, such as aspirin and ibuprofen (do not take for the first 3 days after injury or within 7 days before surgery), or other minor pain relievers, such as acetaminophen, are often recommended. Take these as directed by your caregiver. Contact your caregiver immediately if any bleeding, stomach upset, or signs of an allergic reaction occur from these medications.  Ointments applied to the skin may be helpful.  Pain relievers may be prescribed as necessary by your caregiver. Do not take prescription pain medication for longer than 4 to 7 days. Use only as directed and only as much as you need. HEAT AND COLD  Cold treatment (icing) is used to relieve pain and reduce inflammation for acute and  chronic cases. Cold should be applied for 10 to 15 minutes every 2 to 3 hours for inflammation and pain and immediately after any activity that aggravates your symptoms. Use ice packs or an ice massage.  Heat treatment may be used before performing stretching and strengthening activities prescribed by your caregiver. Use a heat pack or a warm soak. SEEK IMMEDIATE MEDICAL CARE IF:   Pain, swelling, or bruising worsens despite treatment.  You experience pain, numbness, discoloration, or coldness in the foot or toes.  New, unexplained symptoms develop (drugs used in treatment may produce side effects.) EXERCISES  PHASE I EXERCISES RANGE OF MOTION (ROM) AND STRETCHING EXERCISES - Ankle Sprain, Acute Phase I, Weeks 1 to 2 These exercises may help you when beginning to restore flexibility in your ankle. You will likely work on these exercises for the 1 to 2 weeks after your injury. Once your physician, physical therapist, or athletic trainer sees adequate progress, he or she will advance your exercises. While completing these exercises, remember:   Restoring tissue flexibility helps normal motion to return to the joints. This allows healthier, less painful movement and activity.  An effective stretch should be held for at least 30 seconds.  A stretch should never be  painful. You should only feel a gentle lengthening or release in the stretched tissue. RANGE OF MOTION - Dorsi/Plantar Flexion  While sitting with your right / left knee straight, draw the top of your foot upwards by flexing your ankle. Then reverse the motion, pointing your toes downward.  Hold each position for __________ seconds.  After completing your first set of exercises, repeat this exercise with your knee bent. Repeat __________ times. Complete this exercise __________ times per day.  RANGE OF MOTION - Ankle Alphabet  Imagine your right / left big toe is a pen.  Keeping your hip and knee still, write out the entire  alphabet with your "pen." Make the letters as large as you can without increasing any discomfort. Repeat __________ times. Complete this exercise __________ times per day.  STRENGTHENING EXERCISES - Ankle Sprain, Acute -Phase I, Weeks 1 to 2 These exercises may help you when beginning to restore strength in your ankle. You will likely work on these exercises for 1 to 2 weeks after your injury. Once your physician, physical therapist, or athletic trainer sees adequate progress, he or she will advance your exercises. While completing these exercises, remember:   Muscles can gain both the endurance and the strength needed for everyday activities through controlled exercises.  Complete these exercises as instructed by your physician, physical therapist, or athletic trainer. Progress the resistance and repetitions only as guided.  You may experience muscle soreness or fatigue, but the pain or discomfort you are trying to eliminate should never worsen during these exercises. If this pain does worsen, stop and make certain you are following the directions exactly. If the pain is still present after adjustments, discontinue the exercise until you can discuss the trouble with your clinician. STRENGTH - Dorsiflexors  Secure a rubber exercise band/tubing to a fixed object (i.e., table, pole) and loop the other end around your right / left foot.  Sit on the floor facing the fixed object. The band/tubing should be slightly tense when your foot is relaxed.  Slowly draw your foot back toward you using your ankle and toes.  Hold this position for __________ seconds. Slowly release the tension in the band and return your foot to the starting position. Repeat __________ times. Complete this exercise __________ times per day.  STRENGTH - Plantar-flexors   Sit with your right / left leg extended. Holding onto both ends of a rubber exercise band/tubing, loop it around the ball of your foot. Keep a slight tension in  the band.  Slowly push your toes away from you, pointing them downward.  Hold this position for __________ seconds. Return slowly, controlling the tension in the band/tubing. Repeat __________ times. Complete this exercise __________ times per day.  STRENGTH - Ankle Eversion  Secure one end of a rubber exercise band/tubing to a fixed object (table, pole). Loop the other end around your foot just before your toes.  Place your fists between your knees. This will focus your strengthening at your ankle.  Drawing the band/tubing across your opposite foot, slowly, pull your little toe out and up. Make sure the band/tubing is positioned to resist the entire motion.  Hold this position for __________ seconds. Have your muscles resist the band/tubing as it slowly pulls your foot back to the starting position.  Repeat __________ times. Complete this exercise __________ times per day.  STRENGTH - Ankle Inversion  Secure one end of a rubber exercise band/tubing to a fixed object (table, pole). Loop the other end around  your foot just before your toes.  Place your fists between your knees. This will focus your strengthening at your ankle.  Slowly, pull your big toe up and in, making sure the band/tubing is positioned to resist the entire motion.  Hold this position for __________ seconds.  Have your muscles resist the band/tubing as it slowly pulls your foot back to the starting position. Repeat __________ times. Complete this exercises __________ times per day.  STRENGTH - Towel Curls  Sit in a chair positioned on a non-carpeted surface.  Place your right / left foot on a towel, keeping your heel on the floor.  Pull the towel toward your heel by only curling your toes. Keep your heel on the floor.  If instructed by your physician, physical therapist, or athletic trainer, add weight to the end of the towel. Repeat __________ times. Complete this exercise __________ times per day.   This  information is not intended to replace advice given to you by your health care provider. Make sure you discuss any questions you have with your health care provider.   Document Released: 07/15/2005 Document Revised: 01/04/2015 Document Reviewed: 03/28/2009 [employer] Interactive Patient Education 2016 [employer]  Impingement Syndrome, Rotator Cuff, Bursitis With Rehab Impingement syndrome is a condition that involves inflammation of the tendons of the rotator cuff and the subacromial bursa, that causes pain in the shoulder. The rotator cuff consists of four tendons and muscles that control much of the shoulder and upper arm function. The subacromial bursa is a fluid filled sac that helps reduce friction between the rotator cuff and one of the bones of the shoulder (acromion). Impingement syndrome is usually an overuse injury that causes swelling of the bursa (bursitis), swelling of the tendon (tendonitis), and/or a tear of the tendon (strain). Strains are classified into three categories. Grade 1 strains cause pain, but the tendon is not lengthened. Grade 2 strains include a lengthened ligament, due to the ligament being stretched or partially ruptured. With grade 2 strains there is still function, although the function may be decreased. Grade 3 strains include a complete tear of the tendon or muscle, and function is usually impaired. SYMPTOMS   Pain around the shoulder, often at the outer portion of the upper arm.  Pain that gets worse with shoulder function, especially when reaching overhead or lifting.  Sometimes, aching when not using the arm.  Pain that wakes you up at night.  Sometimes, tenderness, swelling, warmth, or redness over the affected area.  Loss of strength.  Limited motion of the shoulder, especially reaching behind the back (to the back pocket or to unhook bra) or across your body.  Crackling sound (crepitation) when moving the arm.  Biceps tendon pain and inflammation  (in the front of the shoulder). Worse when bending the elbow or lifting. CAUSES  Impingement syndrome is often an overuse injury, in which chronic (repetitive) motions cause the tendons or bursa to become inflamed. A strain occurs when a force is paced on the tendon or muscle that is greater than it can withstand. Common mechanisms of injury include: Stress from sudden increase in duration, frequency, or intensity of training.  Direct hit (trauma) to the shoulder.  Aging, erosion of the tendon with normal use.  Bony bump on shoulder (acromial spur). RISK INCREASES WITH:  Contact sports (football, wrestling, boxing).  Throwing sports (baseball, tennis, volleyball).  Weightlifting and bodybuilding.  Heavy labor.  Previous injury to the rotator cuff, including impingement.  Poor shoulder strength and  flexibility.  Failure to warm up properly before activity.  Inadequate protective equipment.  Old age.  Bony bump on shoulder (acromial spur). PREVENTION   Warm up and stretch properly before activity.  Allow for adequate recovery between workouts.  Maintain physical fitness:  Strength, flexibility, and endurance.  Cardiovascular fitness.  Learn and use proper exercise technique. PROGNOSIS  If treated properly, impingement syndrome usually goes away within 6 weeks. Sometimes surgery is required.  RELATED COMPLICATIONS   Longer healing time if not properly treated, or if not given enough time to heal.  Recurring symptoms, that result in a chronic condition.  Shoulder stiffness, frozen shoulder, or loss of motion.  Rotator cuff tendon tear.  Recurring symptoms, especially if activity is resumed too soon, with overuse, with a direct blow, or when using poor technique. TREATMENT  Treatment first involves the use of ice and medicine, to reduce pain and inflammation. The use of strengthening and stretching exercises may help reduce pain with activity. These exercises may  be performed at home or with a therapist. If non-surgical treatment is unsuccessful after more than 6 months, surgery may be advised. After surgery and rehabilitation, activity is usually possible in 3 months.  MEDICATION  If pain medicine is needed, nonsteroidal anti-inflammatory medicines (aspirin and ibuprofen), or other minor pain relievers (acetaminophen), are often advised.  Do not take pain medicine for 7 days before surgery.  Prescription pain relievers may be given, if your caregiver thinks they are needed. Use only as directed and only as much as you need.  Corticosteroid injections may be given by your caregiver. These injections should be reserved for the most serious cases, because they may only be given a certain number of times. HEAT AND COLD  Cold treatment (icing) should be applied for 10 to 15 minutes every 2 to 3 hours for inflammation and pain, and immediately after activity that aggravates your symptoms. Use ice packs or an ice massage.  Heat treatment may be used before performing stretching and strengthening activities prescribed by your caregiver, physical therapist, or athletic trainer. Use a heat pack or a warm water soak. SEEK MEDICAL CARE IF:   Symptoms get worse or do not improve in 4 to 6 weeks, despite treatment.  New, unexplained symptoms develop. (Drugs used in treatment may produce side effects.) EXERCISES  RANGE OF MOTION (ROM) AND STRETCHING EXERCISES - Impingement Syndrome (Rotator Cuff  Tendinitis, Bursitis) These exercises may help you when beginning to rehabilitate your injury. Your symptoms may go away with or without further involvement from your physician, physical therapist or athletic trainer. While completing these exercises, remember:   Restoring tissue flexibility helps normal motion to return to the joints. This allows healthier, less painful movement and activity.  An effective stretch should be held for at least 30 seconds.  A stretch  should never be painful. You should only feel a gentle lengthening or release in the stretched tissue. STRETCH - Flexion, Standing  Stand with good posture. With an underhand grip on your right / left hand, and an overhand grip on the opposite hand, grasp a broomstick or cane so that your hands are a little more than shoulder width apart.  Keeping your right / left elbow straight and shoulder muscles relaxed, push the stick with your opposite hand, to raise your right / left arm in front of your body and then overhead. Raise your arm until you feel a stretch in your right / left shoulder, but before you have increased shoulder  pain.  Try to avoid shrugging your right / left shoulder as your arm rises, by keeping your shoulder blade tucked down and toward your mid-back spine. Hold for __________ seconds.  Slowly return to the starting position. Repeat __________ times. Complete this exercise __________ times per day. STRETCH - Abduction, Supine  Lie on your back. With an underhand grip on your right / left hand and an overhand grip on the opposite hand, grasp a broomstick or cane so that your hands are a little more than shoulder width apart.  Keeping your right / left elbow straight and your shoulder muscles relaxed, push the stick with your opposite hand, to raise your right / left arm out to the side of your body and then overhead. Raise your arm until you feel a stretch in your right / left shoulder, but before you have increased shoulder pain.  Try to avoid shrugging your right / left shoulder as your arm rises, by keeping your shoulder blade tucked down and toward your mid-back spine. Hold for __________ seconds.  Slowly return to the starting position. Repeat __________ times. Complete this exercise __________ times per day. ROM - Flexion, Active-Assisted  Lie on your back. You may bend your knees for comfort.  Grasp a broomstick or cane so your hands are about shoulder width apart.  Your right / left hand should grip the end of the stick, so that your hand is positioned "thumbs-up," as if you were about to shake hands.  Using your healthy arm to lead, raise your right / left arm overhead, until you feel a gentle stretch in your shoulder. Hold for __________ seconds.  Use the stick to assist in returning your right / left arm to its starting position. Repeat __________ times. Complete this exercise __________ times per day.  ROM - Internal Rotation, Supine   Lie on your back on a firm surface. Place your right / left elbow about 60 degrees away from your side. Elevate your elbow with a folded towel, so that the elbow and shoulder are the same height.  Using a broomstick or cane and your strong arm, pull your right / left hand toward your body until you feel a gentle stretch, but no increase in your shoulder pain. Keep your shoulder and elbow in place throughout the exercise.  Hold for __________ seconds. Slowly return to the starting position. Repeat __________ times. Complete this exercise __________ times per day. STRETCH - Internal Rotation  Place your right / left hand behind your back, palm up.  Throw a towel or belt over your opposite shoulder. Grasp the towel with your right / left hand.  While keeping an upright posture, gently pull up on the towel, until you feel a stretch in the front of your right / left shoulder.  Avoid shrugging your right / left shoulder as your arm rises, by keeping your shoulder blade tucked down and toward your mid-back spine.  Hold for __________ seconds. Release the stretch, by lowering your healthy hand. Repeat __________ times. Complete this exercise __________ times per day. ROM - Internal Rotation   Using an underhand grip, grasp a stick behind your back with both hands.  While standing upright with good posture, slide the stick up your back until you feel a mild stretch in the front of your shoulder.  Hold for __________  seconds. Slowly return to your starting position. Repeat __________ times. Complete this exercise __________ times per day.  STRETCH - Posterior Shoulder Capsule  Stand or sit with good posture. Grasp your right / left elbow and draw it across your chest, keeping it at the same height as your shoulder.  Pull your elbow, so your upper arm comes in closer to your chest. Pull until you feel a gentle stretch in the back of your shoulder.  Hold for __________ seconds. Repeat __________ times. Complete this exercise __________ times per day. STRENGTHENING EXERCISES - Impingement Syndrome (Rotator Cuff Tendinitis, Bursitis) These exercises may help you when beginning to rehabilitate your injury. They may resolve your symptoms with or without further involvement from your physician, physical therapist or athletic trainer. While completing these exercises, remember:  Muscles can gain both the endurance and the strength needed for everyday activities through controlled exercises.  Complete these exercises as instructed by your physician, physical therapist or athletic trainer. Increase the resistance and repetitions only as guided.  You may experience muscle soreness or fatigue, but the pain or discomfort you are trying to eliminate should never worsen during these exercises. If this pain does get worse, stop and make sure you are following the directions exactly. If the pain is still present after adjustments, discontinue the exercise until you can discuss the trouble with your clinician.  During your recovery, avoid activity or exercises which involve actions that place your injured hand or elbow above your head or behind your back or head. These positions stress the tissues which you are trying to heal. STRENGTH - Scapular Depression and Adduction   With good posture, sit on a firm chair. Support your arms in front of you, with pillows, arm rests, or on a table top. Have your elbows in line with the  sides of your body.  Gently draw your shoulder blades down and toward your mid-back spine. Gradually increase the tension, without tensing the muscles along the top of your shoulders and the back of your neck.  Hold for __________ seconds. Slowly release the tension and relax your muscles completely before starting the next repetition.  After you have practiced this exercise, remove the arm support and complete the exercise in standing as well as sitting position. Repeat __________ times. Complete this exercise __________ times per day.  STRENGTH - Shoulder Abductors, Isometric  With good posture, stand or sit about 4-6 inches from a wall, with your right / left side facing the wall.  Bend your right / left elbow. Gently press your right / left elbow into the wall. Increase the pressure gradually, until you are pressing as hard as you can, without shrugging your shoulder or increasing any shoulder discomfort.  Hold for __________ seconds.  Release the tension slowly. Relax your shoulder muscles completely before you begin the next repetition. Repeat __________ times. Complete this exercise __________ times per day.  STRENGTH - External Rotators, Isometric  Keep your right / left elbow at your side and bend it 90 degrees.  Step into a door frame so that the outside of your right / left wrist can press against the door frame without your upper arm leaving your side.  Gently press your right / left wrist into the door frame, as if you were trying to swing the back of your hand away from your stomach. Gradually increase the tension, until you are pressing as hard as you can, without shrugging your shoulder or increasing any shoulder discomfort.  Hold for __________ seconds.  Release the tension slowly. Relax your shoulder muscles completely before you begin the next repetition. Repeat __________ times. Complete this  exercise __________ times per day.  STRENGTH - Supraspinatus   Stand or  sit with good posture. Grasp a __________ weight, or an exercise band or tubing, so that your hand is "thumbs-up," like you are shaking hands.  Slowly lift your right / left arm in a "V" away from your thigh, diagonally into the space between your side and straight ahead. Lift your hand to shoulder height or as far as you can, without increasing any shoulder pain. At first, many people do not lift their hands above shoulder height.  Avoid shrugging your right / left shoulder as your arm rises, by keeping your shoulder blade tucked down and toward your mid-back spine.  Hold for __________ seconds. Control the descent of your hand, as you slowly return to your starting position. Repeat __________ times. Complete this exercise __________ times per day.  STRENGTH - External Rotators  Secure a rubber exercise band or tubing to a fixed object (table, pole) so that it is at the same height as your right / left elbow when you are standing or sitting on a firm surface.  Stand or sit so that the secured exercise band is at your uninjured side.  Bend your right / left elbow 90 degrees. Place a folded towel or small pillow under your right / left arm, so that your elbow is a few inches away from your side.  Keeping the tension on the exercise band, pull it away from your body, as if pivoting on your elbow. Be sure to keep your body steady, so that the movement is coming only from your rotating shoulder.  Hold for __________ seconds. Release the tension in a controlled manner, as you return to the starting position. Repeat __________ times. Complete this exercise __________ times per day.  STRENGTH - Internal Rotators   Secure a rubber exercise band or tubing to a fixed object (table, pole) so that it is at the same height as your right / left elbow when you are standing or sitting on a firm surface.  Stand or sit so that the secured exercise band is at your right / left side.  Bend your elbow 90  degrees. Place a folded towel or small pillow under your right / left arm so that your elbow is a few inches away from your side.  Keeping the tension on the exercise band, pull it across your body, toward your stomach. Be sure to keep your body steady, so that the movement is coming only from your rotating shoulder.  Hold for __________ seconds. Release the tension in a controlled manner, as you return to the starting position. Repeat __________ times. Complete this exercise __________ times per day.  STRENGTH - Scapular Protractors, Standing   Stand arms length away from a wall. Place your hands on the wall, keeping your elbows straight.  Begin by dropping your shoulder blades down and toward your mid-back spine.  To strengthen your protractors, keep your shoulder blades down, but slide them forward on your rib cage. It will feel as if you are lifting the back of your rib cage away from the wall. This is a subtle motion and can be challenging to complete. Ask your caregiver for further instruction, if you are not sure you are doing the exercise correctly.  Hold for __________ seconds. Slowly return to the starting position, resting the muscles completely before starting the next repetition. Repeat __________ times. Complete this exercise __________ times per day. STRENGTH - Scapular Protractors, Supine  Lorenz Coaster  on your back on a firm surface. Extend your right / left arm straight into the air while holding a __________ weight in your hand.  Keeping your head and back in place, lift your shoulder off the floor.  Hold for __________ seconds. Slowly return to the starting position, and allow your muscles to relax completely before starting the next repetition. Repeat __________ times. Complete this exercise __________ times per day. STRENGTH - Scapular Protractors, Quadruped  Get onto your hands and knees, with your shoulders directly over your hands (or as close as you can be,  comfortably).  Keeping your elbows locked, lift the back of your rib cage up into your shoulder blades, so your mid-back rounds out. Keep your neck muscles relaxed.  Hold this position for __________ seconds. Slowly return to the starting position and allow your muscles to relax completely before starting the next repetition. Repeat __________ times. Complete this exercise __________ times per day.  STRENGTH - Scapular Retractors  Secure a rubber exercise band or tubing to a fixed object (table, pole), so that it is at the height of your shoulders when you are either standing, or sitting on a firm armless chair.  With a palm down grip, grasp an end of the band in each hand. Straighten your elbows and lift your hands straight in front of you, at shoulder height. Step back, away from the secured end of the band, until it becomes tense.  Squeezing your shoulder blades together, draw your elbows back toward your sides, as you bend them. Keep your upper arms lifted away from your body throughout the exercise.  Hold for __________ seconds. Slowly ease the tension on the band, as you reverse the directions and return to the starting position. Repeat __________ times. Complete this exercise __________ times per day. STRENGTH - Shoulder Extensors   Secure a rubber exercise band or tubing to a fixed object (table, pole) so that it is at the height of your shoulders when you are either standing, or sitting on a firm armless chair.  With a thumbs-up grip, grasp an end of the band in each hand. Straighten your elbows and lift your hands straight in front of you, at shoulder height. Step back, away from the secured end of the band, until it becomes tense.  Squeezing your shoulder blades together, pull your hands down to the sides of your thighs. Do not allow your hands to go behind you.  Hold for __________ seconds. Slowly ease the tension on the band, as you reverse the directions and return to the  starting position. Repeat __________ times. Complete this exercise __________ times per day.  STRENGTH - Scapular Retractors and External Rotators   Secure a rubber exercise band or tubing to a fixed object (table, pole) so that it is at the height as your shoulders, when you are either standing, or sitting on a firm armless chair.  With a palm down grip, grasp an end of the band in each hand. Bend your elbows 90 degrees and lift your elbows to shoulder height, at your sides. Step back, away from the secured end of the band, until it becomes tense.  Squeezing your shoulder blades together, rotate your shoulders so that your upper arms and elbows remain stationary, but your fists travel upward to head height.  Hold for __________ seconds. Slowly ease the tension on the band, as you reverse the directions and return to the starting position. Repeat __________ times. Complete this exercise __________ times per day.  STRENGTH - Scapular Retractors and External Rotators, Rowing   Secure a rubber exercise band or tubing to a fixed object (table, pole) so that it is at the height of your shoulders, when you are either standing, or sitting on a firm armless chair.  With a palm down grip, grasp an end of the band in each hand. Straighten your elbows and lift your hands straight in front of you, at shoulder height. Step back, away from the secured end of the band, until it becomes tense.  Step 1: Squeeze your shoulder blades together. Bending your elbows, draw your hands to your chest, as if you are rowing a boat. At the end of this motion, your hands and elbow should be at shoulder height and your elbows should be out to your sides.  Step 2: Rotate your shoulders, to raise your hands above your head. Your forearms should be vertical and your upper arms should be horizontal.  Hold for __________ seconds. Slowly ease the tension on the band, as you reverse the directions and return to the starting  position. Repeat __________ times. Complete this exercise __________ times per day.  STRENGTH - Scapular Depressors  Find a sturdy chair without wheels, such as a dining room chair.  Keeping your feet on the floor, and your hands on the chair arms, lift your bottom up from the seat, and lock your elbows.  Keeping your elbows straight, allow gravity to pull your body weight down. Your shoulders will rise toward your ears.  Raise your body against gravity by drawing your shoulder blades down your back, shortening the distance between your shoulders and ears. Although your feet should always maintain contact with the floor, your feet should progressively support less body weight, as you get stronger.  Hold for __________ seconds. In a controlled and slow manner, lower your body weight to begin the next repetition. Repeat __________ times. Complete this exercise __________ times per day.    This information is not intended to replace advice given to you by your health care provider. Make sure you discuss any questions you have with your health care provider.   Document Released: 12/14/2005 Document Revised: 01/04/2015 Document Reviewed: 03/28/2009 Elsevier Interactive Patient Education [employer]

## 2016-08-05 ENCOUNTER — Encounter: Payer: Self-pay | Admitting: Internal Medicine

## 2016-08-05 ENCOUNTER — Ambulatory Visit (INDEPENDENT_AMBULATORY_CARE_PROVIDER_SITE_OTHER): Payer: 59 | Admitting: Internal Medicine

## 2016-08-05 VITALS — BP 120/70 | HR 75 | Wt 113.0 lb

## 2016-08-05 DIAGNOSIS — E559 Vitamin D deficiency, unspecified: Secondary | ICD-10-CM | POA: Insufficient documentation

## 2016-08-05 DIAGNOSIS — S93401A Sprain of unspecified ligament of right ankle, initial encounter: Secondary | ICD-10-CM | POA: Diagnosis not present

## 2016-08-05 DIAGNOSIS — E538 Deficiency of other specified B group vitamins: Secondary | ICD-10-CM | POA: Diagnosis not present

## 2016-08-05 DIAGNOSIS — M25511 Pain in right shoulder: Secondary | ICD-10-CM | POA: Diagnosis not present

## 2016-08-05 MED ORDER — VITAMIN B-12 1000 MCG SL SUBL: 1.0000 | SUBLINGUAL_TABLET | Freq: Every day | SUBLINGUAL | 3 refills | Status: DC

## 2016-08-05 MED ORDER — VITAMIN D3 50 MCG (2000 UT) PO CAPS
2000.0000 [IU] | ORAL_CAPSULE | Freq: Every day | ORAL | 3 refills | Status: DC
Start: 2016-08-05 — End: 2017-06-26

## 2016-08-05 MED ORDER — METHYLPREDNISOLONE ACETATE 40 MG/ML IJ SUSP
40.0000 mg | Freq: Once | INTRAMUSCULAR | Status: AC
  Administered 2016-08-05: 40 mg via INTRAMUSCULAR

## 2016-08-05 MED ORDER — ERGOCALCIFEROL 1.25 MG (50000 UT) PO CAPS: 50000.0000 [IU] | ORAL_CAPSULE | ORAL | 0 refills | Status: DC

## 2016-08-05 MED ORDER — NAPROXEN-ESOMEPRAZOLE 500-20 MG PO TBEC: 1.0000 | DELAYED_RELEASE_TABLET | Freq: Two times a day (BID) | ORAL | 3 refills | Status: DC | PRN

## 2016-08-05 MED ORDER — CYANOCOBALAMIN 1000 MCG/ML IJ SOLN
1000.0000 ug | Freq: Once | INTRAMUSCULAR | Status: AC
  Administered 2016-08-05: 1000 ug via INTRAMUSCULAR

## 2016-08-05 MED ORDER — METHYLPREDNISOLONE ACETATE 40 MG/ML IJ SUSP: 40.0000 mg | Freq: Once | INTRAMUSCULAR | Status: DC

## 2016-08-05 NOTE — Patient Instructions (Addendum)
Postprocedure instructions :    A Band-Aid should be left on for 12 hours. Injection therapy is not a cure itself. It is used in conjunction with other modalities. You can use nonsteroidal anti-inflammatories like ibuprofen , hot and cold compresses. Rest is recommended in the next 24 hours. You need to report immediately  if fever, chills or any signs of infection develop.  You have a low Vitamin D levels - please, start Vit D prescription 50000 iu weekly  followed by over-the-counter Vit D 2000 iu daily.

## 2016-08-05 NOTE — Assessment & Plan Note (Signed)
Start Vit B12 again IM inj 1000 mcg Risks associated with treatment noncompliance were discussed. Compliance was encouraged.

## 2016-08-05 NOTE — Progress Notes (Signed)
Subjective:  Patient ID: Gabriela Le, female    DOB: 06/11/1976  Age: 40 y.o. MRN: [id number]  CC: No chief complaint on file.   HPI Gabriela Le presents for R shoulder pain, R knee pain, R ankle pain. F/u Vit B12 and vit D def - not taking meds...  Outpatient Medications Prior to Visit  Medication Sig Dispense Refill  . ranitidine (ZANTAC) 150 MG capsule Take 1 capsule (150 mg total) by mouth 2 (two) times daily. 60 capsule 11  . Diclofenac Sodium (PENNSAID) 2 % SOLN Place 1 application onto the skin 2 (two) times daily as needed. (Patient not taking: Reported on 08/05/2016) 112 g 1  . Naproxen-Esomeprazole 500-20 MG TBEC Take 1 tablet by mouth 2 (two) times daily as needed. (Patient not taking: Reported on 08/05/2016) 60 tablet 0   No facility-administered medications prior to visit.     ROS Review of Systems  Constitutional: Negative for activity change, appetite change, chills, fatigue and unexpected weight change.  HENT: Negative for congestion, mouth sores and sinus pressure.   Eyes: Negative for visual disturbance.  Respiratory: Negative for cough and chest tightness.   Gastrointestinal: Negative for abdominal pain and nausea.  Genitourinary: Negative for difficulty urinating, frequency and vaginal pain.  Musculoskeletal: Positive for arthralgias. Negative for back pain and gait problem.  Skin: Negative for pallor and rash.  Neurological: Negative for dizziness, tremors, weakness, numbness and headaches.  Psychiatric/Behavioral: Negative for confusion and sleep disturbance.    Objective:  BP 120/70   Pulse 75   Wt 113 lb (51.3 kg)   LMP 06/29/2016   SpO2 98%   BMI 25.33 kg/m   BP Readings from Last 3 Encounters:  08/05/16 120/70  07/17/16 128/72  02/12/16 110/80    Wt Readings from Last 3 Encounters:  08/05/16 113 lb (51.3 kg)  07/17/16 113 lb (51.3 kg)  02/12/16 115 lb (52.2 kg)    Physical Exam  Constitutional: She appears well-developed. No  distress.  HENT:  Head: Normocephalic.  Right Ear: External ear normal.  Left Ear: External ear normal.  Nose: Nose normal.  Mouth/Throat: Oropharynx is clear and moist.  Eyes: Conjunctivae are normal. Pupils are equal, round, and reactive to light. Right eye exhibits no discharge. Left eye exhibits no discharge.  Neck: Normal range of motion. Neck supple. No JVD present. No tracheal deviation present. No thyromegaly present.  Cardiovascular: Normal rate, regular rhythm and normal heart sounds.   Pulmonary/Chest: No stridor. No respiratory distress. She has no wheezes.  Abdominal: Soft. Bowel sounds are normal. She exhibits no distension and no mass. There is no tenderness. There is no rebound and no guarding.  Musculoskeletal: She exhibits tenderness. She exhibits no edema.  Lymphadenopathy:    She has no cervical adenopathy.  Neurological: She displays normal reflexes. No cranial nerve deficit. She exhibits normal muscle tone. Coordination normal.  Skin: No rash noted. No erythema.  Psychiatric: She has a normal mood and affect. Her behavior is normal. Judgment and thought content normal.  R shoulder and R knee, ankle - tender    Procedure :Joint Injection, R  shoulder   Indication:  Subacromial bursitis with refractory  chronic pain.   Risks including unsuccessful procedure , bleeding, infection, bruising, skin atrophy, "steroid flare-up" and others were explained to the patient in detail as well as the benefits. Informed consent was obtained and signed.   Tthe patient was placed in a comfortable position. Lateral approach was used. Skin was prepped with  Betadine and alcohol  and anesthetized with a cooling spray. Then, a 5 cc syringe with a 2 inch long 24-gauge needle was used for a joint injection.. The needle was advanced  Into the subacromial space.The bursa was injected with 3 mL of 2% lidocaine and 40 mg of Depo-Medrol .  Band-Aid was applied.   Tolerated well. Complications:  None. Good pain relief following the procedure.   Postprocedure instructions :    A Band-Aid should be left on for 12 hours. Injection therapy is not a cure itself. It is used in conjunction with other modalities. You can use nonsteroidal anti-inflammatories like ibuprofen , hot and cold compresses. Rest is recommended in the next 24 hours. You need to report immediately  if fever, chills or any signs of infection develop.   Lab Results  Component Value Date   WBC 8.3 02/12/2016   HGB 12.7 02/12/2016   HCT 37.7 02/12/2016   PLT 241.0 02/12/2016   GLUCOSE 91 02/12/2016   CHOL 169 04/29/2010   TRIG 163.0 (H) 04/29/2010   HDL 44.10 04/29/2010   LDLCALC 92 04/29/2010   ALT 7 02/12/2016   AST 13 02/12/2016   NA 137 02/12/2016   K 4.1 02/12/2016   CL 103 02/12/2016   CREATININE 0.49 02/12/2016   BUN 10 02/12/2016   CO2 28 02/12/2016   TSH 1.69 02/12/2016    Dg Shoulder Right  Result Date: 07/17/2016 CLINICAL DATA:  Chronic right shoulder pain.  No known injury. EXAM: RIGHT SHOULDER - 2+ VIEW COMPARISON:  None. FINDINGS: There is no evidence of fracture or dislocation. There is no evidence of arthropathy or other focal bone abnormality. Soft tissues are unremarkable. IMPRESSION: Negative. Electronically Signed   By: Gerome Sam III M.D   On: 07/17/2016 23:30    Assessment & Plan:   Diagnoses and all orders for this visit:  Right ankle sprain, initial encounter -     Naproxen-Esomeprazole 500-20 MG TBEC; Take 1 tablet by mouth 2 (two) times daily as needed.  Right shoulder pain -     Naproxen-Esomeprazole 500-20 MG TBEC; Take 1 tablet by mouth 2 (two) times daily as needed.  Other orders -     Cyanocobalamin (VITAMIN B-12) 1000 MCG SUBL; Place 1 tablet (1,000 mcg total) under the tongue daily. -     ergocalciferol (VITAMIN D2) 50000 units capsule; Take 1 capsule (50,000 Units total) by mouth once a week. -     Cholecalciferol (VITAMIN D3) 2000 units capsule; Take 1 capsule  (2,000 Units total) by mouth daily.   I have discontinued Gabriela Le's ranitidine. I am also having her start on Vitamin B-12, ergocalciferol, and Vitamin D3. Additionally, I am having her maintain her Diclofenac Sodium and Naproxen-Esomeprazole.  Meds ordered this encounter  Medications  . Naproxen-Esomeprazole 500-20 MG TBEC    Sig: Take 1 tablet by mouth 2 (two) times daily as needed.    Dispense:  60 tablet    Refill:  3  . Cyanocobalamin (VITAMIN B-12) 1000 MCG SUBL    Sig: Place 1 tablet (1,000 mcg total) under the tongue daily.    Dispense:  100 tablet    Refill:  3  . ergocalciferol (VITAMIN D2) 50000 units capsule    Sig: Take 1 capsule (50,000 Units total) by mouth once a week.    Dispense:  6 capsule    Refill:  0  . Cholecalciferol (VITAMIN D3) 2000 units capsule    Sig: Take 1 capsule (2,000 Units total)  by mouth daily.    Dispense:  100 capsule    Refill:  3     Follow-up: No Follow-up on file.  Sonda Primes, MD

## 2016-08-05 NOTE — Assessment & Plan Note (Signed)
Start Vit D prescription 50000 iu weekly  followed by over-the-counter Vit D 2000 iu daily. Risks associated with treatment noncompliance were discussed. Compliance was encouraged.

## 2016-08-05 NOTE — Assessment & Plan Note (Signed)
Vit D Will inject Vimovo ROM exercises

## 2016-08-05 NOTE — Progress Notes (Signed)
Pre visit review using our clinic review tool, if applicable. No additional management support is needed unless otherwise documented below in the visit note. 

## 2016-08-05 NOTE — Assessment & Plan Note (Signed)
Better ACE wrap prn

## 2017-01-30 ENCOUNTER — Encounter (HOSPITAL_COMMUNITY): Payer: Self-pay

## 2017-01-30 ENCOUNTER — Inpatient Hospital Stay (HOSPITAL_COMMUNITY)
Admission: AD | Admit: 2017-01-30 | Discharge: 2017-01-30 | Disposition: A | Discharge status: Home / Self Care | Payer: Medicaid Other | Source: Ambulatory Visit | Attending: Obstetrics & Gynecology | Admitting: Obstetrics & Gynecology

## 2017-01-30 DIAGNOSIS — O9989 Other specified diseases and conditions complicating pregnancy, childbirth and the puerperium: Secondary | ICD-10-CM | POA: Diagnosis not present

## 2017-01-30 DIAGNOSIS — Z3A11 11 weeks gestation of pregnancy: Secondary | ICD-10-CM | POA: Diagnosis not present

## 2017-01-30 DIAGNOSIS — R112 Nausea with vomiting, unspecified: Secondary | ICD-10-CM | POA: Diagnosis not present

## 2017-01-30 DIAGNOSIS — O219 Vomiting of pregnancy, unspecified: Secondary | ICD-10-CM | POA: Insufficient documentation

## 2017-01-30 DIAGNOSIS — R197 Diarrhea, unspecified: Secondary | ICD-10-CM | POA: Insufficient documentation

## 2017-01-30 DIAGNOSIS — O99611 Diseases of the digestive system complicating pregnancy, first trimester: Secondary | ICD-10-CM | POA: Insufficient documentation

## 2017-01-30 DIAGNOSIS — K219 Gastro-esophageal reflux disease without esophagitis: Secondary | ICD-10-CM | POA: Insufficient documentation

## 2017-01-30 DIAGNOSIS — A084 Viral intestinal infection, unspecified: Secondary | ICD-10-CM | POA: Diagnosis not present

## 2017-01-30 LAB — URINALYSIS, ROUTINE W REFLEX MICROSCOPIC
BILIRUBIN URINE: NEGATIVE
Glucose, UA: NEGATIVE mg/dL
Ketones, ur: NEGATIVE mg/dL
Leukocytes, UA: NEGATIVE
NITRITE: NEGATIVE
PROTEIN: NEGATIVE mg/dL
Specific Gravity, Urine: 1.012 (ref 1.005–1.030)
pH: 6 (ref 5.0–8.0)

## 2017-01-30 LAB — POCT PREGNANCY, URINE: Preg Test, Ur: POSITIVE — AB

## 2017-01-30 MED ORDER — ONDANSETRON 8 MG PO TBDP: 8.0000 mg | ORAL_TABLET | Freq: Three times a day (TID) | ORAL | 4 refills | Status: DC | PRN

## 2017-01-30 MED ORDER — ONDANSETRON 8 MG PO TBDP
8.0000 mg | ORAL_TABLET | Freq: Once | ORAL | Status: AC
  Administered 2017-01-30: 8 mg via ORAL
  Filled 2017-01-30: qty 1

## 2017-01-30 NOTE — Discharge Instructions (Signed)

## 2017-01-30 NOTE — MAU Note (Signed)
Nausea and diarrhea for 2 days. States she has had a history of constipation with pregnancy.

## 2017-01-30 NOTE — MAU Provider Note (Signed)
History   [id number] @  11.6 wks in with nausea, vomiting and diarrhea x 2 days. States given med for nausea and spitting and her OB visit the other day.    CSN: [id number]  Arrival date & time 01/30/17  1833   None     Chief Complaint  Patient presents with  . Nausea  . Diarrhea    HPI  Past Medical History:  Diagnosis Date  . GERD (gastroesophageal reflux disease)   . H/O cold sores    ACYCLOVIR OINTMENT PRN  . Infertility associated with anovulation    HAS TAKEN CLOMID IN THE PAST  . Irregular menses   . Missed ab 12/29/2010   no surgery required  . 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 APPENDECTOMY     6 YOA  . CESAREAN SECTION  01/06/2013   Procedure: CESAREAN SECTION;  Surgeon: Hal MoralesVanessa P Haygood, MD;  Location: [facility];  Service: Obstetrics;  Laterality: N/A;  Primary  . CESAREAN SECTION N/A 10/09/2014   Procedure: CESAREAN SECTION;  Surgeon: Konrad FelixEma Wakuru Kulwa, MD;  Location: [facility];  Service: Obstetrics;  Laterality: N/A;  . CHOLECYSTECTOMY N/A 11/27/2014   Procedure: LAPAROSCOPIC CHOLECYSTECTOMY WITH INTRAOPERATIVE CHOLANGIOGRAM;  Surgeon: Chevis PrettyPaul Toth III, MD;  Location: [facility];  Service: General;  Laterality: N/A;    Family History  Problem Relation Age of Onset  . Heart disease Mother     PALPITATIONS  . Hypertension Mother     Social History  Substance Use Topics  . Smoking status: Never Smoker  . Smokeless tobacco: Never Used  . Alcohol use No    OB History    Gravida Para Term Preterm AB Living   4 2 2   1 2    SAB TAB Ectopic Multiple Live Births   1 0     2      Review of Systems  Constitutional: Negative.   HENT: Negative.   Eyes: Negative.   Respiratory: Negative.   Cardiovascular: Negative.   Gastrointestinal: Positive for diarrhea, nausea and vomiting.  Endocrine: Negative.   Genitourinary: Negative.   Musculoskeletal: Negative.     Allergies  Patient has no known allergies.  Home Medications     LMP 11/08/2016   Physical Exam  Constitutional: She is oriented to person, place, and time. She appears well-developed and well-nourished.  HENT:  Head: Normocephalic.  Eyes: Pupils are equal, round, and reactive to light.  Neck: Normal range of motion.  Cardiovascular: Normal rate, regular rhythm, normal heart sounds and intact distal pulses.   Pulmonary/Chest: Effort normal and breath sounds normal.  Abdominal: Soft. Bowel sounds are normal.  Musculoskeletal: Normal range of motion.  Neurological: She is alert and oriented to person, place, and time. She has normal reflexes.  Skin: Skin is warm and dry.  Psychiatric: She has a normal mood and affect. Her behavior is normal. Judgment and thought content normal.    MAU Course  Procedures (including critical care time)  Labs Reviewed  URINALYSIS, ROUTINE W REFLEX MICROSCOPIC   No results found.   1. Nausea vomiting and diarrhea   2. Viral gastroenteritis       MDM  Pt retaining fluids well after po zofran. Urine normal no ketones. POC discussed with Dr Sallye OberKulwa. Pt to be d/c home to return as necessary

## 2017-02-15 ENCOUNTER — Ambulatory Visit (INDEPENDENT_AMBULATORY_CARE_PROVIDER_SITE_OTHER): Payer: Medicaid Other | Admitting: Certified Nurse Midwife

## 2017-02-15 ENCOUNTER — Encounter: Payer: Self-pay | Admitting: Certified Nurse Midwife

## 2017-02-15 ENCOUNTER — Other Ambulatory Visit (HOSPITAL_COMMUNITY)
Admission: RE | Admit: 2017-02-15 | Discharge: 2017-02-15 | Disposition: A | Discharge status: Home / Self Care | Payer: BLUE CROSS/BLUE SHIELD | Source: Ambulatory Visit | Attending: Certified Nurse Midwife | Admitting: Certified Nurse Midwife

## 2017-02-15 VITALS — BP 116/82 | HR 90 | Wt 117.0 lb

## 2017-02-15 DIAGNOSIS — R011 Cardiac murmur, unspecified: Secondary | ICD-10-CM

## 2017-02-15 DIAGNOSIS — O09529 Supervision of elderly multigravida, unspecified trimester: Secondary | ICD-10-CM | POA: Insufficient documentation

## 2017-02-15 DIAGNOSIS — O0990 Supervision of high risk pregnancy, unspecified, unspecified trimester: Secondary | ICD-10-CM

## 2017-02-15 DIAGNOSIS — Z1151 Encounter for screening for human papillomavirus (HPV): Secondary | ICD-10-CM | POA: Diagnosis not present

## 2017-02-15 DIAGNOSIS — Z01419 Encounter for gynecological examination (general) (routine) without abnormal findings: Secondary | ICD-10-CM | POA: Insufficient documentation

## 2017-02-15 DIAGNOSIS — Z789 Other specified health status: Secondary | ICD-10-CM | POA: Insufficient documentation

## 2017-02-15 DIAGNOSIS — Z113 Encounter for screening for infections with a predominantly sexual mode of transmission: Secondary | ICD-10-CM | POA: Diagnosis present

## 2017-02-15 DIAGNOSIS — O219 Vomiting of pregnancy, unspecified: Secondary | ICD-10-CM

## 2017-02-15 DIAGNOSIS — O34219 Maternal care for unspecified type scar from previous cesarean delivery: Secondary | ICD-10-CM

## 2017-02-15 DIAGNOSIS — O09522 Supervision of elderly multigravida, second trimester: Secondary | ICD-10-CM | POA: Diagnosis not present

## 2017-02-15 MED ORDER — ACYCLOVIR 5 % EX OINT: 1.0000 "application " | TOPICAL_OINTMENT | Freq: Two times a day (BID) | CUTANEOUS | 0 refills | Status: DC

## 2017-02-15 MED ORDER — PRENATE PIXIE 10-0.6-0.4-200 MG PO CAPS: 1.0000 | ORAL_CAPSULE | Freq: Every day | ORAL | 12 refills | Status: DC

## 2017-02-15 MED ORDER — ONDANSETRON HCL 8 MG PO TABS: 8.0000 mg | ORAL_TABLET | Freq: Three times a day (TID) | ORAL | 2 refills | Status: DC | PRN

## 2017-02-15 MED ORDER — DOXYLAMINE-PYRIDOXINE 10-10 MG PO TBEC: DELAYED_RELEASE_TABLET | ORAL | 4 refills | Status: DC

## 2017-02-15 NOTE — Progress Notes (Signed)
Subjective:    Gabriela Le is being seen today for her first obstetrical visit.  This is a planned pregnancy. She is at [redacted]w[redacted]d gestation. Her obstetrical history is significant for advanced maternal age and previous c-section X2: LTCS. Relationship with FOB: spouse, living together. Patient does intend to breast feed. Pregnancy history fully reviewed.  Here for exam with interpreter.   The information documented in the HPI was reviewed and verified.  Menstrual History: OB History    Gravida Para Term Preterm AB Living   4 2 2   1 2    SAB TAB Ectopic Multiple Live Births   1 0     2       Patient's last menstrual period was 11/08/2016.    Past Medical History:  Diagnosis Date  . GERD (gastroesophageal reflux disease)   . H/O cold sores    ACYCLOVIR OINTMENT PRN  . Infertility associated with anovulation    HAS TAKEN CLOMID IN THE PAST  . Irregular menses   . Missed ab 12/29/2010   no surgery required  . 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 APPENDECTOMY     6 YOA  . CESAREAN SECTION  01/06/2013   Procedure: CESAREAN SECTION;  Surgeon: Hal Morales, MD;  Location: [facility];  Service: Obstetrics;  Laterality: N/A;  Primary  . CESAREAN SECTION N/A 10/09/2014   Procedure: CESAREAN SECTION;  Surgeon: Konrad Felix, MD;  Location: [facility];  Service: Obstetrics;  Laterality: N/A;  . CHOLECYSTECTOMY N/A 11/27/2014   Procedure: LAPAROSCOPIC CHOLECYSTECTOMY WITH INTRAOPERATIVE CHOLANGIOGRAM;  Surgeon: Chevis Pretty III, MD;  Location: [facility];  Service: General;  Laterality: N/A;  . CYSTECTOMY       (Not in a hospital admission) No Known Allergies  Social History  Substance Use Topics  . Smoking status: Never Smoker  . Smokeless tobacco: Never Used  . Alcohol use No    Family History  Problem Relation Age of Onset  . Heart disease Mother     PALPITATIONS  . Hypertension Mother      Review of Systems Constitutional: negative for  weight loss Gastrointestinal: negative for vomiting Genitourinary:negative for genital lesions and vaginal discharge and dysuria Musculoskeletal:negative for back pain Behavioral/Psych: negative for abusive relationship, depression, illegal drug usage and tobacco use    Objective:    BP 116/82   Pulse 90   Wt 117 lb (53.1 kg)   LMP 11/08/2016   BMI 26.23 kg/m  General Appearance:    Alert, cooperative, no distress, appears stated age  Head:    Normocephalic, without obvious abnormality, atraumatic  Eyes:    PERRL, conjunctiva/corneas clear, EOM's intact, fundi    benign, both eyes  Ears:    Normal TM's and external ear canals, both ears  Nose:   Nares normal, septum midline, mucosa normal, no drainage    or sinus tenderness  Throat:   Lips, mucosa, and tongue normal; teeth and gums normal  Neck:   Supple, symmetrical, trachea midline, no adenopathy;    thyroid:  no enlargement/tenderness/nodules; no carotid   bruit or JVD  Back:     Symmetric, no curvature, ROM normal, no CVA tenderness  Lungs:     Clear to auscultation bilaterally, respirations unlabored  Chest Wall:    No tenderness or deformity   Heart:    Regular rate and rhythm, S1 and S2 normal, + murmur, no rub   or gallop  Breast Exam:  No tenderness, masses, or nipple abnormality  Abdomen:     Soft, non-tender, bowel sounds active all four quadrants,    no masses, no organomegaly  Genitalia:    Normal female without lesion, discharge or tenderness, 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  FHR: 158 by doppler.  Size c/w dates.      FH: less than U.       Lab Review Urine pregnancy test Labs reviewed yes Radiologic studies reviewed no Assessment:    Pregnancy at [redacted]w[redacted]d weeks   Supervision of high risk pregnancy, antepartum - Plan: Cytology - PAP, Cervicovaginal ancillary only, Hemoglobinopathy evaluation, VITAMIN D 25 Hydroxy (Vit-D Deficiency, Fractures), Varicella zoster antibody, IgG, Culture, OB Urine, Cystic Fibrosis Mutation 97, Obstetric Panel, Including HIV, Hemoglobin A1c, MaterniT Genome, [place] MFM OB DETAIL +14 WK, Prenat-FeAsp-Meth-FA-DHA w/o A (PRENATE PIXIE) 10-0.6-0.4-200 MG CAPS  Advanced maternal age in multigravida, unspecified trimester - Plan: Cytology - PAP, Cervicovaginal ancillary only, Hemoglobinopathy evaluation, VITAMIN D 25 Hydroxy (Vit-D Deficiency, Fractures), Varicella zoster antibody, IgG, Culture, OB Urine, Cystic Fibrosis Mutation 97, Obstetric Panel, Including HIV, Hemoglobin A1c, MaterniT Genome, [place] MFM OB DETAIL +14 WK  Previous cesarean delivery, antepartum condition or complication - Plan: Cytology - PAP, Cervicovaginal ancillary only, Hemoglobinopathy evaluation, VITAMIN D 25 Hydroxy (Vit-D Deficiency, Fractures), Varicella zoster antibody, IgG, Culture, OB Urine, Cystic Fibrosis Mutation 97, Obstetric Panel, Including HIV, Hemoglobin A1c, MaterniT Genome, [place] MFM OB DETAIL +14 WK  Nausea and vomiting during pregnancy prior to [redacted] weeks gestation - Plan: Doxylamine-Pyridoxine (DICLEGIS) 10-10 MG TBEC, ondansetron (ZOFRAN) 8 MG tablet  Cardiac murmur, previously undiagnosed - Plan: Ambulatory referral to Cardiology    Plan:     Prenatal vitamins.  Counseling provided regarding continued use of seat belts, cessation of alcohol consumption, smoking or use of illicit drugs; infection precautions i.e., influenza/TDAP immunizations, toxoplasmosis,CMV, parvovirus, listeria and varicella; workplace safety, exercise during pregnancy; routine dental care, safe medications, sexual activity, hot tubs, saunas, pools,  travel, caffeine use, fish and methlymercury, potential toxins, hair treatments, varicose veins Weight gain recommendations per IOM guidelines reviewed: underweight/BMI< 18.5--> gain 28 - 40 lbs; normal weight/BMI 18.5 - 24.9--> gain 25 - 35 lbs; overweight/BMI 25 - 29.9--> gain 15 - 25 lbs; obese/BMI >30->gain  11 - 20 lbs Problem list reviewed and updated. FIRST/CF mutation testing/NIPT/QUAD SCREEN/fragile X/Ashkenazi Jewish population testing/Spinal muscular atrophy discussed: ordered. Role of ultrasound in pregnancy discussed; fetal survey: ordered. Amniocentesis discussed: declined.  Meds ordered this encounter  Medications  . glycopyrrolate (ROBINUL) 1 MG tablet    Sig: Take 1 mg by mouth 3 (three) times daily.  Marland Kitchen DISCONTD: acyclovir ointment (ZOVIRAX) 5 %    Sig: Apply 1 application topically every 3 (three) hours.  . Doxylamine-Pyridoxine (DICLEGIS) 10-10 MG TBEC    Sig: Take 1 tablet with breakfast and lunch.  Take 2 tablets at bedtime.    Dispense:  100 tablet    Refill:  4  . ondansetron (ZOFRAN) 8 MG tablet    Sig: Take 1 tablet (8 mg total) by mouth every 8 (eight) hours as needed for  nausea or vomiting.    Dispense:  40 tablet    Refill:  2  . acyclovir ointment (ZOVIRAX) 5 %    Sig: Apply 1 application topically 2 (two) times daily.    Dispense:  5 g    Refill:  0  . Prenat-FeAsp-Meth-FA-DHA w/o A (PRENATE PIXIE) 10-0.6-0.4-200 MG CAPS    Sig: Take 1 tablet by mouth daily.    Dispense:  30 capsule    Refill:  12    Please process coupon: Rx BIN: [id number], RxPCN: [id number], RxGRP: [id number]: [id number], Rx: [id number]: [id number]  SUF: 01   Orders Placed This Encounter  Procedures  . Culture, OB Urine  . US MFM OB DETAIL +14 WK    Standing Status:   Future    Standing Expiration Date:   04/15/2018    Order Specific Question:   Reason for Exam (SYMPTOM  OR DIAGNOSIS REQUIRED)    Answer:   fetal anatomy    Order Specific Question:   Preferred imaging location?    Answer:   [facility]  .  Hemoglobinopathy evaluation  . VITAMIN D 25 Hydroxy (Vit-D Deficiency, Fractures)  . Varicella zoster antibody, IgG  . Cystic Fibrosis Mutation 97  . Obstetric Panel, Including HIV  . Hemoglobin A1c  . MaterniT Genome    Order Specific Question:   Is the patient insulin dependent?    Answer:   No    Order Specific Question:   Please enter gestational age. This should be expressed as weeks AND days, i.e. 16w 6d. Enter weeks here. Enter days in next question.    Answer:   3614    Order Specific Question:   Please enter gestational age. This should be expressed as weeks AND days, i.e. 16w 6d. Enter days here. Enter weeks in previous question.    Answer:   1    Order Specific Question:   How was gestational age calculated?    Answer:   LMP    Order Specific Question:   Please give the date of LMP OR Ultrasound OR Estimated date of delivery.    Answer:   08/15/2017    Order Specific Question:   Number of Fetuses (Type of Pregnancy):    Answer:   1    Order Specific Question:   Indications for performing the test? (please choose all that apply):    Answer:   Routine screening    Order Specific Question:   Other Indications? (Y=Yes, N=No)    Answer:   Y    Order Specific Question:   Please specify other indications, if any:    Answer:   AMA    Order Specific Question:   If this is a repeat specimen, please indicate the reason:    Answer:   Not indicated    Order Specific Question:   Please specify the patient's race: (C=White/Caucasion, B=Black, I=Native American, A=Asian, H=Hispanic, O=Other, U=Unknown)    Answer:   B    Order Specific Question:   Donor Egg - indicate if the egg was obtained from in vitro fertilization.    Answer:   N    Order Specific Question:   Age of Egg Donor.    Answer:   [age]    Order Specific Question:   Prior Down Syndrome/ONTD screening during current pregnancy.    Answer:   N    Order Specific Question:   Prior First Trimester Testing    Answer:   N    Order  Specific Question:  Prior Second Trimester Testing    Answer:   N    Order Specific Question:   Family History of Neural Tube Defects    Answer:   N    Order Specific Question:   Prior Pregnancy with Down Syndrome    Answer:   N    Order Specific Question:   Please give the patient's weight (in pounds)    Answer:   117  . Ambulatory referral to Cardiology    Referral Priority:   Routine    Referral Type:   Consultation    Referral Reason:   Specialty Services Required    Requested Specialty:   Cardiology    Number of Visits Requested:   1    Follow up in 4 weeks. 50% of 30 min visit spent on counseling and coordination of care.

## 2017-02-16 LAB — CERVICOVAGINAL ANCILLARY ONLY
Bacterial vaginitis: NEGATIVE
CANDIDA VAGINITIS: NEGATIVE
Chlamydia: NEGATIVE
Neisseria Gonorrhea: NEGATIVE
TRICH (WINDOWPATH): NEGATIVE

## 2017-02-18 ENCOUNTER — Other Ambulatory Visit: Payer: Self-pay | Admitting: Certified Nurse Midwife

## 2017-02-18 DIAGNOSIS — O0990 Supervision of high risk pregnancy, unspecified, unspecified trimester: Secondary | ICD-10-CM

## 2017-02-18 LAB — CYTOLOGY - PAP
Diagnosis: NEGATIVE
HPV: NOT DETECTED

## 2017-02-18 LAB — CULTURE, OB URINE

## 2017-02-18 LAB — URINE CULTURE, OB REFLEX

## 2017-02-23 ENCOUNTER — Other Ambulatory Visit: Payer: Self-pay | Admitting: Certified Nurse Midwife

## 2017-02-23 DIAGNOSIS — O0990 Supervision of high risk pregnancy, unspecified, unspecified trimester: Secondary | ICD-10-CM

## 2017-02-23 LAB — HEMOGLOBINOPATHY EVALUATION
HGB A: 97.2 % (ref 96.4–98.8)
HGB C: 0 %
HGB S: 0 %
HGB VARIANT: 0 %
Hemoglobin A2 Quantitation: 2.8 % (ref 1.8–3.2)
Hemoglobin F Quantitation: 0 % (ref 0.0–2.0)

## 2017-02-23 LAB — VITAMIN D 25 HYDROXY (VIT D DEFICIENCY, FRACTURES): Vit D, 25-Hydroxy: 28.5 ng/mL — ABNORMAL LOW (ref 30.0–100.0)

## 2017-02-23 LAB — HEMOGLOBIN A1C
Est. average glucose Bld gHb Est-mCnc: 97 mg/dL
Hgb A1c MFr Bld: 5 % (ref 4.8–5.6)

## 2017-02-23 LAB — OBSTETRIC PANEL, INCLUDING HIV
ANTIBODY SCREEN: NEGATIVE
Basophils Absolute: 0 10*3/uL (ref 0.0–0.2)
Basos: 0 %
EOS (ABSOLUTE): 0 10*3/uL (ref 0.0–0.4)
EOS: 0 %
HEMATOCRIT: 36.6 % (ref 34.0–46.6)
HEMOGLOBIN: 12.3 g/dL (ref 11.1–15.9)
HIV Screen 4th Generation wRfx: NONREACTIVE
Hepatitis B Surface Ag: NEGATIVE
Immature Grans (Abs): 0 10*3/uL (ref 0.0–0.1)
Immature Granulocytes: 0 %
LYMPHS: 25 %
Lymphocytes Absolute: 2.2 10*3/uL (ref 0.7–3.1)
MCH: 30.8 pg (ref 26.6–33.0)
MCHC: 33.6 g/dL (ref 31.5–35.7)
MCV: 92 fL (ref 79–97)
MONOCYTES: 5 %
Monocytes Absolute: 0.4 10*3/uL (ref 0.1–0.9)
Neutrophils Absolute: 6.3 10*3/uL (ref 1.4–7.0)
Neutrophils: 70 %
Platelets: 244 10*3/uL (ref 150–379)
RBC: 3.99 x10E6/uL (ref 3.77–5.28)
RDW: 14.2 % (ref 12.3–15.4)
RH TYPE: POSITIVE
RPR Ser Ql: NONREACTIVE
RUBELLA: 13.9 {index} (ref >0.99)
WBC: 9 10*3/uL (ref 3.4–10.8)

## 2017-02-23 LAB — VARICELLA ZOSTER ANTIBODY, IGG: Varicella zoster IgG: 1541 index (ref >165)

## 2017-02-23 LAB — CYSTIC FIBROSIS MUTATION 97: GENE DIS ANAL CARRIER INTERP BLD/T-IMP: NOT DETECTED

## 2017-02-23 LAB — MATERNIT GENOME

## 2017-03-09 ENCOUNTER — Encounter (HOSPITAL_COMMUNITY): Payer: Self-pay | Admitting: Certified Nurse Midwife

## 2017-03-15 ENCOUNTER — Ambulatory Visit (HOSPITAL_COMMUNITY): Admission: RE | Admit: 2017-03-15 | Payer: Medicaid Other | Source: Ambulatory Visit

## 2017-03-15 ENCOUNTER — Encounter: Payer: Medicaid Other | Admitting: Certified Nurse Midwife

## 2017-04-06 ENCOUNTER — Other Ambulatory Visit: Payer: Self-pay | Admitting: *Deleted

## 2017-04-06 NOTE — Telephone Encounter (Signed)
Pt husband called to office stating his wife needs refill on [facility]. There are no refills currently available.  Husband was made aware that request will be sent to provider.  Please send refill to [facility] on [address] if approved.

## 2017-04-07 ENCOUNTER — Other Ambulatory Visit: Payer: Self-pay | Admitting: Certified Nurse Midwife

## 2017-04-07 DIAGNOSIS — K117 Disturbances of salivary secretion: Secondary | ICD-10-CM

## 2017-04-07 MED ORDER — GLYCOPYRROLATE 1 MG PO TABS: 1.0000 mg | ORAL_TABLET | Freq: Three times a day (TID) | ORAL | 3 refills | Status: DC

## 2017-04-07 NOTE — Telephone Encounter (Signed)
Please let them know it was sent with refills.  Thank you.  R.Emry Barbato CNM

## 2017-04-12 NOTE — Telephone Encounter (Signed)
Attempt to contact pt. No answer, no VM. 

## 2017-04-13 NOTE — Telephone Encounter (Signed)
Attempted both ph numbers on file message states "not accepting calls at this time"

## 2017-04-23 ENCOUNTER — Other Ambulatory Visit: Payer: Self-pay | Admitting: *Deleted

## 2017-04-23 ENCOUNTER — Other Ambulatory Visit: Payer: Self-pay | Admitting: Certified Nurse Midwife

## 2017-04-23 DIAGNOSIS — O219 Vomiting of pregnancy, unspecified: Secondary | ICD-10-CM

## 2017-04-23 MED ORDER — RANITIDINE HCL 150 MG PO CAPS: 150.0000 mg | ORAL_CAPSULE | Freq: Two times a day (BID) | ORAL | 3 refills | Status: DC

## 2017-04-23 NOTE — Telephone Encounter (Signed)
Review for RF

## 2017-05-05 ENCOUNTER — Other Ambulatory Visit: Payer: Self-pay | Admitting: Certified Nurse Midwife

## 2017-05-05 ENCOUNTER — Other Ambulatory Visit: Payer: Self-pay | Admitting: Obstetrics and Gynecology

## 2017-05-05 DIAGNOSIS — O219 Vomiting of pregnancy, unspecified: Secondary | ICD-10-CM

## 2017-06-23 ENCOUNTER — Ambulatory Visit (HOSPITAL_COMMUNITY)
Admission: RE | Admit: 2017-06-23 | Discharge: 2017-06-23 | Disposition: A | Discharge status: Home / Self Care | Payer: BLUE CROSS/BLUE SHIELD | Source: Ambulatory Visit | Attending: Certified Nurse Midwife | Admitting: Certified Nurse Midwife

## 2017-06-23 ENCOUNTER — Ambulatory Visit (INDEPENDENT_AMBULATORY_CARE_PROVIDER_SITE_OTHER): Payer: BLUE CROSS/BLUE SHIELD | Admitting: Certified Nurse Midwife

## 2017-06-23 ENCOUNTER — Other Ambulatory Visit: Payer: Self-pay | Admitting: Certified Nurse Midwife

## 2017-06-23 ENCOUNTER — Encounter (HOSPITAL_COMMUNITY): Payer: Self-pay

## 2017-06-23 VITALS — BP 108/75 | HR 101 | Wt 122.6 lb

## 2017-06-23 DIAGNOSIS — O09523 Supervision of elderly multigravida, third trimester: Secondary | ICD-10-CM | POA: Diagnosis not present

## 2017-06-23 DIAGNOSIS — Z363 Encounter for antenatal screening for malformations: Secondary | ICD-10-CM | POA: Insufficient documentation

## 2017-06-23 DIAGNOSIS — O0933 Supervision of pregnancy with insufficient antenatal care, third trimester: Secondary | ICD-10-CM | POA: Insufficient documentation

## 2017-06-23 DIAGNOSIS — O34219 Maternal care for unspecified type scar from previous cesarean delivery: Secondary | ICD-10-CM

## 2017-06-23 DIAGNOSIS — O0990 Supervision of high risk pregnancy, unspecified, unspecified trimester: Secondary | ICD-10-CM

## 2017-06-23 DIAGNOSIS — O09529 Supervision of elderly multigravida, unspecified trimester: Secondary | ICD-10-CM

## 2017-06-23 DIAGNOSIS — Z3A32 32 weeks gestation of pregnancy: Secondary | ICD-10-CM

## 2017-06-23 DIAGNOSIS — O0993 Supervision of high risk pregnancy, unspecified, third trimester: Secondary | ICD-10-CM

## 2017-06-23 NOTE — Progress Notes (Signed)
   PRENATAL VISIT NOTE  Subjective:  Gabriela Le is a 41 y.o. [phone] at 6558w3d being seen today for ongoing prenatal care.  She is currently monitored for the following issues for this high-risk pregnancy and has HSV-1 infection; Female circumcision; Elderly multigravida with antepartum condition or complication; Previous cesarean delivery, antepartum condition or complication; Hyperemesis; GERD (gastroesophageal reflux disease); Alopecia; Right ankle sprain; Vitamin D deficiency; Vitamin B12 deficiency; Supervision of high risk pregnancy, antepartum; Advanced maternal age in multigravida, unspecified trimester; Non-English speaking patient; and Late prenatal care in third trimester on her problem list.  Patient reports no complaints.  Contractions: Not present. Vag. Bleeding: None.  Movement: Present. Denies leaking of fluid.   The following portions of the patient's history were reviewed and updated as appropriate: allergies, current medications, past family history, past medical history, past social history, past surgical history and problem list. Problem list updated.  Objective:   Vitals:   06/23/17 1603  BP: 108/75  Pulse: (!) 101  Weight: 122 lb 9.6 oz (55.6 kg)    Fetal Status: Fetal Heart Rate (bpm): 140 Fundal Height: 27 cm Movement: Present     General:  Alert, oriented and cooperative. Patient is in no acute distress.  Skin: Skin is warm and dry. No rash noted.   Cardiovascular: Normal heart rate noted  Respiratory: Normal respiratory effort, no problems with respiration noted  Abdomen: Soft, gravid, appropriate for gestational age. Pain/Pressure: Absent     Pelvic:  Cervical exam deferred        Extremities: Normal range of motion.  Edema: None  Mental Status: Normal mood and affect. Normal behavior. Normal judgment and thought content.   Assessment and Plan:  Pregnancy: [id number] at 3658w3d  1. Supervision of high risk pregnancy, antepartum     [place] of country until 32.3  weeks. Had anatomy US today.  Message sent to schedule repeat C-section at 39 weeks per LMP.  Size < Dates.  Will need to review US results. OGTT next HOB.   2. Late prenatal care in third trimester     2nd prenatal visit today.   Preterm labor symptoms and general obstetric precautions including but not limited to vaginal bleeding, contractions, leaking of fluid and fetal movement were reviewed in detail with the patient. Please refer to After Visit Summary for other counseling recommendations.  [facility] (around [facility]30/2017[facility], Needs to see FP MD here, Bi-weekly Antenatal testing.   Roe Coombsachelle A Cece Milhouse, CNM

## 2017-06-24 ENCOUNTER — Encounter (HOSPITAL_COMMUNITY): Payer: Self-pay

## 2017-06-24 ENCOUNTER — Other Ambulatory Visit: Payer: Self-pay | Admitting: Certified Nurse Midwife

## 2017-06-24 DIAGNOSIS — O0990 Supervision of high risk pregnancy, unspecified, unspecified trimester: Secondary | ICD-10-CM

## 2017-06-26 ENCOUNTER — Emergency Department (HOSPITAL_COMMUNITY): Payer: BLUE CROSS/BLUE SHIELD

## 2017-06-26 ENCOUNTER — Emergency Department (HOSPITAL_COMMUNITY)
Admission: EM | Admit: 2017-06-26 | Discharge: 2017-06-26 | Disposition: A | Discharge status: Home / Self Care | Payer: BLUE CROSS/BLUE SHIELD | Attending: Emergency Medicine | Admitting: Emergency Medicine

## 2017-06-26 ENCOUNTER — Encounter (HOSPITAL_COMMUNITY): Payer: Self-pay | Admitting: Emergency Medicine

## 2017-06-26 DIAGNOSIS — R0789 Other chest pain: Secondary | ICD-10-CM | POA: Diagnosis not present

## 2017-06-26 DIAGNOSIS — M26622 Arthralgia of left temporomandibular joint: Secondary | ICD-10-CM | POA: Insufficient documentation

## 2017-06-26 DIAGNOSIS — Z79899 Other long term (current) drug therapy: Secondary | ICD-10-CM | POA: Insufficient documentation

## 2017-06-26 DIAGNOSIS — M542 Cervicalgia: Secondary | ICD-10-CM | POA: Insufficient documentation

## 2017-06-26 LAB — BASIC METABOLIC PANEL
ANION GAP: 6 (ref 5–15)
BUN: <5 mg/dL — ABNORMAL LOW (ref 6–20)
CALCIUM: 8.4 mg/dL — AB (ref 8.9–10.3)
CO2: 23 mmol/L (ref 22–32)
CREATININE: 0.41 mg/dL — AB (ref 0.44–1.00)
Chloride: 106 mmol/L (ref 101–111)
GLUCOSE: 79 mg/dL (ref 65–99)
Potassium: 3.9 mmol/L (ref 3.5–5.1)
Sodium: 135 mmol/L (ref 135–145)

## 2017-06-26 LAB — CBC WITH DIFFERENTIAL/PLATELET
BASOS ABS: 0 10*3/uL (ref 0.0–0.1)
BASOS PCT: 0 %
EOS PCT: 0 %
Eosinophils Absolute: 0 10*3/uL (ref 0.0–0.7)
HEMATOCRIT: 31.1 % — AB (ref 36.0–46.0)
Hemoglobin: 10.1 g/dL — ABNORMAL LOW (ref 12.0–15.0)
Lymphocytes Relative: 21 %
Lymphs Abs: 1.9 10*3/uL (ref 0.7–4.0)
MCH: 28.9 pg (ref 26.0–34.0)
MCHC: 32.5 g/dL (ref 30.0–36.0)
MCV: 89.1 fL (ref 78.0–100.0)
MONO ABS: 0.5 10*3/uL (ref 0.1–1.0)
MONOS PCT: 6 %
NEUTROS ABS: 6.6 10*3/uL (ref 1.7–7.7)
Neutrophils Relative %: 73 %
PLATELETS: 143 10*3/uL — AB (ref 150–400)
RBC: 3.49 MIL/uL — ABNORMAL LOW (ref 3.87–5.11)
RDW: 14.5 % (ref 11.5–15.5)
WBC: 9.1 10*3/uL (ref 4.0–10.5)

## 2017-06-26 LAB — I-STAT TROPONIN, ED: Troponin i, poc: 0.01 ng/mL (ref 0.00–0.08)

## 2017-06-26 NOTE — Progress Notes (Signed)
Spoke with Dr. Shawnie PonsPratt. Pt is a G4P2 at 32 6/[redacted] weeks gestation with c/o left sided chest pain that radiates to her L.  Shoulder. FHR is a category 1 tracing with occasional ui and uc's. No vaginal bleeding or leaking of fluid. Pt has had 1 miscarriage and 2 prior C/S. 1st one for breech and the 2nd for ? Placenta previa. Pt is planning a C/S for delivery of this baby. I did notice that her pllateleys are 144 today and 4 months ago, they were 244. Bloodpressures are nl. I talked with the PA and she feels that her chest pain is muscular. They plan to D/C the pt home. Pt is OB cleared. ED staff notified.

## 2017-06-26 NOTE — ED Provider Notes (Signed)
[facility]EMER[facility]ENCY DEPT Provider Note   CSN: [id number] Arrival date & time: 06/26/17  1106     History   Chief Complaint Chief Complaint  Patient presents with  . Chest Pain    HPI Gabriela Le is a 41 y.o. female.  HPI   Patient is a 41 year old female with no pertinent past medical history who presents to the ED with complaint of chest pain. Patient reports she is to [redacted] weeks pregnant. Patient states around 10 AM when she was sitting at home she began to have sharp stabbing pain to the left side of her chest that radiated into her left upper back and left side of her neck. She reports the pain remained constant until it resolved when she received aspirin upon arrival to the ED. She currently denies having any chest pain but only reports having sharp tingling pain to her left upper back/neck. Reports pain is aggravated with palpation, denies any alleviating factors. Denies any recent fall, trauma or injury. Patient reports during that episode of chest pain had mild shortness of breath due to worsening chest pain when taking a deep breath. Denies fever, headache, lightheadedness, dizziness, cough, hemoptysis, wheezing, heart palpitations, abdominal pain, nausea, vomiting, diarrhea, urinary symptoms, numbness, weakness, leg swelling, syncope. Patient denies taking any medications at home for her symptoms. Patient reports recently flying back home a week ago after traveling overseas for 4 months. Denies any complications with her current pregnancy. Denies history of cardiac disease. Denies smoking. [id number]. Patient reports she was recently seen by her OB 3 days ago for normal checkup.  OBGYN- Valentina Lucks (midwife)  Past Medical History:  Diagnosis Date  . GERD (gastroesophageal reflux disease)   . H/O cold sores    ACYCLOVIR OINTMENT PRN  . Infertility associated with anovulation    HAS TAKEN CLOMID IN THE PAST  . Irregular menses   . Missed ab 12/29/2010   no surgery required  . Tubal  disease 05/21/2011    Patient Active Problem List   Diagnosis Date Noted  . Late prenatal care in third trimester 06/23/2017  . Supervision of high risk pregnancy, antepartum 02/15/2017  . Advanced maternal age in multigravida, unspecified trimester 02/15/2017  . Non-English speaking patient 02/15/2017  . Vitamin D deficiency 08/05/2016  . Vitamin B12 deficiency 08/05/2016  . Right ankle sprain 07/17/2016  . GERD (gastroesophageal reflux disease) 02/12/2016  . Alopecia 02/12/2016  . Elderly multigravida with antepartum condition or complication 10/06/2014  . Previous cesarean delivery, antepartum condition or complication 10/06/2014  . Hyperemesis 10/06/2014  . 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 APPENDECTOMY     6 YOA  . CESAREAN SECTION  01/06/2013   Procedure: CESAREAN SECTION;  Surgeon: Hal Morales, MD;  Location: [facility];  Service: Obstetrics;  Laterality: N/A;  Primary  . CESAREAN SECTION N/A 10/09/2014   Procedure: CESAREAN SECTION;  Surgeon: Konrad Felix, MD;  Location: [facility];  Service: Obstetrics;  Laterality: N/A;  . CHOLECYSTECTOMY N/A 11/27/2014   Procedure: LAPAROSCOPIC CHOLECYSTECTOMY WITH INTRAOPERATIVE CHOLANGIOGRAM;  Surgeon: Chevis Pretty III, MD;  Location: [facility];  Service: General;  Laterality: N/A;  . CYSTECTOMY      OB History    Gravida Para Term Preterm AB Living   4 2 2   1 2    SAB TAB Ectopic Multiple Live Births   1 0     2  Home Medications    Prior to Admission medications   Medication Sig Start Date End Date Taking? Authorizing Provider  acyclovir ointment (ZOVIRAX) 5 % Apply 1 application topically 2 (two) times daily. 02/15/17  Yes Denney, Rachelle A, CNM  Doxylamine-Pyridoxine (DICLEGIS) 10-10 MG TBEC Take 1 tablet with breakfast and lunch.  Take 2 tablets at bedtime. 02/15/17  Yes Denney, Rachelle A, CNM  glycopyrrolate (ROBINUL) 1 MG tablet Take 1  tablet (1 mg total) by mouth 3 (three) times daily. 04/07/17  Yes Denney, Rachelle A, CNM  ondansetron (ZOFRAN-ODT) 8 MG disintegrating tablet DISSOLVE 1 TABLET ON TOP OF TONGUE EVERY 8 HOURS AS NEEDED FOR NAUSEA OR VOMITING 05/08/17  Yes Montez MoritaLawson, Marie D, CNM  Prenat-FeAsp-Meth-FA-DHA w/o A (PRENATE PIXIE) 10-0.6-0.4-200 MG CAPS Take 1 tablet by mouth daily. 02/15/17  Yes Denney, Rachelle A, CNM  ranitidine (ZANTAC) 150 MG capsule Take 1 capsule (150 mg total) by mouth 2 (two) times daily. 04/23/17  Yes Plotnikov, Georgina QuintAleksei V, MD    Family History Family History  Problem Relation Age of Onset  . Heart disease Mother        PALPITATIONS  . Hypertension Mother     Social History Social History  Substance Use Topics  . Smoking status: Never Smoker  . Smokeless tobacco: Never Used  . Alcohol use No     Allergies   Patient has no known allergies.   Review of Systems Review of Systems  Respiratory: Positive for shortness of breath.   Cardiovascular: Positive for chest pain.  Musculoskeletal: Positive for arthralgias (left shoulder) and neck pain.  All other systems reviewed and are negative.    Physical Exam Updated Vital Signs BP 111/77   Pulse 94   Temp 98.7 F (37.1 C) (Oral)   Resp (!) 25   Ht 5' (1.524 m)   Wt 55.3 kg (122 lb)   LMP 11/08/2016   SpO2 100%   BMI 23.83 kg/m   Physical Exam  Constitutional: She is oriented to person, place, and time. She appears well-developed and well-nourished. No distress.  HENT:  Head: Normocephalic and atraumatic.  Mouth/Throat: Oropharynx is clear and moist. No oropharyngeal exudate.  Eyes: Conjunctivae and EOM are normal. Pupils are equal, round, and reactive to light. Right eye exhibits no discharge. Left eye exhibits no discharge. No scleral icterus.  Neck: Normal range of motion. Neck supple.  Cardiovascular: Normal rate, regular rhythm, normal heart sounds and intact distal pulses.   Pulmonary/Chest: Effort normal and breath  sounds normal. No respiratory distress. She has no wheezes. She has no rales. She exhibits no tenderness.  Abdominal: Soft. She exhibits no distension and no mass. There is no tenderness. There is no rebound and no guarding.  Gravid abdomen  Musculoskeletal: Normal range of motion. She exhibits tenderness. She exhibits no edema or deformity.  No midline C, T, or L tenderness. Mild TTP over left upper trapezius. Full range of motion of neck and back. Full range of motion of bilateral upper and lower extremities, with 5/5 strength. Sensation intact. 2+ radial and PT pulses. Cap refill <2 seconds.   Neurological: She is alert and oriented to person, place, and time.  Skin: Skin is warm and dry. She is not diaphoretic.  Nursing note and vitals reviewed.    ED Treatments / Results  Labs (all labs ordered are listed, but only abnormal results are displayed) Labs Reviewed  CBC WITH DIFFERENTIAL/PLATELET - Abnormal; Notable for the following:       Result  Value   RBC 3.49 (*)    Hemoglobin 10.1 (*)    HCT 31.1 (*)    Platelets 143 (*)    All other components within normal limits  BASIC METABOLIC PANEL - Abnormal; Notable for the following:    BUN <5 (*)    Creatinine, Ser 0.41 (*)    Calcium 8.4 (*)    All other components within normal limits  I-STAT TROPOININ, ED    EKG  EKG Interpretation None       Radiology Dg Chest 2 View  Result Date: 06/26/2017 CLINICAL DATA:  Midsternal left shoulder chest pain. EXAM: CHEST  2 VIEW COMPARISON:  11/27/2014 FINDINGS: The cardiac silhouette is markedly enlarged, compared to patient's prior radiograph. Mediastinal contours appear intact. There is no evidence of focal airspace consolidation, pleural effusion or pneumothorax. Osseous structures are without acute abnormality. Soft tissues are grossly normal. IMPRESSION: Markedly enlarged cardiac silhouette, when compared to patient's prior radiograph. This may represent cardiomegaly, or be secondary  to patient's gravid state. Cardiology consultation may be considered. Low lung volumes, with exaggeration of the interstitium. Electronically Signed   By: Ted Mcalpine M.D.   On: 06/26/2017 12:56    Procedures Procedures (including critical care time)  Medications Ordered in ED Medications - No data to display   Initial Impression / Assessment and Plan / ED Course  I have reviewed the triage vital signs and the nursing notes.  Pertinent labs & imaging results that were available during my care of the patient were reviewed by me and considered in my medical decision making (see chart for details).     Patient presents with left-sided chest pain radiating around to her left upper back/neck started earlier this morning. Reports having mild intermittent shortness of breath due to her chest pain. Chest pain resolved after taking aspirin prior to arrival. Patient is [redacted] weeks pregnant, denies any complications with her pregnancy, reports being seen by her OB 3 days ago. She also reports recently flying back home 1 week ago after traveling overseas. VSS. Exam revealed mild tenderness over left upper trapezius and left cervical paraspinal muscles. Remaining exam unremarkable. Bilateral upper and lower extremities neurovascularly intact. Gravid abdomen, nontender. EKg showed sinus rhythm with no acute ischemic changes. Trop negative. Labs unremarkable. CXR negative. Discussed pt with Dr. Juleen China who also evaluated pt in the ED. Suspect pt's pain is likely musculoskeletal in etiology. I have a low suspicion for ACS, dissection, or other acute cardiac event at this time. Although pt with recent flight a week ago, do not suspect PE. Pt is CP free in the ED without hypoxia or tachycardia, no leg swelling. Pt evaluated by OB nurse, reports OB cleared. Plan to d/c pt home with symptomatic tx of pain with tylenol and cryotherapy. Discussed results, plan for d/c and outpatient follow up with pt. Discussed return  precautions.     Final Clinical Impressions(s) / ED Diagnoses   Final diagnoses:  Atypical chest pain    New Prescriptions New Prescriptions   No medications on file     Barrett Henle, Cordelia Poche 06/26/17 1452    Raeford Razor, MD 07/04/17 [phone]

## 2017-06-26 NOTE — ED Triage Notes (Signed)
Per EMS: pt from home c/o mid sternal to left shoulder CP started today that is intermittent in nature; pt denies SOB; pt is 8 months pregnant at present; pt have been out of country x 4 months returning Sunday; IV 20g L hand; pt given 324mg  ASA

## 2017-06-26 NOTE — ED Notes (Signed)
edp at bedside discussing benefits and risks of x ray

## 2017-06-26 NOTE — Discharge Instructions (Signed)
Take Tylenol as prescribed over-the-counter as needed for pain relief. You may also apply ice to affected area for 15-20 minutes 3-4 times daily for additional relief. I recommend refrain from doing any heavy lifting or strenuous activity with your arms for the next week until her symptoms have improved. Follow-up with your primary care provider within the next week if your symptoms have not resolved. I recommend following up with your OB/GYN at your scheduled appointment in 2 weeks. Please return to the Emergency Department if symptoms worsen or new onset of fever, headache, lightheadedness/dizziness, cough, coughing up blood, shortness of breath, chest pain, heart palpitations, abdominal pain, vomiting, vaginal bleeding, numbness, weakness, syncope.

## 2017-06-26 NOTE — ED Notes (Signed)
Rapid ob paged

## 2017-06-26 NOTE — ED Notes (Signed)
Patient transported to X-ray 

## 2017-06-26 NOTE — Progress Notes (Signed)
Pt is a G4P2 at 32 6/[redacted] weeks gestation. She has had 1 miscarriage and 2 previous C/S deliveries. 1st C/S was for breech and the other for placenta previa. She plans a repeat C/S for this delivery. Denies vaginal bleeding or leaking of fluid. [facility] Denies any problems with this pregnancy. She is here today because she woke up around 1000 this morning with pain on the left side of her chest that radiated into her shoulder. Denied nausea or diaphoresis with the pain. Said she did not feel dizzy or have a rapid heart rate with this pain.Denies any cardiac hx.

## 2017-06-29 ENCOUNTER — Encounter: Payer: BLUE CROSS/BLUE SHIELD | Admitting: Obstetrics and Gynecology

## 2017-07-02 ENCOUNTER — Other Ambulatory Visit: Payer: BLUE CROSS/BLUE SHIELD

## 2017-07-03 LAB — CBC
Hematocrit: 34.5 % (ref 34.0–46.6)
Hemoglobin: 10.8 g/dL — ABNORMAL LOW (ref 11.1–15.9)
MCH: 27.4 pg (ref 26.6–33.0)
MCHC: 31.3 g/dL — ABNORMAL LOW (ref 31.5–35.7)
MCV: 88 fL (ref 79–97)
PLATELETS: 159 10*3/uL (ref 150–379)
RBC: 3.94 x10E6/uL (ref 3.77–5.28)
RDW: 15.2 % (ref 12.3–15.4)
WBC: 8.7 10*3/uL (ref 3.4–10.8)

## 2017-07-03 LAB — RPR: RPR Ser Ql: NONREACTIVE

## 2017-07-03 LAB — HIV ANTIBODY (ROUTINE TESTING W REFLEX): HIV SCREEN 4TH GENERATION: NONREACTIVE

## 2017-07-03 LAB — HEMOGLOBIN A1C
Est. average glucose Bld gHb Est-mCnc: 108 mg/dL
HEMOGLOBIN A1C: 5.4 % (ref 4.8–5.6)

## 2017-07-03 LAB — GLUCOSE, FASTING: Glucose, Plasma: 77 mg/dL (ref 65–99)

## 2017-07-05 ENCOUNTER — Other Ambulatory Visit: Payer: Self-pay | Admitting: Certified Nurse Midwife

## 2017-07-05 DIAGNOSIS — O99013 Anemia complicating pregnancy, third trimester: Secondary | ICD-10-CM

## 2017-07-05 MED ORDER — CITRANATAL BLOOM 90-1 MG PO TABS: 1.0000 | ORAL_TABLET | Freq: Every day | ORAL | 12 refills | Status: DC

## 2017-07-06 ENCOUNTER — Encounter: Payer: Self-pay | Admitting: *Deleted

## 2017-07-07 ENCOUNTER — Other Ambulatory Visit: Payer: Self-pay | Admitting: Certified Nurse Midwife

## 2017-07-08 ENCOUNTER — Encounter: Payer: BLUE CROSS/BLUE SHIELD | Admitting: Obstetrics & Gynecology

## 2017-07-09 ENCOUNTER — Encounter: Payer: BLUE CROSS/BLUE SHIELD | Admitting: Certified Nurse Midwife

## 2017-07-09 ENCOUNTER — Other Ambulatory Visit: Payer: BLUE CROSS/BLUE SHIELD

## 2017-07-12 ENCOUNTER — Ambulatory Visit (INDEPENDENT_AMBULATORY_CARE_PROVIDER_SITE_OTHER): Payer: BLUE CROSS/BLUE SHIELD | Admitting: Certified Nurse Midwife

## 2017-07-12 ENCOUNTER — Other Ambulatory Visit (HOSPITAL_COMMUNITY)
Admission: RE | Admit: 2017-07-12 | Discharge: 2017-07-12 | Disposition: A | Discharge status: Home / Self Care | Payer: BLUE CROSS/BLUE SHIELD | Source: Ambulatory Visit | Attending: Obstetrics & Gynecology | Admitting: Obstetrics & Gynecology

## 2017-07-12 VITALS — BP 113/73 | HR 87 | Wt 126.6 lb

## 2017-07-12 DIAGNOSIS — O0990 Supervision of high risk pregnancy, unspecified, unspecified trimester: Secondary | ICD-10-CM

## 2017-07-12 DIAGNOSIS — O09523 Supervision of elderly multigravida, third trimester: Secondary | ICD-10-CM

## 2017-07-12 DIAGNOSIS — O0993 Supervision of high risk pregnancy, unspecified, third trimester: Secondary | ICD-10-CM

## 2017-07-12 DIAGNOSIS — O34219 Maternal care for unspecified type scar from previous cesarean delivery: Secondary | ICD-10-CM

## 2017-07-12 DIAGNOSIS — O09529 Supervision of elderly multigravida, unspecified trimester: Secondary | ICD-10-CM

## 2017-07-12 LAB — OB RESULTS CONSOLE GBS: STREP GROUP B AG: NEGATIVE

## 2017-07-12 MED ORDER — PNV PRENATAL PLUS MULTIVITAMIN 27-1 MG PO TABS: 1.0000 | ORAL_TABLET | Freq: Every day | ORAL | 11 refills | Status: DC

## 2017-07-12 NOTE — Progress Notes (Signed)
Patient reports good fetal movement, and occasional pressure. 

## 2017-07-12 NOTE — Progress Notes (Signed)
Subjective:  Gabriela Le is a 41 y.o. [phone] at 1143w1d being seen today for ongoing prenatal care.  She is currently monitored for the following issues for this high-risk pregnancy and has HSV-1 infection; Female circumcision; Elderly multigravida with antepartum condition or complication; Previous cesarean delivery, antepartum condition or complication; Supervision of high risk pregnancy, antepartum; Advanced maternal age in multigravida, unspecified trimester; Non-English speaking patient; and Late prenatal care in third trimester on her problem list.  Patient reports constipation.  Contractions: Not present. Vag. Bleeding: None.  Movement: Present. Denies leaking of fluid.   The following portions of the patient's history were reviewed and updated as appropriate: allergies, current medications, past family history, past medical history, past social history, past surgical history and problem list. Problem list updated.  Objective:   Vitals:   07/12/17 1625  BP: 113/73  Pulse: 87  Weight: 126 lb 9.6 oz (57.4 kg)    Fetal Status:     Movement: Present     General:  Alert, oriented and cooperative. Patient is in no acute distress.  Skin: Skin is warm and dry. No rash noted.   Cardiovascular: Normal heart rate noted  Respiratory: Normal respiratory effort, no problems with respiration noted  Abdomen: Soft, gravid, appropriate for gestational age. Pain/Pressure: Present     Pelvic: Vag. Bleeding: None     Cervical exam deferred        Extremities: Normal range of motion.  Edema: None  Mental Status: Normal mood and affect. Normal behavior. Normal judgment and thought content.   Urinalysis:      Assessment and Plan:  Pregnancy: [id number] at 7443w1d  1. Supervision of high risk pregnancy, antepartum - Cervicovaginal ancillary only - Strep Gp B NAA  2. Previous cesarean delivery, antepartum condition or complication - repeat scheduled for 08/08/17 - pt prefers different day, [place]  3. Elderly multigravida with antepartum condition or complication - start twice weekly AT next week - US MFM OB FOLLOW UP; Future - previous US at 3832 wk> nml AFV and growth 51 %ile  Preterm labor symptoms and general obstetric precautions including but not limited to vaginal bleeding, contractions, leaking of fluid and fetal movement were reviewed in detail with the patient. Please refer to After Visit Summary for other counseling recommendations.  Return in about 1 week (around 07/19/2017) for ROB with NST.   Donette LarryBhambri, Luise Yamamoto, CNM

## 2017-07-13 LAB — CERVICOVAGINAL ANCILLARY ONLY
Bacterial vaginitis: NEGATIVE
CANDIDA VAGINITIS: NEGATIVE
CHLAMYDIA, DNA PROBE: NEGATIVE
Neisseria Gonorrhea: NEGATIVE
Trichomonas: NEGATIVE

## 2017-07-14 LAB — STREP GP B NAA: STREP GROUP B AG: NEGATIVE

## 2017-07-19 ENCOUNTER — Other Ambulatory Visit (INDEPENDENT_AMBULATORY_CARE_PROVIDER_SITE_OTHER): Payer: BLUE CROSS/BLUE SHIELD

## 2017-07-19 ENCOUNTER — Ambulatory Visit (INDEPENDENT_AMBULATORY_CARE_PROVIDER_SITE_OTHER): Payer: BLUE CROSS/BLUE SHIELD

## 2017-07-19 ENCOUNTER — Other Ambulatory Visit: Payer: Self-pay | Admitting: Obstetrics and Gynecology

## 2017-07-19 ENCOUNTER — Other Ambulatory Visit: Payer: Self-pay

## 2017-07-19 VITALS — BP 99/69 | HR 98 | Wt 128.3 lb

## 2017-07-19 DIAGNOSIS — O09529 Supervision of elderly multigravida, unspecified trimester: Secondary | ICD-10-CM

## 2017-07-19 DIAGNOSIS — O09523 Supervision of elderly multigravida, third trimester: Secondary | ICD-10-CM

## 2017-07-19 DIAGNOSIS — Z331 Pregnant state, incidental: Secondary | ICD-10-CM

## 2017-07-19 DIAGNOSIS — Z113 Encounter for screening for infections with a predominantly sexual mode of transmission: Secondary | ICD-10-CM

## 2017-07-19 DIAGNOSIS — O0990 Supervision of high risk pregnancy, unspecified, unspecified trimester: Secondary | ICD-10-CM

## 2017-07-19 DIAGNOSIS — O0933 Supervision of pregnancy with insufficient antenatal care, third trimester: Secondary | ICD-10-CM

## 2017-07-19 DIAGNOSIS — O0993 Supervision of high risk pregnancy, unspecified, third trimester: Secondary | ICD-10-CM

## 2017-07-19 DIAGNOSIS — O34219 Maternal care for unspecified type scar from previous cesarean delivery: Secondary | ICD-10-CM

## 2017-07-19 NOTE — Progress Notes (Signed)
   PRENATAL VISIT NOTE  Subjective:  Gabriela Le is a 41 y.o. [phone] at 2888w1d being seen today for ongoing prenatal care.  She is currently monitored for the following issues for this high-risk pregnancy and has HSV-1 infection; Female circumcision; Elderly multigravida with antepartum condition or complication; Previous cesarean delivery, antepartum condition or complication; Supervision of high risk pregnancy, antepartum; Advanced maternal age in multigravida, unspecified trimester; Non-English speaking patient; and Late prenatal care in third trimester on her problem list.  Patient reports no complaints.  Contractions: Not present. Vag. Bleeding: None.  Movement: Present. Denies leaking of fluid.   The following portions of the patient's history were reviewed and updated as appropriate: allergies, current medications, past family history, past medical history, past social history, past surgical history and problem list. Problem list updated.  Objective:   Vitals:   07/19/17 1546  BP: 99/69  Pulse: 98  Weight: 128 lb 4.8 oz (58.2 kg)    Fetal Status: Fetal Heart Rate (bpm): 150 Fundal Height: 36 cm Movement: Present  Presentation: Vertex  General:  Alert, oriented and cooperative. Patient is in no acute distress.  Skin: Skin is warm and dry. No rash noted.   Cardiovascular: Normal heart rate noted  Respiratory: Normal respiratory effort, no problems with respiration noted  Abdomen: Soft, gravid, appropriate for gestational age.  Pain/Pressure: Absent     Pelvic: Cervical exam deferred        Extremities: Normal range of motion.  Edema: None  Mental Status:  Normal mood and affect. Normal behavior. Normal judgment and thought content.   Assessment and Plan:  Pregnancy: [id number] at 9888w1d  1. Supervision of high risk pregnancy, antepartum Patient is doing well without complaints  2. Elderly multigravida with antepartum condition or complication NST reviewed and reactive with  baseline 150, mod variability, +accels, no decels Pt informed that the ultrasound is considered a limited OB ultrasound and is not intended to be a complete ultrasound exam.  Patient also informed that the ultrasound is not being completed with the intent of assessing for fetal or placental anomalies or any pelvic abnormalities.  Explained that the purpose of today's ultrasound is to assess for  presentation and AFI with fetus in vertex position and AFI 9.  Patient acknowledges the purpose of the exam and the limitations of the study.    - US OB Limited; Future - Fetal nonstress test  3. Late prenatal care in third trimester   4. Advanced maternal age in multigravida, unspecified trimester Normal NIPS Follow up growth ultrasound already scheduled  5. Previous cesarean delivery, antepartum condition or complication Scheduled for repeat on 8/12 but patient is hoping for 8/13. Message sent to change surgery date if possible  Preterm labor symptoms and general obstetric precautions including but not limited to vaginal bleeding, contractions, leaking of fluid and fetal movement were reviewed in detail with the patient. Please refer to After Visit Summary for other counseling recommendations.  No Follow-up on file.   Catalina AntiguaPeggy Jayd Forrey, MD

## 2017-07-20 ENCOUNTER — Encounter: Payer: BLUE CROSS/BLUE SHIELD | Admitting: Obstetrics and Gynecology

## 2017-07-20 ENCOUNTER — Other Ambulatory Visit: Payer: BLUE CROSS/BLUE SHIELD

## 2017-07-22 ENCOUNTER — Ambulatory Visit (INDEPENDENT_AMBULATORY_CARE_PROVIDER_SITE_OTHER): Payer: BLUE CROSS/BLUE SHIELD | Admitting: Obstetrics and Gynecology

## 2017-07-22 ENCOUNTER — Ambulatory Visit (HOSPITAL_COMMUNITY)
Admission: RE | Admit: 2017-07-22 | Discharge: 2017-07-22 | Disposition: A | Discharge status: Home / Self Care | Payer: BLUE CROSS/BLUE SHIELD | Source: Ambulatory Visit | Attending: Certified Nurse Midwife | Admitting: Certified Nurse Midwife

## 2017-07-22 VITALS — Wt 130.0 lb

## 2017-07-22 DIAGNOSIS — O0993 Supervision of high risk pregnancy, unspecified, third trimester: Secondary | ICD-10-CM

## 2017-07-22 DIAGNOSIS — O0990 Supervision of high risk pregnancy, unspecified, unspecified trimester: Secondary | ICD-10-CM

## 2017-07-22 DIAGNOSIS — O09529 Supervision of elderly multigravida, unspecified trimester: Secondary | ICD-10-CM | POA: Diagnosis present

## 2017-07-22 DIAGNOSIS — O09523 Supervision of elderly multigravida, third trimester: Secondary | ICD-10-CM

## 2017-07-22 DIAGNOSIS — Z3A36 36 weeks gestation of pregnancy: Secondary | ICD-10-CM | POA: Diagnosis not present

## 2017-07-22 NOTE — Progress Notes (Signed)
7/26 NST reviewed and reactive with baseline 150, mod variability, +accels, no decels

## 2017-07-25 ENCOUNTER — Other Ambulatory Visit: Payer: Self-pay | Admitting: Obstetrics and Gynecology

## 2017-07-29 ENCOUNTER — Ambulatory Visit (INDEPENDENT_AMBULATORY_CARE_PROVIDER_SITE_OTHER): Payer: BLUE CROSS/BLUE SHIELD | Admitting: Certified Nurse Midwife

## 2017-07-29 VITALS — BP 119/80 | HR 93 | Wt 128.7 lb

## 2017-07-29 DIAGNOSIS — O09523 Supervision of elderly multigravida, third trimester: Secondary | ICD-10-CM | POA: Diagnosis not present

## 2017-07-29 DIAGNOSIS — O0993 Supervision of high risk pregnancy, unspecified, third trimester: Secondary | ICD-10-CM

## 2017-07-29 DIAGNOSIS — Z789 Other specified health status: Secondary | ICD-10-CM

## 2017-07-29 DIAGNOSIS — O09529 Supervision of elderly multigravida, unspecified trimester: Secondary | ICD-10-CM

## 2017-07-29 DIAGNOSIS — O0990 Supervision of high risk pregnancy, unspecified, unspecified trimester: Secondary | ICD-10-CM

## 2017-07-29 NOTE — Progress Notes (Signed)
   PRENATAL VISIT NOTE  Subjective:  Gabriela Le is a 41 y.o. [phone] at 3244w4d being seen today for ongoing prenatal care.  She is currently monitored for the following issues for this high-risk pregnancy and has HSV-1 infection; Female circumcision; Elderly multigravida with antepartum condition or complication; Previous cesarean delivery, antepartum condition or complication; Supervision of high risk pregnancy, antepartum; Advanced maternal age in multigravida, unspecified trimester; Non-English speaking patient; and Late prenatal care in third trimester on her problem list.  Patient reports no complaints.  Contractions: Not present. Vag. Bleeding: None.  Movement: Present. Denies leaking of fluid.   The following portions of the patient's history were reviewed and updated as appropriate: allergies, current medications, past family history, past medical history, past social history, past surgical history and problem list. Problem list updated.  Objective:   Vitals:   07/29/17 1626  BP: 119/80  Pulse: 93  Weight: 128 lb 11.2 oz (58.4 kg)    Fetal Status:     Movement: Present     General:  Alert, oriented and cooperative. Patient is in no acute distress.  Skin: Skin is warm and dry. No rash noted.   Cardiovascular: Normal heart rate noted  Respiratory: Normal respiratory effort, no problems with respiration noted  Abdomen: Soft, gravid, appropriate for gestational age.  Pain/Pressure: Present     Pelvic: Cervical exam deferred        Extremities: Normal range of motion.  Edema: None  Mental Status:  Normal mood and affect. Normal behavior. Normal judgment and thought content.  NST: + accels, no decels, moderate variability, Cat. 1 tracing. No contractions on toco.   Assessment and Plan:  Pregnancy: [id number] at 5944w4d  1. Supervision of high risk pregnancy, antepartum      Has repeat C-section scheduled for 08/09/17.  Reactive NST  2. Advanced maternal age in multigravida,  unspecified trimester     Last US: 07/22/17: normal    3. Non-English speaking patient      Term labor symptoms and general obstetric precautions including but not limited to vaginal bleeding, contractions, leaking of fluid and fetal movement were reviewed in detail with the patient. Please refer to After Visit Summary for other counseling recommendations.  Return in about 1 week (around 08/05/2017) [facility].   Roe Coombsachelle A Dosia Yodice, CNM

## 2017-07-29 NOTE — Progress Notes (Signed)
Patient reports good fetal movement, and reports ocassional back and leg pain, denies contractions.

## 2017-07-30 NOTE — Pre-Procedure Instructions (Signed)
Interpreter number [id number] 

## 2017-08-02 ENCOUNTER — Ambulatory Visit (INDEPENDENT_AMBULATORY_CARE_PROVIDER_SITE_OTHER): Payer: BLUE CROSS/BLUE SHIELD | Admitting: Obstetrics and Gynecology

## 2017-08-02 ENCOUNTER — Telehealth (HOSPITAL_COMMUNITY): Payer: Self-pay | Admitting: *Deleted

## 2017-08-02 DIAGNOSIS — O09529 Supervision of elderly multigravida, unspecified trimester: Secondary | ICD-10-CM

## 2017-08-02 DIAGNOSIS — O09523 Supervision of elderly multigravida, third trimester: Secondary | ICD-10-CM

## 2017-08-02 NOTE — Progress Notes (Signed)
8/6 NST reviewed and reactive with baseline 150, mod variability, +accels, no decels

## 2017-08-02 NOTE — Pre-Procedure Instructions (Signed)
interpreter number [phone]

## 2017-08-02 NOTE — Progress Notes (Signed)
Pt here for NST 

## 2017-08-02 NOTE — Telephone Encounter (Signed)
Preadmission screen  

## 2017-08-03 ENCOUNTER — Other Ambulatory Visit: Payer: BLUE CROSS/BLUE SHIELD

## 2017-08-03 ENCOUNTER — Encounter (HOSPITAL_COMMUNITY): Payer: Self-pay

## 2017-08-03 NOTE — Pre-Procedure Instructions (Signed)
Interpreter number [phone]

## 2017-08-03 NOTE — Pre-Procedure Instructions (Signed)
Pt is unable to come in for PAT visit on Friday.  Pt is gven instructions for NPO after midnight and arrival of 0915 on 08/09/2017.

## 2017-08-05 ENCOUNTER — Ambulatory Visit (INDEPENDENT_AMBULATORY_CARE_PROVIDER_SITE_OTHER): Payer: BLUE CROSS/BLUE SHIELD | Admitting: Obstetrics and Gynecology

## 2017-08-05 ENCOUNTER — Other Ambulatory Visit: Payer: BLUE CROSS/BLUE SHIELD

## 2017-08-05 VITALS — BP 122/82 | HR 84 | Wt 129.0 lb

## 2017-08-05 DIAGNOSIS — O09529 Supervision of elderly multigravida, unspecified trimester: Secondary | ICD-10-CM

## 2017-08-05 DIAGNOSIS — O0993 Supervision of high risk pregnancy, unspecified, third trimester: Secondary | ICD-10-CM

## 2017-08-05 DIAGNOSIS — O09523 Supervision of elderly multigravida, third trimester: Secondary | ICD-10-CM

## 2017-08-05 DIAGNOSIS — O34219 Maternal care for unspecified type scar from previous cesarean delivery: Secondary | ICD-10-CM

## 2017-08-05 DIAGNOSIS — O0990 Supervision of high risk pregnancy, unspecified, unspecified trimester: Secondary | ICD-10-CM

## 2017-08-05 NOTE — Progress Notes (Signed)
Subjective:  Gabriela Le is a 41 y.o. [phone] at 4644w4d being seen today for ongoing prenatal care.  She is currently monitored for the following issues for this high-risk pregnancy and has HSV-1 infection; Female circumcision; Elderly multigravida with antepartum condition or complication; Previous cesarean delivery, antepartum condition or complication; Supervision of high risk pregnancy, antepartum; Advanced maternal age in multigravida, unspecified trimester; Non-English speaking patient; and Late prenatal care in third trimester on her problem list.  Patient reports no complaints.  Contractions: Not present. Vag. Bleeding: None.  Movement: Present. Denies leaking of fluid.   The following portions of the patient's history were reviewed and updated as appropriate: allergies, current medications, past family history, past medical history, past social history, past surgical history and problem list. Problem list updated.  Objective:   Vitals:   08/05/17 1635  BP: 122/82  Pulse: 84  Weight: 129 lb (58.5 kg)    Fetal Status: Fetal Heart Rate (bpm): AFI/NST   Movement: Present  Presentation: Vertex  General:  Alert, oriented and cooperative. Patient is in no acute distress.  Skin: Skin is warm and dry. No rash noted.   Cardiovascular: Normal heart rate noted  Respiratory: Normal respiratory effort, no problems with respiration noted  Abdomen: Soft, gravid, appropriate for gestational age. Pain/Pressure: Absent     Pelvic:  Cervical exam deferred        Extremities: Normal range of motion.  Edema: None  Mental Status: Normal mood and affect. Normal behavior. Normal judgment and thought content.   Urinalysis:      Assessment and Plan:  Pregnancy: [id number] at 6744w4d  1. Supervision of high risk pregnancy, antepartum Stable  2. Elderly multigravida with antepartum condition or complication Reactive NST and normal AFI - Fetal nonstress test - US OB Limited; Future  3. Previous  cesarean delivery, antepartum condition or complication For repeat on Monday  4. Advanced maternal age in multigravida, unspecified trimester stable  Term labor symptoms and general obstetric precautions including but not limited to vaginal bleeding, contractions, leaking of fluid and fetal movement were reviewed in detail with the patient. Please refer to After Visit Summary for other counseling recommendations.  No Follow-up on file.   Hermina StaggersErvin, Michael L, MD

## 2017-08-05 NOTE — Patient Instructions (Signed)
Cesarean Delivery °Cesarean birth, or cesarean delivery, is the surgical delivery of a baby through an incision in the abdomen and the uterus. This may be referred to as a C-section. This procedure may be scheduled ahead of time, or it may be done in an emergency situation. °Tell a health care provider about: °· Any allergies you have. °· All medicines you are taking, including vitamins, herbs, eye drops, creams, and over-the-counter medicines. °· Any problems you or family members have had with anesthetic medicines. °· Any blood disorders you have. °· Any surgeries you have had. °· Any medical conditions you have. °· Whether you or any members of your family have a history of deep vein thrombosis (DVT) or pulmonary embolism (PE). °What are the risks? °Generally, this is a safe procedure. However, problems may occur, including: °· Infection. °· Bleeding. °· Allergic reactions to medicines. °· Damage to other structures or organs. °· Blood clots. °· Injury to your baby. ° °What happens before the procedure? °· Follow instructions from your health care provider about eating or drinking restrictions. °· Follow instructions from your health care provider about bathing before your procedure to help reduce your risk of infection. °· If you know that you are going to have a cesarean delivery, do not shave your pubic area. Shaving before the procedure may increase your risk of infection. °· Ask your health care provider about: °? Changing or stopping your regular medicines. This is especially important if you are taking diabetes medicines or blood thinners. °? Your pain management plan. This is especially important if you plan to breastfeed your baby. °? How long you will be in the hospital after the procedure. °? Any concerns you may have about receiving blood products if you need them during the procedure. °? Cord blood banking, if you plan to collect your baby’s umbilical cord blood. °· You may also want to ask your  health care provider: °? Whether you will be able to hold or breastfeed your baby while you are still in the operating room. °? Whether your baby can stay with you immediately after the procedure and during your recovery. °? Whether a family member or a person of your choice can go with you into the operating room and stay with you during the procedure, immediately after the procedure, and during your recovery. °· Plan to have someone drive you home when you are discharged from the hospital. °What happens during the procedure? °· Fetal monitors will be placed on your abdomen to monitor your heart rate and your baby's heart rate. °· Depending on the reason for your cesarean delivery, you may have a physical exam or additional testing, such as an ultrasound. °· An IV tube will be inserted into one of your veins. °· You may have your blood or urine tested. °· You will be given antibiotic medicine to help prevent infection. °· You may be given a special warming gown to wear to keep your temperature stable. °· Hair may be removed from your pubic area. °· The skin of your pubic area and lower abdomen will be cleaned with a germ-killing solution (antiseptic). °· A catheter may be inserted into your bladder through your urethra. This drains your urine during the procedure. °· You may be given one or more of the following: °? A medicine to numb the area (local anesthetic). °? A medicine to make you fall asleep (general anesthetic). °? A medicine (regional anesthetic) that is injected into your back or through a small   thin tube placed in your back (spinal anesthetic or epidural anesthetic). This numbs everything below the injection site and allows you to stay awake during your procedure. If this makes you feel nauseous, tell your health care provider. Medicines will be available to help reduce any nausea you may feel. °· An incision will be made in your abdomen, and then in your uterus. °· If you are awake during your  procedure, you may feel tugging and pulling in your abdomen, but you should not feel pain. If you feel pain, tell your health care provider immediately. °· Your baby will be removed from your uterus. You may feel more pressure or pushing while this happens. °· Immediately after birth, your baby will be dried and kept warm. You may be able to hold and breastfeed your baby. The umbilical cord may be clamped and cut during this time. °· Your placenta will be removed from your uterus. °· Your incisions will be closed with stitches (sutures). Staples, skin glue, or adhesive strips may also be applied to the incision in your abdomen. °· Bandages (dressings) will be placed over the incision in your abdomen. °The procedure may vary among health care providers and hospitals. °What happens after the procedure? °· Your blood pressure, heart rate, breathing rate, and blood oxygen level will be monitored often until the medicines you were given have worn off. °· You may continue to receive fluids and medicines through an IV tube. °· You will have some pain. Medicines will be available to help control your pain. °· To help prevent blood clots: °? You may be given medicines. °? You may have to wear compression stockings or devices. °? You will be encouraged to walk around when you are able. °· Hospital staff will encourage and support bonding with your baby. Your hospital may allow you and your baby to stay in the same room (rooming in) during your hospital stay to encourage successful breastfeeding. °· You may be encouraged to cough and breathe deeply often. This helps to prevent lung problems. °· If you have a catheter draining your urine, it will be removed as soon as possible after your procedure. °This information is not intended to replace advice given to you by your health care provider. Make sure you discuss any questions you have with your health care provider. °Document Released: 12/14/2005 Document Revised: 05/21/2016  Document Reviewed: 09/24/2015 °Elsevier Interactive Patient Education © 2017 [employer] ° °

## 2017-08-05 NOTE — Progress Notes (Signed)
Pt requests Valtrex for cold sores.

## 2017-08-06 ENCOUNTER — Encounter (HOSPITAL_COMMUNITY)
Admission: RE | Admit: 2017-08-06 | Discharge: 2017-08-06 | Disposition: A | Discharge status: Home / Self Care | Payer: BLUE CROSS/BLUE SHIELD | Source: Ambulatory Visit

## 2017-08-09 ENCOUNTER — Inpatient Hospital Stay (HOSPITAL_COMMUNITY)
Admission: RE | Admit: 2017-08-09 | Discharge: 2017-08-12 | DRG: 766 | Disposition: A | Discharge status: Home / Self Care | Payer: BLUE CROSS/BLUE SHIELD | Source: Ambulatory Visit | Attending: Obstetrics & Gynecology | Admitting: Obstetrics & Gynecology

## 2017-08-09 ENCOUNTER — Encounter (HOSPITAL_COMMUNITY)
Admission: RE | Disposition: A | Discharge status: Home / Self Care | Payer: Self-pay | Source: Ambulatory Visit | Attending: Obstetrics & Gynecology

## 2017-08-09 ENCOUNTER — Inpatient Hospital Stay (HOSPITAL_COMMUNITY): Payer: BLUE CROSS/BLUE SHIELD | Admitting: Anesthesiology

## 2017-08-09 ENCOUNTER — Encounter (HOSPITAL_COMMUNITY): Payer: Self-pay | Admitting: *Deleted

## 2017-08-09 DIAGNOSIS — N90810 Female genital mutilation status, unspecified: Secondary | ICD-10-CM | POA: Diagnosis present

## 2017-08-09 DIAGNOSIS — K219 Gastro-esophageal reflux disease without esophagitis: Secondary | ICD-10-CM | POA: Diagnosis present

## 2017-08-09 DIAGNOSIS — Z3A39 39 weeks gestation of pregnancy: Secondary | ICD-10-CM | POA: Diagnosis not present

## 2017-08-09 DIAGNOSIS — O3483 Maternal care for other abnormalities of pelvic organs, third trimester: Secondary | ICD-10-CM | POA: Diagnosis present

## 2017-08-09 DIAGNOSIS — O34211 Maternal care for low transverse scar from previous cesarean delivery: Principal | ICD-10-CM | POA: Diagnosis present

## 2017-08-09 DIAGNOSIS — O9962 Diseases of the digestive system complicating childbirth: Secondary | ICD-10-CM | POA: Diagnosis present

## 2017-08-09 DIAGNOSIS — Z98891 History of uterine scar from previous surgery: Secondary | ICD-10-CM

## 2017-08-09 DIAGNOSIS — Z9889 Other specified postprocedural states: Secondary | ICD-10-CM

## 2017-08-09 LAB — CBC
HEMATOCRIT: 35.9 % — AB (ref 36.0–46.0)
HEMOGLOBIN: 12 g/dL (ref 12.0–15.0)
MCH: 29.7 pg (ref 26.0–34.0)
MCHC: 33.4 g/dL (ref 30.0–36.0)
MCV: 88.9 fL (ref 78.0–100.0)
Platelets: 139 10*3/uL — ABNORMAL LOW (ref 150–400)
RBC: 4.04 MIL/uL (ref 3.87–5.11)
RDW: 16 % — AB (ref 11.5–15.5)
WBC: 8.8 10*3/uL (ref 4.0–10.5)

## 2017-08-09 LAB — TYPE AND SCREEN
ABO/RH(D): A POS
Antibody Screen: NEGATIVE

## 2017-08-09 SURGERY — Surgical Case
Anesthesia: Spinal

## 2017-08-09 MED ORDER — PHENYLEPHRINE 8 MG IN D5W 100 ML (0.08MG/ML) PREMIX OPTIME
INJECTION | INTRAVENOUS | Status: AC
  Filled 2017-08-09: qty 100

## 2017-08-09 MED ORDER — SENNOSIDES-DOCUSATE SODIUM 8.6-50 MG PO TABS
2.0000 | ORAL_TABLET | ORAL | Status: DC
Start: 2017-08-10 — End: 2017-08-12
  Administered 2017-08-10 – 2017-08-11 (×3): 2 via ORAL
  Filled 2017-08-09 (×3): qty 2

## 2017-08-09 MED ORDER — ZOLPIDEM TARTRATE 5 MG PO TABS: 5.0000 mg | ORAL_TABLET | Freq: Every evening | ORAL | Status: DC | PRN

## 2017-08-09 MED ORDER — NALBUPHINE HCL 10 MG/ML IJ SOLN: 5.0000 mg | INTRAMUSCULAR | Status: DC | PRN

## 2017-08-09 MED ORDER — SIMETHICONE 80 MG PO CHEW
80.0000 mg | CHEWABLE_TABLET | ORAL | Status: DC
  Administered 2017-08-10 – 2017-08-12 (×3): 80 mg via ORAL
  Filled 2017-08-09 (×3): qty 1

## 2017-08-09 MED ORDER — ONDANSETRON HCL 4 MG/2ML IJ SOLN
INTRAMUSCULAR | Status: DC | PRN
  Administered 2017-08-09: 4 mg via INTRAVENOUS

## 2017-08-09 MED ORDER — NALOXONE HCL 0.4 MG/ML IJ SOLN: 0.4000 mg | INTRAMUSCULAR | Status: DC | PRN

## 2017-08-09 MED ORDER — DIPHENHYDRAMINE HCL 25 MG PO CAPS: 25.0000 mg | ORAL_CAPSULE | Freq: Four times a day (QID) | ORAL | Status: DC | PRN

## 2017-08-09 MED ORDER — TETANUS-DIPHTH-ACELL PERTUSSIS 5-2.5-18.5 LF-MCG/0.5 IM SUSP
0.5000 mL | Freq: Once | INTRAMUSCULAR | Status: AC
  Administered 2017-08-11: 0.5 mL via INTRAMUSCULAR
  Filled 2017-08-09: qty 0.5

## 2017-08-09 MED ORDER — DIPHENHYDRAMINE HCL 50 MG/ML IJ SOLN
12.5000 mg | INTRAMUSCULAR | Status: DC | PRN
  Administered 2017-08-09 (×2): 12.5 mg via INTRAVENOUS
  Filled 2017-08-09: qty 1

## 2017-08-09 MED ORDER — DIBUCAINE 1 % RE OINT: 1.0000 "application " | TOPICAL_OINTMENT | RECTAL | Status: DC | PRN

## 2017-08-09 MED ORDER — NALBUPHINE HCL 10 MG/ML IJ SOLN: 5.0000 mg | Freq: Once | INTRAMUSCULAR | Status: AC | PRN

## 2017-08-09 MED ORDER — LACTATED RINGERS IV SOLN
INTRAVENOUS | Status: DC
  Administered 2017-08-09 – 2017-08-10 (×2): via INTRAVENOUS

## 2017-08-09 MED ORDER — PROMETHAZINE HCL 25 MG/ML IJ SOLN: 6.2500 mg | INTRAMUSCULAR | Status: DC | PRN

## 2017-08-09 MED ORDER — PRENATAL MULTIVITAMIN CH
1.0000 | ORAL_TABLET | Freq: Every day | ORAL | Status: DC
  Administered 2017-08-10 – 2017-08-12 (×3): 1 via ORAL
  Filled 2017-08-09 (×3): qty 1

## 2017-08-09 MED ORDER — LACTATED RINGERS IV SOLN
INTRAVENOUS | Status: DC
  Administered 2017-08-09: 125 mL via INTRAVENOUS
  Administered 2017-08-09 (×2): via INTRAVENOUS

## 2017-08-09 MED ORDER — DIPHENHYDRAMINE HCL 25 MG PO CAPS
25.0000 mg | ORAL_CAPSULE | ORAL | Status: DC | PRN
  Administered 2017-08-10: 25 mg via ORAL
  Filled 2017-08-09 (×2): qty 1

## 2017-08-09 MED ORDER — SCOPOLAMINE 1 MG/3DAYS TD PT72
MEDICATED_PATCH | TRANSDERMAL | Status: DC | PRN
  Administered 2017-08-09: 1 via TRANSDERMAL

## 2017-08-09 MED ORDER — IBUPROFEN 600 MG PO TABS
600.0000 mg | ORAL_TABLET | Freq: Four times a day (QID) | ORAL | Status: DC
  Administered 2017-08-09 – 2017-08-12 (×12): 600 mg via ORAL
  Filled 2017-08-09 (×12): qty 1

## 2017-08-09 MED ORDER — NALOXONE HCL 2 MG/2ML IJ SOSY
1.0000 ug/kg/h | PREFILLED_SYRINGE | INTRAMUSCULAR | Status: DC | PRN
  Filled 2017-08-09: qty 2

## 2017-08-09 MED ORDER — OXYTOCIN 40 UNITS IN LACTATED RINGERS INFUSION - SIMPLE MED: 2.5000 [IU]/h | INTRAVENOUS | Status: AC

## 2017-08-09 MED ORDER — SCOPOLAMINE 1 MG/3DAYS TD PT72
1.0000 | MEDICATED_PATCH | Freq: Once | TRANSDERMAL | Status: DC
  Filled 2017-08-09: qty 1

## 2017-08-09 MED ORDER — DEXAMETHASONE SODIUM PHOSPHATE 4 MG/ML IJ SOLN
INTRAMUSCULAR | Status: DC | PRN
  Administered 2017-08-09: 4 mg via INTRAVENOUS

## 2017-08-09 MED ORDER — WITCH HAZEL-GLYCERIN EX PADS: 1.0000 "application " | MEDICATED_PAD | CUTANEOUS | Status: DC | PRN

## 2017-08-09 MED ORDER — BUPIVACAINE IN DEXTROSE 0.75-8.25 % IT SOLN
INTRATHECAL | Status: DC | PRN
  Administered 2017-08-09: 1.5 mL via INTRATHECAL

## 2017-08-09 MED ORDER — NALBUPHINE HCL 10 MG/ML IJ SOLN
5.0000 mg | INTRAMUSCULAR | Status: DC | PRN
  Administered 2017-08-09: 5 mg via INTRAVENOUS
  Filled 2017-08-09: qty 1

## 2017-08-09 MED ORDER — SIMETHICONE 80 MG PO CHEW: 80.0000 mg | CHEWABLE_TABLET | ORAL | Status: DC | PRN

## 2017-08-09 MED ORDER — MEPERIDINE HCL 25 MG/ML IJ SOLN: 6.2500 mg | INTRAMUSCULAR | Status: DC | PRN

## 2017-08-09 MED ORDER — ONDANSETRON HCL 4 MG/2ML IJ SOLN: 4.0000 mg | Freq: Three times a day (TID) | INTRAMUSCULAR | Status: DC | PRN

## 2017-08-09 MED ORDER — FENTANYL CITRATE (PF) 100 MCG/2ML IJ SOLN
INTRAMUSCULAR | Status: AC
  Filled 2017-08-09: qty 2

## 2017-08-09 MED ORDER — OXYTOCIN 10 UNIT/ML IJ SOLN
INTRAVENOUS | Status: DC | PRN
  Administered 2017-08-09: 40 [IU] via INTRAVENOUS

## 2017-08-09 MED ORDER — OXYCODONE-ACETAMINOPHEN 5-325 MG PO TABS
2.0000 | ORAL_TABLET | ORAL | Status: DC | PRN
  Administered 2017-08-11: 2 via ORAL
  Filled 2017-08-09: qty 2

## 2017-08-09 MED ORDER — LACTATED RINGERS IV SOLN
INTRAVENOUS | Status: DC | PRN
  Administered 2017-08-09: 12:00:00 via INTRAVENOUS

## 2017-08-09 MED ORDER — DIPHENHYDRAMINE HCL 50 MG/ML IJ SOLN
INTRAMUSCULAR | Status: AC
  Filled 2017-08-09: qty 1

## 2017-08-09 MED ORDER — FENTANYL CITRATE (PF) 100 MCG/2ML IJ SOLN: 25.0000 ug | INTRAMUSCULAR | Status: DC | PRN

## 2017-08-09 MED ORDER — MORPHINE SULFATE (PF) 0.5 MG/ML IJ SOLN
INTRAMUSCULAR | Status: AC
  Filled 2017-08-09: qty 10

## 2017-08-09 MED ORDER — NALBUPHINE HCL 10 MG/ML IJ SOLN
5.0000 mg | Freq: Once | INTRAMUSCULAR | Status: AC | PRN
  Administered 2017-08-09: 5 mg via INTRAVENOUS

## 2017-08-09 MED ORDER — SCOPOLAMINE 1 MG/3DAYS TD PT72
MEDICATED_PATCH | TRANSDERMAL | Status: AC
Start: 2017-08-09 — End: ?
  Filled 2017-08-09: qty 1

## 2017-08-09 MED ORDER — SIMETHICONE 80 MG PO CHEW
80.0000 mg | CHEWABLE_TABLET | Freq: Three times a day (TID) | ORAL | Status: DC
  Administered 2017-08-09 – 2017-08-12 (×6): 80 mg via ORAL
  Filled 2017-08-09 (×7): qty 1

## 2017-08-09 MED ORDER — BUPIVACAINE HCL (PF) 0.5 % IJ SOLN
INTRAMUSCULAR | Status: DC | PRN
  Administered 2017-08-09: 30 mL

## 2017-08-09 MED ORDER — MEASLES, MUMPS & RUBELLA VAC ~~LOC~~ INJ
0.5000 mL | INJECTION | Freq: Once | SUBCUTANEOUS | Status: DC
  Filled 2017-08-09: qty 0.5

## 2017-08-09 MED ORDER — SODIUM CHLORIDE 0.9% FLUSH: 3.0000 mL | INTRAVENOUS | Status: DC | PRN

## 2017-08-09 MED ORDER — OXYCODONE-ACETAMINOPHEN 5-325 MG PO TABS
1.0000 | ORAL_TABLET | ORAL | Status: DC | PRN
  Administered 2017-08-10 – 2017-08-12 (×5): 1 via ORAL
  Filled 2017-08-09 (×5): qty 1

## 2017-08-09 MED ORDER — NALBUPHINE HCL 10 MG/ML IJ SOLN
INTRAMUSCULAR | Status: AC
  Filled 2017-08-09: qty 1

## 2017-08-09 MED ORDER — KETOROLAC TROMETHAMINE 30 MG/ML IJ SOLN: 30.0000 mg | Freq: Four times a day (QID) | INTRAMUSCULAR | Status: AC | PRN

## 2017-08-09 MED ORDER — FENTANYL CITRATE (PF) 100 MCG/2ML IJ SOLN
INTRAMUSCULAR | Status: DC | PRN
  Administered 2017-08-09: 10 ug via INTRATHECAL

## 2017-08-09 MED ORDER — COCONUT OIL OIL
1.0000 "application " | TOPICAL_OIL | Status: DC | PRN
  Administered 2017-08-10: 1 via TOPICAL
  Filled 2017-08-09: qty 120

## 2017-08-09 MED ORDER — BUPIVACAINE IN DEXTROSE 0.75-8.25 % IT SOLN
INTRATHECAL | Status: AC
  Filled 2017-08-09: qty 2

## 2017-08-09 MED ORDER — BUPIVACAINE HCL (PF) 0.5 % IJ SOLN
INTRAMUSCULAR | Status: AC
  Filled 2017-08-09: qty 30

## 2017-08-09 MED ORDER — LACTATED RINGERS IV SOLN: INTRAVENOUS | Status: DC

## 2017-08-09 MED ORDER — MENTHOL 3 MG MT LOZG
1.0000 | LOZENGE | OROMUCOSAL | Status: DC | PRN
Start: 2017-08-09 — End: 2017-08-12

## 2017-08-09 MED ORDER — MORPHINE SULFATE (PF) 0.5 MG/ML IJ SOLN
INTRAMUSCULAR | Status: DC | PRN
  Administered 2017-08-09: .2 mg via INTRATHECAL

## 2017-08-09 MED ORDER — PHENYLEPHRINE 8 MG IN D5W 100 ML (0.08MG/ML) PREMIX OPTIME
INJECTION | INTRAVENOUS | Status: DC | PRN
  Administered 2017-08-09: 60 ug/min via INTRAVENOUS

## 2017-08-09 MED ORDER — ONDANSETRON HCL 4 MG/2ML IJ SOLN
INTRAMUSCULAR | Status: AC
  Filled 2017-08-09: qty 2

## 2017-08-09 MED ORDER — DEXAMETHASONE SODIUM PHOSPHATE 4 MG/ML IJ SOLN
INTRAMUSCULAR | Status: AC
  Filled 2017-08-09: qty 1

## 2017-08-09 MED ORDER — ACETAMINOPHEN 325 MG PO TABS
650.0000 mg | ORAL_TABLET | ORAL | Status: DC | PRN
  Administered 2017-08-09 – 2017-08-10 (×2): 650 mg via ORAL
  Filled 2017-08-09 (×2): qty 2

## 2017-08-09 MED ORDER — CEFAZOLIN SODIUM-DEXTROSE 2-4 GM/100ML-% IV SOLN
2.0000 g | INTRAVENOUS | Status: AC
  Administered 2017-08-09: 2 g via INTRAVENOUS

## 2017-08-09 MED ORDER — OXYTOCIN 10 UNIT/ML IJ SOLN
INTRAMUSCULAR | Status: AC
Start: 2017-08-09 — End: ?
  Filled 2017-08-09: qty 4

## 2017-08-09 MED ORDER — MEPERIDINE HCL 25 MG/ML IJ SOLN
6.2500 mg | INTRAMUSCULAR | Status: DC | PRN
Start: 2017-08-09 — End: 2017-08-09

## 2017-08-09 SURGICAL SUPPLY — 35 items
BENZOIN TINCTURE PRP APPL 2/3 (GAUZE/BANDAGES/DRESSINGS) ×3 IMPLANT
BLADE TIP J-PLASMA PRECISE LAP (MISCELLANEOUS) ×3 IMPLANT
CHLORAPREP W/TINT 26ML (MISCELLANEOUS) ×3 IMPLANT
CLAMP CORD UMBIL (MISCELLANEOUS) IMPLANT
CLOSURE STERI STRIP 1/2 X4 (GAUZE/BANDAGES/DRESSINGS) ×3 IMPLANT
CLOTH BEACON ORANGE TIMEOUT ST (SAFETY) ×3 IMPLANT
DRSG OPSITE POSTOP 4X10 (GAUZE/BANDAGES/DRESSINGS) ×3 IMPLANT
ELECT REM PT RETURN 9FT ADLT (ELECTROSURGICAL) ×3
ELECTRODE REM PT RTRN 9FT ADLT (ELECTROSURGICAL) ×1 IMPLANT
EXTRACTOR VACUUM KIWI (MISCELLANEOUS) IMPLANT
GLOVE BIO SURGEON STRL SZ7 (GLOVE) ×3 IMPLANT
GLOVE BIOGEL PI IND STRL 7.0 (GLOVE) ×2 IMPLANT
GLOVE BIOGEL PI INDICATOR 7.0 (GLOVE) ×4
GOWN STRL REUS W/TWL LRG LVL3 (GOWN DISPOSABLE) ×6 IMPLANT
GOWN STRL REUS W/TWL XL LVL3 (GOWN DISPOSABLE) ×3 IMPLANT
KIT ABG SYR 3ML LUER SLIP (SYRINGE) IMPLANT
NEEDLE HYPO 22GX1.5 SAFETY (NEEDLE) ×3 IMPLANT
NEEDLE HYPO 25X5/8 SAFETYGLIDE (NEEDLE) IMPLANT
NS IRRIG 1000ML POUR BTL (IV SOLUTION) ×3 IMPLANT
PACK C SECTION WH (CUSTOM PROCEDURE TRAY) ×3 IMPLANT
PAD OB MATERNITY 4.3X12.25 (PERSONAL CARE ITEMS) ×3 IMPLANT
PENCIL SMOKE EVAC W/HOLSTER (ELECTROSURGICAL) ×3 IMPLANT
RTRCTR C-SECT PINK 25CM LRG (MISCELLANEOUS) IMPLANT
SPONGE SURGIFOAM ABS GEL 12-7 (HEMOSTASIS) IMPLANT
STRIP CLOSURE SKIN 1/2X4 (GAUZE/BANDAGES/DRESSINGS) ×2 IMPLANT
SUT PDS AB 0 CTX 60 (SUTURE) IMPLANT
SUT PLAIN 0 NONE (SUTURE) IMPLANT
SUT SILK 0 TIES 10X30 (SUTURE) IMPLANT
SUT VIC AB 0 CT1 36 (SUTURE) ×9 IMPLANT
SUT VIC AB 3-0 CT1 27 (SUTURE) ×2
SUT VIC AB 3-0 CT1 TAPERPNT 27 (SUTURE) ×1 IMPLANT
SUT VIC AB 4-0 KS 27 (SUTURE) IMPLANT
SYR CONTROL 10ML LL (SYRINGE) ×3 IMPLANT
TOWEL OR 17X24 6PK STRL BLUE (TOWEL DISPOSABLE) ×3 IMPLANT
TRAY FOLEY BAG SILVER LF 14FR (SET/KITS/TRAYS/PACK) ×3 IMPLANT

## 2017-08-09 NOTE — Anesthesia Postprocedure Evaluation (Signed)
Anesthesia Post Note  Patient: Talitha GivensMona O Lienemann  Procedure(s) Performed: Procedure(s) (LRB): REPEAT CESAREAN SECTION (N/A)     Patient location during evaluation: PACU Anesthesia Type: Spinal Level of consciousness: oriented and awake and alert Pain management: pain level controlled Vital Signs Assessment: post-procedure vital signs reviewed and stable Respiratory status: spontaneous breathing, respiratory function stable and patient connected to nasal cannula oxygen Cardiovascular status: blood pressure returned to baseline and stable Postop Assessment: no headache, no backache, spinal receding and patient able to bend at knees Anesthetic complications: no    Last Vitals:  Vitals:   08/09/17 1445 08/09/17 1450  BP: 117/75   Pulse: 70 73  Resp: 18 15  Temp: (!) 36.3 C   SpO2: 100% 100%    Last Pain:  Vitals:   08/09/17 1445  TempSrc: Oral  PainSc: (P) 0-No pain   Pain Goal: Patients Stated Pain Goal: 4 (08/09/17 1400)               Shelton SilvasKevin D Giuseppina Quinones

## 2017-08-09 NOTE — Op Note (Signed)
08/09/2017  12:31 PM  PATIENT:  Gabriela Le  41 y.o. female  PRE-OPERATIVE DIAGNOSIS:  REPEAT CS  POST-OPERATIVE DIAGNOSIS:  REPEAT CS  PROCEDURE:  Procedure(s): REPEAT CESAREAN SECTION (N/A)  SURGEON:  Surgeon(s) and Role:    * Willodean RosenthalHarraway-Smith, Roxas Clymer, MD - Primary  ANESTHESIA:   spinal  EBL:  Total I/O In: 2300 [I.V.:2300] Out: 1003 [Urine:150; Blood:853]  BLOOD ADMINISTERED:none  DRAINS: none   LOCAL MEDICATIONS USED:  MARCAINE     SPECIMEN: placents  DISPOSITION OF SPECIMEN:  PATHOLOGY  COUNTS:  YES  TOURNIQUET:  * No tourniquets in log *  DICTATION: .Note written in EPIC  PLAN OF CARE: Admit to inpatient   PATIENT DISPOSITION:  PACU - hemodynamically stable.   Delay start of Pharmacological VTE agent (>24hrs) due to surgical blood loss or risk of bleeding: yes  Complications: none immediate  INDICATIONS: Gabriela Le is a 41 y.o. [phone] at 8975w1d here for cesarean section secondary to the indications listed under preoperative diagnosis; please see preoperative note for further details.  The risks of cesarean section were discussed with the patient including but were not limited to: bleeding which may require transfusion or reoperation; infection which may require antibiotics; injury to bowel, bladder, ureters or other surrounding organs; injury to the fetus; need for additional procedures including hysterectomy in the event of a life-threatening hemorrhage; placental abnormalities wth subsequent pregnancies, incisional problems, thromboembolic phenomenon and other postoperative/anesthesia complications.   The patient concurred with the proposed plan, giving informed written consent for the procedure.     FINDINGS:  Viable female infant in cephalic presentation.  Apgars pending. Clear amniotic fluid.  Intact placenta, three vessel cord.  Normal uterus, fallopian tubes and ovaries bilaterally.  PROCEDURE IN DETAIL:  The patient preoperatively received  intravenous antibiotics and had sequential compression devices applied to her lower extremities.  She was then taken to the operating room where spinal anesthesia was administered and was found to be adequate. She was then placed in a dorsal supine position with a leftward tilt, and prepped and draped in a sterile manner.  A foley catheter was placed into her bladder and attached to constant gravity.  After an adequate timeout was performed, a Pfannenstiel skin incision was made with scalpel and carried through to the underlying layer of fascia. The fascia was incised in the midline, and this incision was extended bilaterally using the Mayo scissors.  Kocher clamps were applied to the superior aspect of the fascial incision and the underlying rectus muscles were dissected off bluntly. A similar process was carried out on the inferior aspect of the fascial incision. The rectus muscles were separated in the midline bluntly and the peritoneum was entered bluntly.  Attention was turned to the lower uterine segment where a low transverse hysterotomy incision was made with a scalpel and extended bilaterally bluntly.  The infant was successfully delivered, the cord was clamped and cut and the infant was handed over to awaiting neonatology team. The placenta was delivered manually. Uterine massage was then administered.  The placenta was intact with a three-vessel cord. The uterus was then cleared of clot and debris.  The hysterotomy was closed with 0 Vicryl in a running locked fashion, and an imbricating layer was also placed with the same suture. The uterus was returned to the pelvis. The pelvis was cleared of all clot and debris. Hemostasis was confirmed on all surfaces.  The peritoneum and the muscles were reapproximated using 0 Vicryl with 1 interrupted suture.  The fascia was then closed using 0 Vicryl.  The skin was closed with a 4-0 Vicryl subcuticular stitch.  30 cc of 0.5% marcaine was injected into the incision  and benzoin and steristrips were applied.  The patient tolerated the procedure well. Sponge, lap, instrument and needle counts were correct x 2.  She was taken to the recovery room in stable condition.   Almendra Loria L. Harraway-Smith, M.D., [facility] Core

## 2017-08-09 NOTE — Brief Op Note (Signed)
08/09/2017  12:31 PM  PATIENT:  Talitha GivensMona O Kunert  41 y.o. female  PRE-OPERATIVE DIAGNOSIS:  REPEAT CS  POST-OPERATIVE DIAGNOSIS:  REPEAT CS  PROCEDURE:  Procedure(s): REPEAT CESAREAN SECTION (N/A)  SURGEON:  Surgeon(s) and Role:    * Willodean RosenthalHarraway-Smith, Ryott Rafferty, MD - Primary  ANESTHESIA:   spinal  EBL:  Total I/O In: 2300 [I.V.:2300] Out: 1003 [Urine:150; Blood:853]  BLOOD ADMINISTERED:none  DRAINS: none   LOCAL MEDICATIONS USED:  MARCAINE     SPECIMEN: placents  DISPOSITION OF SPECIMEN:  PATHOLOGY  COUNTS:  YES  TOURNIQUET:  * No tourniquets in log *  DICTATION: .Note written in EPIC  PLAN OF CARE: Admit to inpatient   PATIENT DISPOSITION:  PACU - hemodynamically stable.   Delay start of Pharmacological VTE agent (>24hrs) due to surgical blood loss or risk of bleeding: yes  Complications: none immediate  Jackquelyn Sundberg L. Harraway-Smith, M.D., Evern CoreFACOG

## 2017-08-09 NOTE — Anesthesia Procedure Notes (Signed)
Spinal  Patient location during procedure: OR Start time: 08/09/2017 11:29 AM End time: 08/09/2017 11:31 AM Staffing Anesthesiologist: Suella Broad D Performed: anesthesiologist  Preanesthetic Checklist Completed: patient identified, site marked, surgical consent, pre-op evaluation, timeout performed, IV checked, risks and benefits discussed and monitors and equipment checked Spinal Block Patient position: sitting Prep: Betadine Patient monitoring: heart rate, continuous pulse ox, blood pressure and cardiac monitor Approach: midline Location: L4-5 Injection technique: single-shot Needle Needle type: Introducer and Pencan  Needle gauge: 24 G Needle length: 9 cm Additional Notes Negative paresthesia. Negative blood return. Positive free-flowing CSF. Expiration date of kit checked and confirmed. Patient tolerated procedure well, without complications.

## 2017-08-09 NOTE — Progress Notes (Signed)
Pt called RN to room stating her dressing was bleeding. Blood noted coming from the right side, under the pressure dressing. Honeycomb found to be saturated with blood also. RN obtained order to change dressing with pressure dressing applied. Dressing now clean, dry, intact.

## 2017-08-09 NOTE — Anesthesia Preprocedure Evaluation (Addendum)
Anesthesia Evaluation  Patient identified by MRN, date of birth, ID band Patient awake    Reviewed: Allergy & Precautions, NPO status , Patient's Chart, lab work & pertinent test results  Airway Mallampati: I  TM Distance: >3 FB Neck ROM: Limited    Dental  (+) Teeth Intact   Pulmonary neg pulmonary ROS,    Pulmonary exam normal        Cardiovascular negative cardio ROS Normal cardiovascular exam     Neuro/Psych negative neurological ROS     GI/Hepatic Neg liver ROS, GERD  ,  Endo/Other  negative endocrine ROS  Renal/GU negative Renal ROS     Musculoskeletal negative musculoskeletal ROS (+)   Abdominal   Peds  Hematology negative hematology ROS (+)   Anesthesia Other Findings Day of surgery medications reviewed with the patient.  Reproductive/Obstetrics (+) Pregnancy                            Anesthesia Physical Anesthesia Plan  ASA: II  Anesthesia Plan: Spinal   Post-op Pain Management:    Induction:   PONV Risk Score and Plan:   Airway Management Planned:   Additional Equipment:   Intra-op Plan:   Post-operative Plan:   Informed Consent: I have reviewed the patients History and Physical, chart, labs and discussed the procedure including the risks, benefits and alternatives for the proposed anesthesia with the patient or authorized representative who has indicated his/her understanding and acceptance.     Plan Discussed with:   Anesthesia Plan Comments:         Anesthesia Quick Evaluation

## 2017-08-09 NOTE — H&P (Signed)
Obstetric Preoperative History and Physical  Gabriela Le is a 41 y.o. [phone] with IUP at 2873w1d by LMP, and pregnancy complicated by AMA, HSV, and previous Cesarean Sections x2. presenting for scheduled cesarean section.  No acute concerns.   Prenatal Course Source of Care: [place]   Pregnancy complications or risks: Patient Active Problem List   Diagnosis Date Noted  . Late prenatal care in third trimester 06/23/2017  . Supervision of high risk pregnancy, antepartum 02/15/2017  . Advanced maternal age in multigravida, unspecified trimester 02/15/2017  . Non-English speaking patient 02/15/2017  . Elderly multigravida with antepartum condition or complication 10/06/2014  . Previous cesarean delivery, antepartum condition or complication 10/06/2014  . 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   Prenatal labs and studies: ABO, Rh: A/Positive/-- (02/19 1613) Antibody: Negative (02/19 1613) Rubella: 13.90 (02/19 1613) RPR: Non Reactive (07/06 0830)  HBsAg: Negative (02/19 1613)  HIV:   nonreactive AVW:UJWJXBJYGBS:Negative (07/16 1654) 1 hr Glucola normal at 14 weeks Genetic screening normal Anatomy US normal  Prenatal Transfer Tool  Maternal Diabetes: No Genetic Screening: Normal Maternal Ultrasounds/Referrals: Normal Fetal Ultrasounds or other Referrals:  None Maternal Substance Abuse:  No Significant Maternal Medications:  None Significant Maternal Lab Results: None  Past Medical History:  Diagnosis Date  . GERD (gastroesophageal reflux disease)   . H/O cold sores    ACYCLOVIR OINTMENT PRN  . Infertility associated with anovulation    HAS TAKEN CLOMID IN THE PAST  . Irregular menses   . Missed ab 12/29/2010   no surgery required  . 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 APPENDECTOMY     6 YOA  . CESAREAN SECTION  01/06/2013   Procedure:  CESAREAN SECTION;  Surgeon: Hal MoralesVanessa P Haygood, MD;  Location: [facility];  Service: Obstetrics;  Laterality: N/A;  Primary  . CESAREAN SECTION N/A 10/09/2014   Procedure: CESAREAN SECTION;  Surgeon: Konrad FelixEma Wakuru Kulwa, MD;  Location: [facility];  Service: Obstetrics;  Laterality: N/A;  . CHOLECYSTECTOMY N/A 11/27/2014   Procedure: LAPAROSCOPIC CHOLECYSTECTOMY WITH INTRAOPERATIVE CHOLANGIOGRAM;  Surgeon: Chevis PrettyPaul Toth III, MD;  Location: [facility];  Service: General;  Laterality: N/A;  . CYSTECTOMY      OB History  Gravida Para Term Preterm AB Living  4 2 2   1 2   SAB TAB Ectopic Multiple Live Births  1 0     2    # Outcome Date GA Lbr Len/2nd Weight Sex Delivery Anes PTL Lv  4 Current           3 Term 10/09/14 5036w2d  7 lb 3.7 oz (3.28 kg) M CS-LTranv Spinal  LIV  2 Term 01/06/13 275w2d  6 lb 2.8 oz (2.801 kg) F CS-LTranv Spinal  LIV     Birth Comments: birthmark under left nipple  1 SAB 12/29/10              Social History   Social History  . Marital status: Married    Spouse name: Gabriela Le  . Number of children: 0  . Years of education: 14   Occupational History  . STUDENT     [employer]   Social History Main Topics  . Smoking status: Never Smoker  . Smokeless tobacco: Never Used  . Alcohol use No  . Drug use: No  . Sexual activity: Yes    Partners: Male  Birth control/ protection: None     Comment: pregnant   Other Topics Concern  . None   Social History Narrative  . None    Family History  Problem Relation Age of Onset  . Heart disease Mother        PALPITATIONS  . Hypertension Mother     Prescriptions Prior to Admission  Medication Sig Dispense Refill Last Dose  . acetaminophen (TYLENOL) 325 MG tablet Take 325 mg by mouth daily as needed for moderate pain or headache.   Not Taking  . glycopyrrolate (ROBINUL) 1 MG tablet Take 1 tablet (1 mg total) by mouth 3 (three) times daily. 90 tablet 3 Taking  . Multiple Vitamin (DAILY VITAMINS PO) Take 1 tablet by mouth daily.      . ondansetron (ZOFRAN-ODT) 8 MG disintegrating tablet DISSOLVE 1 TABLET ON TOP OF TONGUE EVERY 8 HOURS AS NEEDED FOR NAUSEA OR VOMITING 20 tablet 0 Taking  . ranitidine (ZANTAC) 150 MG capsule Take 1 capsule (150 mg total) by mouth 2 (two) times daily. 60 capsule 3 Taking  . Prenatal Vit-Fe Fumarate-FA (PNV PRENATAL PLUS MULTIVITAMIN) 27-1 MG TABS Take 1 tablet by mouth daily. 30 tablet 11 Taking  . Prenatal-DSS-FeCb-FeGl-FA (CITRANATAL BLOOM) 90-1 MG TABS Take 1 tablet by mouth daily. 30 tablet 12 Taking    No Known Allergies  Review of Systems: Negative except for what is mentioned in HPI.  Physical Exam: BP 119/76   Pulse 81   Temp 98.6 F (37 C) (Oral)   Resp 20   Ht 4\' 11"  (1.499 m)   Wt 129 lb (58.5 kg)   LMP 11/08/2016   BMI 26.05 kg/m  FHR by Doppler: 132 bpm CONSTITUTIONAL: Well-developed, well-nourished female in no acute distress.  HENT:  Normocephalic, atraumatic. Oropharynx is clear and moist EYES: Conjunctivae and EOM are normal. No scleral icterus.  NECK: Normal range of motion, supple, no masses SKIN: Skin is warm and dry. No rash noted. Not diaphoretic. No erythema. No pallor. NEUROLGIC: Alert and oriented to person, place, and time. No cranial nerve deficit noted. PSYCHIATRIC: Normal mood and affect. Normal behavior. Normal judgment and thought content. CARDIOVASCULAR: Normal heart rate noted, regular rhythm RESPIRATORY: Effort and breath sounds normal, no problems with respiration noted ABDOMEN: Soft, nontender, nondistended, gravid. Well-healed Pfannenstiel incision. PELVIC: Deferred MUSCULOSKELETAL: Normal range of motion. No edema and no tenderness. 2+ distal pulses.   Pertinent Labs/Studies:   Results for orders placed or performed during the hospital encounter of 08/09/17 (from the past 72 hour(s))  CBC     Status: Abnormal   Collection Time: 08/09/17  9:55 AM  Result Value Ref Range   WBC 8.8 4.0 - 10.5 K/uL   RBC 4.04 3.87 - 5.11 MIL/uL    Hemoglobin 12.0 12.0 - 15.0 g/dL   HCT 40.9 (L) 81.1 - 91.4 %   MCV 88.9 78.0 - 100.0 fL   MCH 29.7 26.0 - 34.0 pg   MCHC 33.4 30.0 - 36.0 g/dL   RDW 78.2 (H) 95.6 - 21.3 %   Platelets 139 (L) 150 - 400 K/uL    Assessment and Plan :MARISELA LINE is a 41 y.o. [id number] at [redacted]w[redacted]d being admitted for scheduled cesarean section for repeat. The risks of cesarean section discussed with the patient included but were not limited to: bleeding which may require transfusion or reoperation; infection which may require antibiotics; injury to bowel, bladder, ureters or other surrounding organs; injury to the fetus; need for additional procedures including hysterectomy  in the event of a life-threatening hemorrhage; placental abnormalities wth subsequent pregnancies, incisional problems, thromboembolic phenomenon and other postoperative/anesthesia complications. The patient concurred with the proposed plan, giving informed written consent for the procedure. Patient has been NPO since last night she will remain NPO for procedure. Anesthesia and OR aware. Preoperative prophylactic antibiotics and SCDs ordered on call to the OR. To OR when ready.    Raynelle Fanning P. Gurney Balthazor, MD OB Fellow Faculty Practice, [facility]

## 2017-08-09 NOTE — Transfer of Care (Signed)
Immediate Anesthesia Transfer of Care Note  Patient: Gabriela GivensMona O Ottaviano  Procedure(s) Performed: Procedure(s): REPEAT CESAREAN SECTION (N/A)  Patient Location: PACU  Anesthesia Type:Spinal  Level of Consciousness: awake, alert  and oriented  Airway & Oxygen Therapy: Patient Spontanous Breathing  Post-op Assessment: Report given to RN and Post -op Vital signs reviewed and stable  Post vital signs: Reviewed and stable  Last Vitals:  Vitals:   08/09/17 0951  BP: 119/76  Pulse: 81  Resp: 20  Temp: 37 C    Last Pain:  Vitals:   08/09/17 0951  TempSrc: Oral         Complications: No apparent anesthesia complications

## 2017-08-10 ENCOUNTER — Encounter (HOSPITAL_COMMUNITY): Payer: Self-pay | Admitting: *Deleted

## 2017-08-10 LAB — CBC
HEMATOCRIT: 29.1 % — AB (ref 36.0–46.0)
Hemoglobin: 10 g/dL — ABNORMAL LOW (ref 12.0–15.0)
MCH: 30.1 pg (ref 26.0–34.0)
MCHC: 34.4 g/dL (ref 30.0–36.0)
MCV: 87.7 fL (ref 78.0–100.0)
PLATELETS: 124 10*3/uL — AB (ref 150–400)
RBC: 3.32 MIL/uL — ABNORMAL LOW (ref 3.87–5.11)
RDW: 15.6 % — AB (ref 11.5–15.5)
WBC: 11.8 10*3/uL — AB (ref 4.0–10.5)

## 2017-08-10 MED ORDER — FAMOTIDINE 20 MG PO TABS
20.0000 mg | ORAL_TABLET | Freq: Two times a day (BID) | ORAL | Status: DC
  Administered 2017-08-10 – 2017-08-11 (×4): 20 mg via ORAL
  Filled 2017-08-10 (×5): qty 1

## 2017-08-10 NOTE — Plan of Care (Signed)
Problem: Activity: Goal: Ability to tolerate increased activity will improve Outcome: Completed/Met Date Met: 08/10/17 Patient is up and ambulating in the room, she is not experiencing dizziness.   Problem: Nutritional: Goal: Dietary intake will improve Outcome: Completed/Met Date Met: 08/10/17 Patient is able to eat without nausea or vomiting and is resuming normal eating habits.  Problem: Urinary Elimination: Goal: Ability to reestablish a normal urinary elimination pattern will improve Outcome: Completed/Met Date Met: 08/10/17 Patient has been able to void adequately since catheter removal.  Problem: Activity: Goal: Risk for activity intolerance will decrease Outcome: Completed/Met Date Met: 08/10/17 Patient is ambulating in room frequently.

## 2017-08-10 NOTE — Addendum Note (Signed)
Addendum  created 08/10/17 0750 by Junious SilkGilbert, Chandler Swiderski, CRNA   Sign clinical note

## 2017-08-10 NOTE — Anesthesia Postprocedure Evaluation (Signed)
Anesthesia Post Note  Patient: Talitha GivensMona O Seeney  Procedure(s) Performed: Procedure(s) (LRB): REPEAT CESAREAN SECTION (N/A)     Patient location during evaluation: Mother Baby Anesthesia Type: Spinal Level of consciousness: oriented and awake and alert Pain management: pain level controlled Vital Signs Assessment: post-procedure vital signs reviewed and stable Respiratory status: spontaneous breathing and respiratory function stable Cardiovascular status: blood pressure returned to baseline and stable Postop Assessment: no headache and no backache Anesthetic complications: no    Last Vitals:  Vitals:   08/09/17 2237 08/10/17 0630  BP: 119/72 119/64  Pulse: 70 81  Resp: 18 18  Temp: 36.7 C 36.9 C  SpO2: 99% 99%    Last Pain:  Vitals:   08/10/17 0630  TempSrc: Oral  PainSc: 0-No pain   Pain Goal: Patients Stated Pain Goal: 4 (08/09/17 1734)               Junious SilkGILBERT,Darlen Gledhill

## 2017-08-10 NOTE — Lactation Note (Signed)
This note was copied from a baby's chart. Lactation Consultation Note: Mother understands English well. She has difficulty speaking english. Mother has two family members at the bedside. She reports that she is comfortable with ladies interpreting .  Mother reports that she breastfed her first child for one yr. She reports that she breastfed one month with the second. Mother had two surgical procedures after second child and she reports that her milk dried up while she was away from her infant. Mother plans to breastfeed this infant for 1-2 yrs.  Mother ask for suggests to make a good milk supply. Reviewed cue base feeding and frequent skin to skin. Mother advised to feed infant 8-12 times in 24 hours.  Mother had infant latched to the left breast in cradle position when I arrived in the room. Mother reports that infant is feeding well. Observed infant with strong suckling and frequent swallows. Mother confirms that she is able to hand express colostrum.  Reviewed basics of breastfeeding with mother. [facility] and informed of available services. Mother receptive to all teaching.   Patient Name: Gabriela Le ZOXWR'UToday's Date: 08/10/2017 Reason for consult: Initial assessment   Maternal Data Has patient been taught Hand Expression?: Yes Does the patient have breastfeeding experience prior to this delivery?: Yes  Feeding Feeding Type: Breast Fed (Simultaneous filing. User may not have seen previous data.) Length of feed: 10 min  LATCH Score Latch: Grasps breast easily, tongue down, lips flanged, rhythmical sucking.  Audible Swallowing: Spontaneous and intermittent  Type of Nipple: Everted at rest and after stimulation  Comfort (Breast/Nipple): Soft / non-tender  Hold (Positioning): Assistance needed to correctly position infant at breast and maintain latch.  LATCH Score: 9  Interventions Interventions: Breast feeding basics reviewed;Assisted with latch;Breast  compression;Adjust position;Support pillows;Position options  Lactation Tools Discussed/Used     Consult Status Consult Status: Follow-up Date: 08/11/17 Follow-up type: In-patient    Stevan BornKendrick, Shamel Germond [facility] 08/10/2017, 11:24 AM

## 2017-08-10 NOTE — Progress Notes (Signed)
Subjective: Postpartum Day 1: Cesarean Delivery Patient reports feeling well experiencing some heart burn. She has ambulated in the room. Her pain is well controlled with pain medication. She denies chest pain, SOB, lightheadedness/dizziness, nausea or emesis.    Objective: Vital signs in last 24 hours: Temp:  [97.4 F (36.3 C)-98.6 F (37 C)] 98 F (36.7 C) (08/13 2237) Pulse Rate:  [62-86] 70 (08/13 2237) Resp:  [10-22] 18 (08/13 2237) BP: (95-128)/(60-80) 119/72 (08/13 2237) SpO2:  [97 %-100 %] 99 % (08/13 2237) Weight:  [129 lb (58.5 kg)] 129 lb (58.5 kg) (08/13 0956)  Physical Exam:  General: alert, cooperative and no distress Lochia: appropriate Uterine Fundus: firm Incision: honeycomb dressing is clean/dry and intact DVT Evaluation: No evidence of DVT seen on physical exam.   Recent Labs  08/09/17 0955  HGB 12.0  HCT 35.9*    Assessment/Plan: Status post Cesarean section. Doing well postoperatively.  Continue current care Awaiting am cbc result.  Leanette Eutsler 08/10/2017, 6:18 AM

## 2017-08-11 NOTE — Progress Notes (Signed)
Subjective: Postpartum Day 2: Cesarean Delivery Patient reports feeling well experiencing some incisional pain. She has ambulated in the room. Her pain is well controlled with pain medication. She denies chest pain, SOB, lightheadedness/dizziness, nausea or emesis.  She denies any flatus. Tolerating PO.  Objective: Vital signs in last 24 hours: Temp:  [98.1 F (36.7 C)-98.5 F (36.9 C)] 98.4 F (36.9 C) (08/15 0517) Pulse Rate:  [72-77] 77 (08/15 0517) Resp:  [16-20] 20 (08/15 0517) BP: (102-109)/(60-62) 102/60 (08/15 0517) SpO2:  [100 %] 100 % (08/14 1115)  Physical Exam:  General: alert, cooperative and no distress Lochia: appropriate Uterine Fundus: firm Incision: honeycomb dressing is clean/dry and intact DVT Evaluation: No evidence of DVT seen on physical exam.   Recent Labs  08/09/17 0955 08/10/17 0524  HGB 12.0 10.0*  HCT 35.9* 29.1*    Assessment/Plan: Status post Cesarean section. Doing well postoperatively.  Continue current care Encourage ambulation Discharge tomorrow  Caryl AdaJazma Phelps, DO OB Fellow 08/11/2017, 8:03 AM

## 2017-08-11 NOTE — Progress Notes (Signed)
Patient ambulated halls.

## 2017-08-11 NOTE — Progress Notes (Signed)
Patient told about pain regimen if needed. Patient told to walk halls today. Patient states she isnt passing gas and that her body tends not to pass gas. Gas pills given this am. Audible bowel sounds. Patient told about her incentive spirometer and it was up in cabinet. I took it out and told patient she needed to use it. All questions answered. Plan of care told. Patient told to keep up with all feedings and pees and poops on yellow sheet today to help insure infant gets to go home. She verbalized understanding.

## 2017-08-12 DIAGNOSIS — Z98891 History of uterine scar from previous surgery: Secondary | ICD-10-CM

## 2017-08-12 LAB — RPR: RPR Ser Ql: NONREACTIVE

## 2017-08-12 MED ORDER — DIPHENHYDRAMINE HCL 25 MG PO CAPS: 25.0000 mg | ORAL_CAPSULE | ORAL | 0 refills | Status: DC | PRN

## 2017-08-12 MED ORDER — OXYCODONE-ACETAMINOPHEN 5-325 MG PO TABS: 1.0000 | ORAL_TABLET | Freq: Four times a day (QID) | ORAL | 0 refills | Status: DC | PRN

## 2017-08-12 MED ORDER — IBUPROFEN 600 MG PO TABS: 600.0000 mg | ORAL_TABLET | Freq: Four times a day (QID) | ORAL | 0 refills | Status: DC

## 2017-08-12 MED ORDER — SENNOSIDES-DOCUSATE SODIUM 8.6-50 MG PO TABS: 2.0000 | ORAL_TABLET | ORAL | 0 refills | Status: DC

## 2017-08-12 NOTE — Lactation Note (Signed)
This note was copied from a baby's chart. Lactation Consultation Note Mom's 3rd child. Mom stated baby "Reem" BF good. Mom had good communication, LC encouraged mom to ask for interpreter if needed during our consult. Mom stated OK she was good and understood for now.  Baby is latching good. Encouraged STS, mom said she was doing that, mom is telling I&O, mom expressed colostrum, mom has 724 1/41 yr old 813 yr old.  Encouraged mom to call for assistance or concerns.  Patient Name: Gabriela Le ZOXWR'UToday's Date: 08/12/2017 Reason for consult: Follow-up assessment   Maternal Data    Feeding Feeding Type: Breast Fed Length of feed: 20 min  LATCH Score       Type of Nipple: Everted at rest and after stimulation  Comfort (Breast/Nipple): Filling, red/small blisters or bruises, mild/mod discomfort        Interventions Interventions: Support pillows;Skin to skin;Expressed milk;Coconut oil;Hand express  Lactation Tools Discussed/Used Tools: Coconut oil   Consult Status Consult Status: Follow-up Date: 08/13/17 Follow-up type: In-patient    Charyl DancerCARVER, Aiman Noe G 08/12/2017, 6:55 AM

## 2017-08-12 NOTE — Discharge Summary (Signed)
OB Discharge Summary     Patient Name: Gabriela Le DOB: 11/18/1976 MRN: [id number]  Date of admission: 8/13/201Alesia Le Delivering MD: Gabriela RosenthalHARRAWAY-SMITH, Gabriela   Date of discharge: 08/12/2017  Admitting diagnosis: RCS Intrauterine pregnancy: 2568w1d     Secondary diagnosis:  Active Problems:   Post-operative state   Status post repeat low transverse cesarean section  Additional problems: None     Discharge diagnosis: Term Pregnancy Delivered                                                                                                Post partum procedures:none  Augmentation: none  Complications: None  Hospital course:  Sceduled C/S   41 y.o. yo [id number] at 3268w1d was admitted to the hospital 08/09/2017 for scheduled cesarean section with the following indication:Elective Repeat.  Membrane Rupture Time/Date: 11:53 AM ,08/09/2017   Patient delivered a Viable infant.08/09/2017  Details of operation can be found in separate operative note.  Pateint had an uncomplicated postpartum course.  She is ambulating, tolerating a regular diet, passing flatus, and urinating well. Patient is discharged home in stable condition on  08/12/17         Physical exam  Vitals:   08/10/17 1814 08/11/17 0517 08/11/17 1837 08/12/17 0517  BP: 109/61 102/60 119/71 126/72  Pulse: 73 77 87 91  Resp: 18 20 20    Temp: 98.1 F (36.7 C) 98.4 F (36.9 C) 98.7 F (37.1 C) 97.6 F (36.4 C)  TempSrc: Oral Oral Oral Oral  SpO2:      Weight:      Height:       General: alert, cooperative and no distress Lochia: appropriate Uterine Fundus: firm Incision: Dressing is clean, dry, and intact DVT Evaluation: No evidence of DVT seen on physical exam. Labs: Lab Results  Component Value Date   WBC 11.8 (H) 08/10/2017   HGB 10.0 (L) 08/10/2017   HCT 29.1 (L) 08/10/2017   MCV 87.7 08/10/2017   PLT 124 (L) 08/10/2017   CMP Latest Ref Rng & Units 06/26/2017  Glucose 65 - 99 mg/dL 79  BUN 6 - 20 mg/dL <2(N<5(L)   Creatinine 5.620.44 - 1.00 mg/dL 1.30(Q0.41(L)  Sodium 657135 - 846145 mmol/L 135  Potassium 3.5 - 5.1 mmol/L 3.9  Chloride 101 - 111 mmol/L 106  CO2 22 - 32 mmol/L 23  Calcium 8.9 - 10.3 mg/dL 9.6(E8.4(L)  Total Protein 6.0 - 8.3 g/dL -  Total Bilirubin 0.2 - 1.2 mg/dL -  Alkaline Phos 39 - 952117 U/L -  AST 0 - 37 U/L -  ALT 0 - 35 U/L -    Discharge instruction: per After Visit Summary and "Baby and Me Booklet".  After visit meds:  Allergies as of 08/12/2017   No Known Allergies     Medication List    STOP taking these medications   CITRANATAL BLOOM 90-1 MG Tabs   DAILY VITAMINS PO   glycopyrrolate 1 MG tablet Commonly known as:  ROBINUL     TAKE these medications   acetaminophen 325 MG tablet Commonly known as:  TYLENOL Take 325 mg  by mouth daily as needed for moderate pain or headache.   diphenhydrAMINE 25 mg capsule Commonly known as:  BENADRYL Take 1 capsule (25 mg total) by mouth every 4 (four) hours as needed (refractory itching).   ibuprofen 600 MG tablet Commonly known as:  ADVIL,MOTRIN Take 1 tablet (600 mg total) by mouth every 6 (six) hours.   ondansetron 8 MG disintegrating tablet Commonly known as:  ZOFRAN-ODT DISSOLVE 1 TABLET ON TOP OF TONGUE EVERY 8 HOURS AS NEEDED FOR NAUSEA OR VOMITING   oxyCODONE-acetaminophen 5-325 MG tablet Commonly known as:  PERCOCET/ROXICET Take 1 tablet by mouth every 6 (six) hours as needed for severe pain.   PNV PRENATAL PLUS MULTIVITAMIN 27-1 MG Tabs Take 1 tablet by mouth daily.   ranitidine 150 MG capsule Commonly known as:  ZANTAC Take 1 capsule (150 mg total) by mouth 2 (two) times daily.   senna-docusate 8.6-50 MG tablet Commonly known as:  Senokot-S Take 2 tablets by mouth daily.       Diet: routine diet  Activity: Advance as tolerated. Pelvic rest for 6 weeks.   Follow up Appt:Future Appointments Date Time Provider Department Center  09/06/2017 3:45 PM Le, Gabriela Gin, MD [facility] None   Postpartum contraception:  Declined  Newborn Data: Live born female  Birth Weight: 6 lb 7.5 oz (2935 g) APGAR: 9, 10  Baby Feeding: Breast Disposition:home with mother  Gabriela Ada, DO OB Fellow Faculty Practice, [facility] 08/12/2017, 9:14 AM

## 2017-08-16 ENCOUNTER — Encounter: Payer: Self-pay | Admitting: Obstetrics and Gynecology

## 2017-08-16 ENCOUNTER — Ambulatory Visit (INDEPENDENT_AMBULATORY_CARE_PROVIDER_SITE_OTHER): Payer: BLUE CROSS/BLUE SHIELD | Admitting: Obstetrics and Gynecology

## 2017-08-16 VITALS — BP 126/87 | HR 69

## 2017-08-16 DIAGNOSIS — Z5189 Encounter for other specified aftercare: Secondary | ICD-10-CM

## 2017-08-16 NOTE — Progress Notes (Signed)
41 yo [id number] s/p repeat cesarean section on 8/13 presenting today for a wound check. Patient reports feeling well with some incisional pain. She denies fever, chills, or abnormal drainage from her incision. Patient reports that pain is well controlled with ibuprofen.  Past Medical History:  Diagnosis Date  . GERD (gastroesophageal reflux disease)   . H/O cold sores    ACYCLOVIR OINTMENT PRN  . Infertility associated with anovulation    HAS TAKEN CLOMID IN THE PAST  . Irregular menses   . Missed ab 12/29/2010   no surgery required  . 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 APPENDECTOMY     6 YOA  . CESAREAN SECTION  01/06/2013   Procedure: CESAREAN SECTION;  Surgeon: Hal Morales, MD;  Location: [facility];  Service: Obstetrics;  Laterality: N/A;  Primary  . CESAREAN SECTION N/A 10/09/2014   Procedure: CESAREAN SECTION;  Surgeon: Konrad Felix, MD;  Location: [facility];  Service: Obstetrics;  Laterality: N/A;  . CESAREAN SECTION N/A 08/09/2017   Procedure: REPEAT CESAREAN SECTION;  Surgeon: Willodean Rosenthal, MD;  Location: [facility];  Service: Obstetrics;  Laterality: N/A;  . CHOLECYSTECTOMY N/A 11/27/2014   Procedure: LAPAROSCOPIC CHOLECYSTECTOMY WITH INTRAOPERATIVE CHOLANGIOGRAM;  Surgeon: Chevis Pretty III, MD;  Location: [facility];  Service: General;  Laterality: N/A;  . CYSTECTOMY     Family History  Problem Relation Age of Onset  . Heart disease Mother        PALPITATIONS  . Hypertension Mother    Social History  Substance Use Topics  . Smoking status: Never Smoker  . Smokeless tobacco: Never Used  . Alcohol use No   ROS See pertinent in HPI  Blood pressure 126/87, pulse 69, last menstrual period 11/08/2016, unknown if currently breastfeeding. GENERAL: Well-developed, well-nourished female in no acute distress.  ABDOMEN: Soft, nontender, nondistended.  Incision: healing well. Central area measuring 0.5 cm of skin  separation. No erythema, induration or drainage. PELVIC: Not indicated EXTREMITIES: No cyanosis, clubbing, or edema, 2+ distal pulses.  A/P 41 yo s/p repeat c-section on 8/13 here for wound check - Reassurance provided - Incision healing well - Wound care instructions reviewed - RTC on 9/10 for postpartum visit or prn

## 2017-09-06 ENCOUNTER — Encounter: Payer: Self-pay | Admitting: Obstetrics and Gynecology

## 2017-09-06 ENCOUNTER — Ambulatory Visit (INDEPENDENT_AMBULATORY_CARE_PROVIDER_SITE_OTHER): Payer: BLUE CROSS/BLUE SHIELD | Admitting: Obstetrics and Gynecology

## 2017-09-06 DIAGNOSIS — Z1389 Encounter for screening for other disorder: Secondary | ICD-10-CM | POA: Diagnosis not present

## 2017-09-06 NOTE — Progress Notes (Signed)
[facility].

## 2017-09-06 NOTE — Progress Notes (Signed)
Subjective:     Gabriela Le is a 41 y.o. female who presents for a postpartum visit. She is 4 weeks postpartum following a low cervical transverse Cesarean section. I have fully reviewed the prenatal and intrapartum course. The delivery was at 39 gestational weeks. Outcome: repeat cesarean section, low transverse incision. Anesthesia: spinal. Postpartum course has been uncomplicated. Baby's course has been uncomplicated. Baby is feeding by both breast and bottle - Similac Advance. Bleeding no bleeding. Bowel function is normal. Bladder function is normal. Patient is not sexually active. Contraception method is abstinence. Postpartum depression screening: negative.     Review of Systems Pertinent items are noted in HPI.   Objective:    BP 128/81   Pulse 75   Wt 119 lb 9.6 oz (54.3 kg)   LMP  (LMP Unknown)   Breastfeeding? Yes   BMI 24.16 kg/m   General:  alert, cooperative and no distress   Breasts:  inspection negative, no nipple discharge or bleeding, no masses or nodularity palpable  Lungs: clear to auscultation bilaterally  Heart:  regular rate and rhythm  Abdomen: soft, non-tender; bowel sounds normal; no masses,  no organomegaly  Incision: no erythema, induration or drainage- healing well   Vulva:  normal  Vagina: normal vagina, no discharge, exudate, lesion, or erythema  Cervix:  multiparous appearance  Corpus: normal size, contour, position, consistency, mobility, non-tender  Adnexa:  normal adnexa and no mass, fullness, tenderness  Rectal Exam: Not performed.        Assessment:     Normal postpartum exam. Pap smear not done at today's visit (normal 01/2017).   Plan:    1. Contraception: Ortho-Evra patches weekly when milk supply well established. She declined other methods in the meantime 2. Patient is medically cleared to resume all activities of daily living 3. Follow up in: 6 months or as needed.

## 2017-09-07 MED ORDER — RANITIDINE HCL 150 MG PO CAPS: 150.0000 mg | ORAL_CAPSULE | Freq: Two times a day (BID) | ORAL | 3 refills | Status: DC

## 2017-09-07 NOTE — Addendum Note (Signed)
Addended by: Catalina AntiguaONSTANT, Zaryiah Barz on: 09/07/2017 11:45 AM   Modules accepted: Orders

## 2018-08-26 ENCOUNTER — Ambulatory Visit (INDEPENDENT_AMBULATORY_CARE_PROVIDER_SITE_OTHER): Payer: BLUE CROSS/BLUE SHIELD

## 2018-08-26 VITALS — BP 125/80 | HR 81 | Wt 117.4 lb

## 2018-08-26 DIAGNOSIS — Z30017 Encounter for initial prescription of implantable subdermal contraceptive: Secondary | ICD-10-CM

## 2018-08-26 DIAGNOSIS — Z3046 Encounter for surveillance of implantable subdermal contraceptive: Secondary | ICD-10-CM

## 2018-08-26 DIAGNOSIS — Z3009 Encounter for other general counseling and advice on contraception: Secondary | ICD-10-CM | POA: Diagnosis not present

## 2018-08-26 MED ORDER — ETONOGESTREL 68 MG ~~LOC~~ IMPL
68.0000 mg | DRUG_IMPLANT | Freq: Once | SUBCUTANEOUS | Status: AC
  Administered 2018-08-26: 68 mg via SUBCUTANEOUS

## 2018-08-26 NOTE — Progress Notes (Signed)
History:  [name] is a 42 y.o. [phone] who presents to clinic today for contraception counseling. She has never used any kind of birth control but desires to start. She reports light, regular periods. Denies any medical history.    The following portions of the patient's history were reviewed and updated as appropriate: allergies, current medications, family history, past medical history, social history, past surgical history and problem list.  Review of Systems:  Review of Systems  Constitutional: Negative.  Negative for chills and fever.  Respiratory: Negative.   Cardiovascular: Negative.  Negative for chest pain.  Genitourinary: Negative.   Neurological: Negative.  Negative for dizziness and headaches.     Objective:  Physical Exam BP 125/80   Pulse 81   Wt 117 lb 6.4 oz (53.3 kg)   LMP 08/24/2018 (Exact Date)   Breastfeeding? No   BMI 23.71 kg/Le  Physical Exam  Constitutional: She is oriented to person, place, and time. She appears well-developed and well-nourished. No distress.  HENT:  Head: Normocephalic.  Eyes: Pupils are equal, round, and reactive to light.  Neck: Normal range of motion.  Cardiovascular: Normal rate and regular rhythm.  Pulmonary/Chest: Effort normal and breath sounds normal. No respiratory distress.  Abdominal: Soft. There is no tenderness.  Musculoskeletal: Normal range of motion.  Neurological: She is alert and oriented to person, place, and time.  Skin: Skin is warm and dry.  Psychiatric: She has a normal mood and affect. Her behavior is normal. Judgment and thought content normal.  Nursing note and vitals reviewed.  Nexplanon Insertion Procedure Patient identified, informed consent performed, consent signed.   Patient does understand that irregular bleeding is a very common side effect of this medication. She was advised to have backup contraception for one week after placement. Pregnancy test in clinic today was negative.  Appropriate  time out taken.  Patient's left arm was prepped and draped in the usual sterile fashion. The ruler used to measure and mark insertion area.  Patient was prepped with alcohol swab and then injected with 3 ml of 1% lidocaine.  She was prepped with betadine, Nexplanon removed from packaging,  Device confirmed in needle, then inserted full length of needle and withdrawn per handbook instructions. Nexplanon was able to palpated in the patient's arm; patient palpated the insert herself. There was minimal blood loss.  Patient insertion site covered with guaze and a pressure bandage to reduce any bruising.  The patient tolerated the procedure well and was given post procedure instructions.   Assessment & Plan:  1. Encounter for counseling regarding contraception - All options reviewed at length with patient. Patient desires Nexplanon.  2. Nexplanon insertion  Gabriela Bookbindereill, Gabriela Le, [place] 08/26/2018 11:03 AM

## 2018-08-26 NOTE — Progress Notes (Signed)
Pt is here for contraception management. Pt states she never tried the Ortho Evra patch.

## 2018-08-26 NOTE — Patient Instructions (Signed)
Etonogestrel implant What is this medicine? ETONOGESTREL (et oh noe JES trel) is a contraceptive (birth control) device. It is used to prevent pregnancy. It can be used for up to 3 years. This medicine may be used for other purposes; ask your health care provider or pharmacist if you have questions. COMMON BRAND NAME(S): Implanon, Nexplanon What should I tell my health care provider before I take this medicine? They need to know if you have any of these conditions: -abnormal vaginal bleeding -blood vessel disease or blood clots -cancer of the breast, cervix, or liver -depression -diabetes -gallbladder disease -headaches -heart disease or recent heart attack -high blood pressure -high cholesterol -kidney disease -liver disease -renal disease -seizures -tobacco smoker -an unusual or allergic reaction to etonogestrel, other hormones, anesthetics or antiseptics, medicines, foods, dyes, or preservatives -pregnant or trying to get pregnant -breast-feeding How should I use this medicine? This device is inserted just under the skin on the inner side of your upper arm by a health care professional. Talk to your pediatrician regarding the use of this medicine in children. Special care may be needed. Overdosage: If you think you have taken too much of this medicine contact a poison control center or emergency room at once. NOTE: This medicine is only for you. Do not share this medicine with others. What if I miss a dose? This does not apply. What may interact with this medicine? Do not take this medicine with any of the following medications: -amprenavir -bosentan -fosamprenavir This medicine may also interact with the following medications: -barbiturate medicines for inducing sleep or treating seizures -certain medicines for fungal infections like ketoconazole and itraconazole -grapefruit juice -griseofulvin -medicines to treat seizures like carbamazepine, felbamate, oxcarbazepine,  phenytoin, topiramate -modafinil -phenylbutazone -rifampin -rufinamide -some medicines to treat HIV infection like atazanavir, indinavir, lopinavir, nelfinavir, tipranavir, ritonavir -St. John's wort This list may not describe all possible interactions. Give your health care provider a list of all the medicines, herbs, non-prescription drugs, or dietary supplements you use. Also tell them if you smoke, drink alcohol, or use illegal drugs. Some items may interact with your medicine. What should I watch for while using this medicine? This product does not protect you against HIV infection (AIDS) or other sexually transmitted diseases. You should be able to feel the implant by pressing your fingertips over the skin where it was inserted. Contact your doctor if you cannot feel the implant, and use a non-hormonal birth control method (such as condoms) until your doctor confirms that the implant is in place. If you feel that the implant may have broken or become bent while in your arm, contact your healthcare provider. What side effects may I notice from receiving this medicine? Side effects that you should report to your doctor or health care professional as soon as possible: -allergic reactions like skin rash, itching or hives, swelling of the face, lips, or tongue -breast lumps -changes in emotions or moods -depressed mood -heavy or prolonged menstrual bleeding -pain, irritation, swelling, or bruising at the insertion site -scar at site of insertion -signs of infection at the insertion site such as fever, and skin redness, pain or discharge -signs of pregnancy -signs and symptoms of a blood clot such as breathing problems; changes in vision; chest pain; severe, sudden headache; pain, swelling, warmth in the leg; trouble speaking; sudden numbness or weakness of the face, arm or leg -signs and symptoms of liver injury like dark yellow or brown urine; general ill feeling or flu-like symptoms;  light-colored   stools; loss of appetite; nausea; right upper belly pain; unusually weak or tired; yellowing of the eyes or skin -unusual vaginal bleeding, discharge -signs and symptoms of a stroke like changes in vision; confusion; trouble speaking or understanding; severe headaches; sudden numbness or weakness of the face, arm or leg; trouble walking; dizziness; loss of balance or coordination Side effects that usually do not require medical attention (report to your doctor or health care professional if they continue or are bothersome): -acne -back pain -breast pain -changes in weight -dizziness -general ill feeling or flu-like symptoms -headache -irregular menstrual bleeding -nausea -sore throat -vaginal irritation or inflammation This list may not describe all possible side effects. Call your doctor for medical advice about side effects. You may report side effects to FDA at 1-800-FDA-1088. Where should I keep my medicine? This drug is given in a hospital or clinic and will not be stored at home. NOTE: This sheet is a summary. It may not cover all possible information. If you have questions about this medicine, talk to your doctor, pharmacist, or health care provider.  2018 Elsevier/Gold Standard (2016-07-02 11:19:22) Nexplanon Instructions After Insertion   Keep bandage clean and dry for 24 hours   May use ice/Tylenol/Ibuprofen for soreness or pain   If you develop fever, drainage or increased warmth from incision site-contact office immediately   

## 2018-08-30 ENCOUNTER — Encounter: Payer: Self-pay | Admitting: *Deleted

## 2018-11-01 ENCOUNTER — Other Ambulatory Visit: Payer: Self-pay

## 2018-11-01 DIAGNOSIS — B001 Herpesviral vesicular dermatitis: Secondary | ICD-10-CM

## 2018-11-01 MED ORDER — ACYCLOVIR 5 % EX OINT
1.0000 | TOPICAL_OINTMENT | Freq: Two times a day (BID) | CUTANEOUS | 1 refills | Status: DC
Start: 2018-11-01 — End: 2018-11-03

## 2018-11-03 ENCOUNTER — Other Ambulatory Visit: Payer: Self-pay

## 2018-11-03 ENCOUNTER — Telehealth: Payer: Self-pay

## 2018-11-03 DIAGNOSIS — B001 Herpesviral vesicular dermatitis: Secondary | ICD-10-CM

## 2018-11-03 MED ORDER — ACYCLOVIR 5 % EX OINT: 1.0000 "application " | TOPICAL_OINTMENT | Freq: Two times a day (BID) | CUTANEOUS | 1 refills | Status: DC

## 2018-11-03 NOTE — Telephone Encounter (Signed)
Call patient to inform her that her Family Planning Medicaid only pays for Birthcontrol. She can get a coupon on [email or web address] to help defray cost.  Leaving coupon and Pharmacy Discount Card with Rx at front desk for her to pick and use at any Pharmacy of her choice.  [facility] is still expensive with coupon.

## 2019-07-10 ENCOUNTER — Encounter: Payer: Self-pay | Admitting: Family

## 2019-07-10 ENCOUNTER — Ambulatory Visit (INDEPENDENT_AMBULATORY_CARE_PROVIDER_SITE_OTHER): Payer: BLUE CROSS/BLUE SHIELD | Admitting: Family

## 2019-07-10 ENCOUNTER — Other Ambulatory Visit: Payer: Self-pay

## 2019-07-10 ENCOUNTER — Other Ambulatory Visit (INDEPENDENT_AMBULATORY_CARE_PROVIDER_SITE_OTHER): Payer: BLUE CROSS/BLUE SHIELD

## 2019-07-10 VITALS — BP 112/80 | HR 85 | Temp 97.4°F | Ht 59.0 in | Wt 126.0 lb

## 2019-07-10 DIAGNOSIS — M791 Myalgia, unspecified site: Secondary | ICD-10-CM | POA: Diagnosis not present

## 2019-07-10 DIAGNOSIS — R5383 Other fatigue: Secondary | ICD-10-CM

## 2019-07-10 DIAGNOSIS — N926 Irregular menstruation, unspecified: Secondary | ICD-10-CM

## 2019-07-10 LAB — CBC WITH DIFFERENTIAL/PLATELET
Basophils Absolute: 0 10*3/uL (ref 0.0–0.1)
Basophils Relative: 0.3 % (ref 0.0–3.0)
Eosinophils Absolute: 0.1 10*3/uL (ref 0.0–0.7)
Eosinophils Relative: 0.6 % (ref 0.0–5.0)
HCT: 37.7 % (ref 36.0–46.0)
Hemoglobin: 12.6 g/dL (ref 12.0–15.0)
Lymphocytes Relative: 37.6 % (ref 12.0–46.0)
Lymphs Abs: 3.4 10*3/uL (ref 0.7–4.0)
MCHC: 33.4 g/dL (ref 30.0–36.0)
MCV: 92.6 fl (ref 78.0–100.0)
Monocytes Absolute: 0.4 10*3/uL (ref 0.1–1.0)
Monocytes Relative: 4.4 % (ref 3.0–12.0)
Neutro Abs: 5.2 10*3/uL (ref 1.4–7.7)
Neutrophils Relative %: 57.1 % (ref 43.0–77.0)
Platelets: 197 10*3/uL (ref 150.0–400.0)
RBC: 4.07 Mil/uL (ref 3.87–5.11)
RDW: 13.3 % (ref 11.5–15.5)
WBC: 9.2 10*3/uL (ref 4.0–10.5)

## 2019-07-10 LAB — SEDIMENTATION RATE: Sed Rate: 14 mm/hr (ref 0–20)

## 2019-07-10 NOTE — Progress Notes (Signed)
Gabriela Le is a 43 y.o. female with the following history as recorded in EpicCare:  Patient Active Problem List   Diagnosis Date Noted  . Status post repeat low transverse cesarean section 08/12/2017  . Post-operative state 08/09/2017  . Late prenatal care in third trimester 06/23/2017  . Supervision of high risk pregnancy, antepartum 02/15/2017  . Advanced maternal age in multigravida, unspecified trimester 02/15/2017  . Non-English speaking patient 02/15/2017  . Elderly multigravida with antepartum condition or complication 00/45/9977  . Previous cesarean delivery, antepartum condition or complication 41/42/3953  . 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 etonogestrel (NEXPLANON) 68 MG IMPL implant 1 each by Subdermal route once.     No current facility-administered medications for this visit.     Allergies: Patient has no known allergies.  Past Medical History:  Diagnosis Date  . GERD (gastroesophageal reflux disease)   . H/O cold sores    ACYCLOVIR OINTMENT PRN  . Infertility associated with anovulation    HAS TAKEN CLOMID IN THE PAST  . Irregular menses   . Missed ab 12/29/2010   no surgery required  . 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 APPENDECTOMY     6 YOA  . CESAREAN SECTION  01/06/2013   Procedure: CESAREAN SECTION;  Surgeon: Eldred Manges, MD;  Location: [facility];  Service: Obstetrics;  Laterality: N/A;  Primary  . CESAREAN SECTION N/A 10/09/2014   Procedure: CESAREAN SECTION;  Surgeon: Alinda Dooms, MD;  Location: [facility];  Service: Obstetrics;  Laterality: N/A;  . CESAREAN SECTION N/A 08/09/2017   Procedure: REPEAT CESAREAN SECTION;  Surgeon: Lavonia Drafts, MD;  Location: [facility];  Service: Obstetrics;  Laterality: N/A;  . CHOLECYSTECTOMY N/A 11/27/2014   Procedure: LAPAROSCOPIC CHOLECYSTECTOMY WITH  INTRAOPERATIVE CHOLANGIOGRAM;  Surgeon: Autumn Messing III, MD;  Location: [facility];  Service: General;  Laterality: N/A;  . CYSTECTOMY      Family History  Problem Relation Age of Onset  . Heart disease Mother        PALPITATIONS  . Hypertension Mother     Social History   Tobacco Use  . Smoking status: Never Smoker  . Smokeless tobacco: Never Used  Substance Use Topics  . Alcohol use: No    Subjective:  Accompanied by husband who serves as Optometrist; 2 year history of right knee pain- no known injury or trauma; no swelling in the knee;  1 year history of left shoulder pain- no known injury or trauma; was last seen here in 07/2016 with complaint of right shoulder pain; has 15 yo child at home- does carry often on the left arm; notes she cannot completely raise her left arm;  Limited benefit with OTC Tylenol;  LMP-July 5; is requesting pregnancy test; notes that her periods have been irregular and this was the presenting symptom with her other pregnancies; does have Nexplanon in place;   Also wonders about treatment options for recurrent cold sores- feels she is having increased outbreaks recently; would be interested in taking a medication as long as she is not pregnant.      Objective:  Vitals:   07/10/19 1543  BP: 112/80  Pulse: 85  Temp: (!) 97.4 F (36.3 C)  TempSrc: Oral  SpO2: 98%  Weight: 126 lb (57.2 kg)  Height: 4' 11" (1.499 m)    General: Well developed, well  nourished, in no acute distress  Skin : Warm and dry.  Head: Normocephalic and atraumatic  Eyes: Sclera and conjunctiva clear; pupils round and reactive to light; extraocular movements intact  Neck: Supple without thyromegaly, adenopathy  Lungs: Respirations unlabored; clear to auscultation bilaterally without wheeze, rales, rhonchi  Musculoskeletal: No deformities; no active joint inflammation; LROM on active and passive motion of left shoulder Extremities: No edema, cyanosis, clubbing  Vessels: Symmetric  bilaterally  Neurologic: Alert and oriented; speech intact; face symmetrical; moves all extremities well; CNII-XII intact without focal deficit   Assessment:  1. Myalgia   2. Other fatigue   3. Irregular periods     Plan:  1. Update labs today; assuming she is not pregnant, will update X-rays and most likely refer to sports medicine; follow-up to be determined. 2. Check Vitamin D, B12 and TSH today; 3. Will check pregnancy test per patient request; low suspicion for pregnancy due to Nexplanon;    No follow-ups on file.  Orders Placed This Encounter  Procedures  . CBC w/Diff    Standing Status:   Future    Number of Occurrences:   1    Standing Expiration Date:   07/09/2020  . Comp Met (CMET)    Standing Status:   Future    Number of Occurrences:   1    Standing Expiration Date:   07/09/2020  . TSH    Standing Status:   Future    Number of Occurrences:   1    Standing Expiration Date:   07/09/2020  . B12    Standing Status:   Future    Number of Occurrences:   1    Standing Expiration Date:   07/09/2020  . Antinuclear Antib (ANA)    Standing Status:   Future    Number of Occurrences:   1    Standing Expiration Date:   07/09/2020  . Sed Rate (ESR)    Standing Status:   Future    Number of Occurrences:   1    Standing Expiration Date:   07/09/2020  . Rheumatoid Factor    Standing Status:   Future    Number of Occurrences:   1    Standing Expiration Date:   07/09/2020  . hCG, serum, qualitative    Standing Status:   Future    Number of Occurrences:   1    Standing Expiration Date:   07/09/2020  . Vitamin D (25 hydroxy)    Standing Status:   Future    Number of Occurrences:   1    Standing Expiration Date:   07/09/2020    Requested Prescriptions    No prescriptions requested or ordered in this encounter     

## 2019-07-11 ENCOUNTER — Other Ambulatory Visit: Payer: Self-pay | Admitting: Family

## 2019-07-11 DIAGNOSIS — M25512 Pain in left shoulder: Secondary | ICD-10-CM

## 2019-07-11 DIAGNOSIS — G8929 Other chronic pain: Secondary | ICD-10-CM

## 2019-07-11 LAB — RHEUMATOID FACTOR: Rhuematoid fact SerPl-aCnc: <14 IU/mL (ref <14)

## 2019-07-11 LAB — COMPREHENSIVE METABOLIC PANEL
ALT: 6 U/L (ref 0–35)
AST: 10 U/L (ref 0–37)
Albumin: 4.1 g/dL (ref 3.5–5.2)
Alkaline Phosphatase: 55 U/L (ref 39–117)
BUN: 15 mg/dL (ref 6–23)
CO2: 30 mEq/L (ref 19–32)
Calcium: 8.7 mg/dL (ref 8.4–10.5)
Chloride: 105 mEq/L (ref 96–112)
Creatinine, Ser: 0.51 mg/dL (ref 0.40–1.20)
GFR: 131.29 mL/min (ref >60.00)
Glucose, Bld: 114 mg/dL — ABNORMAL HIGH (ref 70–99)
Potassium: 3.8 mEq/L (ref 3.5–5.1)
Sodium: 138 mEq/L (ref 135–145)
Total Bilirubin: 0.2 mg/dL (ref 0.2–1.2)
Total Protein: 7 g/dL (ref 6.0–8.3)

## 2019-07-11 LAB — VITAMIN D 25 HYDROXY (VIT D DEFICIENCY, FRACTURES): VITD: 26.27 ng/mL — ABNORMAL LOW (ref 30.00–100.00)

## 2019-07-11 LAB — TSH: TSH: 1.33 u[IU]/mL (ref 0.35–4.50)

## 2019-07-11 LAB — VITAMIN B12: Vitamin B-12: 304 pg/mL (ref 211–911)

## 2019-07-11 LAB — HCG, SERUM, QUALITATIVE: Preg, Serum: NEGATIVE

## 2019-07-11 LAB — ANA: Anti Nuclear Antibody (ANA): NEGATIVE

## 2019-08-07 ENCOUNTER — Telehealth: Payer: Self-pay

## 2019-08-07 DIAGNOSIS — M25519 Pain in unspecified shoulder: Secondary | ICD-10-CM

## 2019-08-07 DIAGNOSIS — G8929 Other chronic pain: Secondary | ICD-10-CM

## 2019-08-07 NOTE — Addendum Note (Signed)
Addended by: Sherlene Shams on: 08/07/2019 02:51 PM   Modules accepted: Orders

## 2019-08-07 NOTE — Telephone Encounter (Signed)
Copied from [place] [id number]. Topic: General - Inquiry >> Aug 07, 2019  2:29 PM Richardo Priest, [place] wrote: Reason for CRM: Patient's husband called in stating he is still concerned in regards to his wife's x-rays for her shoulders and would like her referred to a specialist. Please advise and call back is [phone].

## 2019-08-07 NOTE — Telephone Encounter (Signed)
I will refer her to orthopedics. Since they never got the X-rays here that were ordered, just wait and get them done at the orthopedist office.

## 2019-08-08 NOTE — Telephone Encounter (Signed)
Spoke with husband and info given. 

## 2019-08-21 ENCOUNTER — Encounter: Payer: Self-pay | Admitting: Physician Assistant

## 2019-08-21 ENCOUNTER — Ambulatory Visit: Payer: Self-pay

## 2019-08-21 ENCOUNTER — Ambulatory Visit (INDEPENDENT_AMBULATORY_CARE_PROVIDER_SITE_OTHER): Payer: BLUE CROSS/BLUE SHIELD | Admitting: Physician Assistant

## 2019-08-21 DIAGNOSIS — M7542 Impingement syndrome of left shoulder: Secondary | ICD-10-CM | POA: Diagnosis not present

## 2019-08-21 NOTE — Progress Notes (Signed)
Office Visit Note   Patient: Gabriela Le           Date of Birth: 02/09/1976           MRN: [id number] Visit Date: 08/21/2019              Requested by: Olive BassMurray, Laura Woodruff, FNP [address] [place],  [place] [id number] PCP: Olive BassMurray, Laura Woodruff, FNP   Assessment & Plan: Visit Diagnoses:  1. Shoulder impingement syndrome, left     Plan: She shown wall crawls, Codman, and forward flexion exercises that she can perform on her own.  If pain persist or comes worse she will follow-up with us.  Questions encouraged and answered using a interpreter today.  Follow-Up Instructions: Return if symptoms worsen or fail to improve.   Orders:  Orders Placed This Encounter  Procedures  . XR Shoulder Left   No orders of the defined types were placed in this encounter.     Procedures: No procedures performed   Clinical Data: No additional findings.   Subjective: Chief Complaint  Patient presents with  . Left Shoulder - Pain    HPI Gabriela Le is a 43 year old female from [place].  She presents today with a interpreter.  She speaks some [place].  She has had ongoing left shoulder pain for about a year.  States is consistent with her right shoulder pain that she had an injection for and is done well with.  She denies any neck pain.  She does have some pain and what she describes as numbness down to her left elbow though.  She states the pain is worse with range of motion particularly overhead with the left shoulder. Review of Systems Negative for fevers or chills.  Objective: Vital Signs: There were no vitals taken for this visit.  Physical Exam Constitutional:      Appearance: She is not ill-appearing or diaphoretic.  Pulmonary:     Effort: Pulmonary effort is normal.  Neurological:     Mental Status: She is alert and oriented to person, place, and time.  Psychiatric:        Behavior: Behavior normal.     Ortho Exam Left shoulder positive impingement sign.  Left  shoulder positive empty can test.  She has 5 out of 5 strength with external and internal rotation bilateral shoulders.  Passively aggravating her left arm above her head.  Full range of motion cervical spine without pain.  Nontender on the medial border of left scapula.  Specialty Comments:  No specialty comments available.  Imaging: Xr Shoulder Left  Result Date: 08/21/2019 Right shoulder 3 views: Shoulders well located.  Glenohumeral joint is well-maintained.  Subacromial space well maintained.  No arthritic changes noted.  No bony abnormalities.    PMFS History: Patient Active Problem List   Diagnosis Date Noted  . Status post repeat low transverse cesarean section 08/12/2017  . Post-operative state 08/09/2017  . Late prenatal care in third trimester 06/23/2017  . Supervision of high risk pregnancy, antepartum 02/15/2017  . Advanced maternal age in multigravida, unspecified trimester 02/15/2017  . Non-English speaking patient 02/15/2017  . Elderly multigravida with antepartum condition or complication 10/06/2014  . Previous cesarean delivery, antepartum condition or complication 10/06/2014  . 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 GERD (gastroesophageal reflux disease)   . H/O cold sores    ACYCLOVIR OINTMENT PRN  . Infertility associated with anovulation  HAS TAKEN CLOMID IN THE PAST  . Irregular menses   . Missed ab 12/29/2010   no surgery required  . 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    Family History  Problem Relation Age of Onset  . Heart disease Mother        PALPITATIONS  . Hypertension Mother     Past Surgical History:  Procedure Laterality Date  . APPENDECTOMY     6 YOA  . CESAREAN SECTION  01/06/2013   Procedure: CESAREAN SECTION;  Surgeon: Eldred Manges, MD;  Location: [facility];  Service: Obstetrics;  Laterality: N/A;  Primary  . CESAREAN SECTION N/A 10/09/2014   Procedure: CESAREAN SECTION;   Surgeon: Alinda Dooms, MD;  Location: [facility];  Service: Obstetrics;  Laterality: N/A;  . CESAREAN SECTION N/A 08/09/2017   Procedure: REPEAT CESAREAN SECTION;  Surgeon: Lavonia Drafts, MD;  Location: [facility];  Service: Obstetrics;  Laterality: N/A;  . CHOLECYSTECTOMY N/A 11/27/2014   Procedure: LAPAROSCOPIC CHOLECYSTECTOMY WITH INTRAOPERATIVE CHOLANGIOGRAM;  Surgeon: Autumn Messing III, MD;  Location: [facility];  Service: General;  Laterality: N/A;  . CYSTECTOMY     Social History   Occupational History  . Occupation: STUDENT    Comment: [employer]  Tobacco Use  . Smoking status: Never Smoker  . Smokeless tobacco: Never Used  Substance and Sexual Activity  . Alcohol use: No  . Drug use: No  . Sexual activity: Yes    Partners: Male    Birth control/protection: None    Comment: pregnant

## 2019-09-06 ENCOUNTER — Ambulatory Visit (INDEPENDENT_AMBULATORY_CARE_PROVIDER_SITE_OTHER): Payer: BLUE CROSS/BLUE SHIELD | Admitting: Family Medicine

## 2019-09-06 ENCOUNTER — Other Ambulatory Visit: Payer: Self-pay

## 2019-09-06 ENCOUNTER — Encounter: Payer: Self-pay | Admitting: Family Medicine

## 2019-09-06 VITALS — BP 134/84 | HR 69 | Wt 124.0 lb

## 2019-09-06 DIAGNOSIS — Z3046 Encounter for surveillance of implantable subdermal contraceptive: Secondary | ICD-10-CM | POA: Diagnosis not present

## 2019-09-06 NOTE — Progress Notes (Addendum)
     GYNECOLOGY OFFICE PROCEDURE NOTE  Patient presented with [name] who was present throughout visit.   Gabriela Le is a 43 y.o. [phone] here for Nexplanon removal.  Last pap smear was on 02/15/2017 and was normal.  No other gynecologic concerns. Patient reports that since placement in August 2019 she has had continued pain in left arm and now has trouble lifting her left arm above her head. She received a steroid injection by PCP without resolution. Discussed that Nexplanon may or may not be causing pain in left arm. Patient desires removal nonetheless and reports it is okay should she become pregnant.   Nexplanon Removal Patient identified, informed consent performed, consent signed.   Appropriate time out taken. Nexplanon site identified.  Area prepped in usual sterile fashon. 5 ml of 1% lidocaine was used to anesthetize the area at the distal end of the implant. A small stab incision was made right beside the implant on the distal portion.  The Nexplanon rod was grasped using hemostats and removed without difficulty.  There was minimal blood loss. There were no complications. Guaze and bandaid were applied over the small incision.  A pressure bandage was applied to reduce any bruising.  The patient tolerated the procedure well and was given post procedure instructions.  Patient is planning to use natural family planning for contraception/attempt conception.   Barrington Ellison, MD [facility] Family Medicine Fellow, [facility] for [employer], [place]

## 2019-09-06 NOTE — Progress Notes (Signed)
Pt is in the office for nexplanon removal, inserted in 2019. Pt does not desire another form of BC. Pt complains of pain in arm since insertion.

## 2020-03-25 ENCOUNTER — Ambulatory Visit: Payer: Medicaid Other

## 2020-08-12 ENCOUNTER — Ambulatory Visit (INDEPENDENT_AMBULATORY_CARE_PROVIDER_SITE_OTHER): Payer: 59 | Admitting: Family

## 2020-08-12 ENCOUNTER — Other Ambulatory Visit: Payer: Self-pay

## 2020-08-12 ENCOUNTER — Encounter: Payer: Self-pay | Admitting: Family

## 2020-08-12 VITALS — BP 118/74 | HR 81 | Temp 98.5°F | Ht 59.0 in | Wt 127.0 lb

## 2020-08-12 DIAGNOSIS — L659 Nonscarring hair loss, unspecified: Secondary | ICD-10-CM

## 2020-08-12 DIAGNOSIS — B009 Herpesviral infection, unspecified: Secondary | ICD-10-CM

## 2020-08-12 DIAGNOSIS — G8929 Other chronic pain: Secondary | ICD-10-CM

## 2020-08-12 DIAGNOSIS — M25512 Pain in left shoulder: Secondary | ICD-10-CM

## 2020-08-12 DIAGNOSIS — Z13220 Encounter for screening for lipoid disorders: Secondary | ICD-10-CM

## 2020-08-12 DIAGNOSIS — M791 Myalgia, unspecified site: Secondary | ICD-10-CM

## 2020-08-12 MED ORDER — VALACYCLOVIR HCL 500 MG PO TABS: ORAL_TABLET | ORAL | 2 refills | Status: DC

## 2020-08-12 NOTE — Patient Instructions (Signed)
[facility]  [phone]

## 2020-08-12 NOTE — Progress Notes (Signed)
Gabriela Le is a 44 y.o. female with the following history as recorded in EpicCare:  Patient Active Problem List   Diagnosis Date Noted  . Status post repeat low transverse cesarean section 08/12/2017  . Late prenatal care in third trimester 06/23/2017  . Supervision of high risk pregnancy, antepartum 02/15/2017  . Advanced maternal age in multigravida, unspecified trimester 02/15/2017  . Non-English speaking patient 02/15/2017  . Previous cesarean delivery, antepartum condition or complication 88/32/5498  . 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 etonogestrel (NEXPLANON) 68 MG IMPL implant 1 each by Subdermal route once.    . valACYclovir (VALTREX) 500 MG tablet Take bid as directed 60 tablet 2   No current facility-administered medications for this visit.    Allergies: Patient has no known allergies.  Past Medical History:  Diagnosis Date  . GERD (gastroesophageal reflux disease)   . H/O cold sores    ACYCLOVIR OINTMENT PRN  . Infertility associated with anovulation    HAS TAKEN CLOMID IN THE PAST  . Irregular menses   . Missed ab 12/29/2010   no surgery required  . 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 APPENDECTOMY     6 YOA  . CESAREAN SECTION  01/06/2013   Procedure: CESAREAN SECTION;  Surgeon: Eldred Manges, MD;  Location: [facility];  Service: Obstetrics;  Laterality: N/A;  Primary  . CESAREAN SECTION N/A 10/09/2014   Procedure: CESAREAN SECTION;  Surgeon: Alinda Dooms, MD;  Location: [facility];  Service: Obstetrics;  Laterality: N/A;  . CESAREAN SECTION N/A 08/09/2017   Procedure: REPEAT CESAREAN SECTION;  Surgeon: Lavonia Drafts, MD;  Location: [facility];  Service: Obstetrics;  Laterality: N/A;  . CHOLECYSTECTOMY N/A 11/27/2014   Procedure: LAPAROSCOPIC CHOLECYSTECTOMY WITH INTRAOPERATIVE CHOLANGIOGRAM;  Surgeon:  Autumn Messing III, MD;  Location: [facility];  Service: General;  Laterality: N/A;  . CYSTECTOMY      Family History  Problem Relation Age of Onset  . Heart disease Mother        PALPITATIONS  . Hypertension Mother     Social History   Tobacco Use  . Smoking status: Never Smoker  . Smokeless tobacco: Never Used  Substance Use Topics  . Alcohol use: No    Subjective:  Accompanied by husband who serves as Optometrist;  1) Chronic left shoulder pain; seen with same complaints last year; is under the care of orthopedist- requesting referral back; does feel that she is losing ROM in her left shoulder;  2) Would like to get updated Rx for Valtrex; has taken in the past and done well; 3) Concerned about hair loss- would like to come back at later date when fasting to get labs updated;     Objective:  Vitals:   08/12/20 1300  BP: 118/74  Pulse: 81  Temp: 98.5 F (36.9 C)  TempSrc: Oral  SpO2: 99%  Weight: 127 lb (57.6 kg)  Height: '4\' 11"'  (1.499 m)    General: Well developed, well nourished, in no acute distress  Skin : Warm and dry.  Head: Normocephalic and atraumatic  Lungs: Respirations unlabored; Musculoskeletal: No deformities; no active joint inflammation  Extremities: No edema, cyanosis, clubbing  Vessels: Symmetric bilaterally  Neurologic: Alert and oriented; speech intact; face symmetrical; moves all extremities well; CNII-XII intact without focal deficit   Assessment:  1. Chronic left shoulder pain  2. Myalgia   3. Lipid screening   4. Alopecia   5. HSV-1 infection     Plan:  1. Refer back to her orthopedist; suspect she will need MRI; 2. Update labs at her convenience; 3. Check lipid panel when fasting; 4. B12 level was down when labs were done last year- will re-check; may need to consider injections; 5. Trial of Valtrex- she will take bid x 5 days for current outbreak and then take daily for maintenance; she will try maintenance dosing for 3-6 months and then  re-evaluate response.  This visit occurred during the SARS-CoV-2 public health emergency.  Safety protocols were in place, including screening questions prior to the visit, additional usage of staff PPE, and extensive cleaning of exam room while observing appropriate contact time as indicated for disinfecting solutions.     No follow-ups on file.  Orders Placed This Encounter  Procedures  . CBC with Differential/Platelet    Standing Status:   Future    Standing Expiration Date:   08/12/2021  . Comp Met (CMET)    Standing Status:   Future    Standing Expiration Date:   08/12/2021  . TSH    Standing Status:   Future    Standing Expiration Date:   08/12/2021  . B12    Standing Status:   Future    Standing Expiration Date:   08/12/2021  . Antinuclear Antib (ANA)    Standing Status:   Future    Standing Expiration Date:   08/12/2021  . Lipid panel    Standing Status:   Future    Standing Expiration Date:   08/12/2021  . Ambulatory referral to Orthopedic Surgery    Referral Priority:   Routine    Referral Type:   Surgical    Referral Reason:   Specialty Services Required    Referred to Provider:   Pete Pelt, PA-C    Requested Specialty:   Orthopedic Surgery    Number of Visits Requested:   1    Requested Prescriptions   Signed Prescriptions Disp Refills  . valACYclovir (VALTREX) 500 MG tablet 60 tablet 2    Sig: Take bid as directed

## 2020-08-13 ENCOUNTER — Other Ambulatory Visit (INDEPENDENT_AMBULATORY_CARE_PROVIDER_SITE_OTHER): Payer: 59

## 2020-08-13 DIAGNOSIS — Z13220 Encounter for screening for lipoid disorders: Secondary | ICD-10-CM | POA: Diagnosis not present

## 2020-08-13 DIAGNOSIS — M791 Myalgia, unspecified site: Secondary | ICD-10-CM

## 2020-08-13 LAB — CBC WITH DIFFERENTIAL/PLATELET
Basophils Absolute: 0 10*3/uL (ref 0.0–0.1)
Basophils Relative: 0.2 % (ref 0.0–3.0)
Eosinophils Absolute: 0.1 10*3/uL (ref 0.0–0.7)
Eosinophils Relative: 0.8 % (ref 0.0–5.0)
HCT: 37.8 % (ref 36.0–46.0)
Hemoglobin: 12.6 g/dL (ref 12.0–15.0)
Lymphocytes Relative: 37.8 % (ref 12.0–46.0)
Lymphs Abs: 3.1 10*3/uL (ref 0.7–4.0)
MCHC: 33.4 g/dL (ref 30.0–36.0)
MCV: 89 fl (ref 78.0–100.0)
Monocytes Absolute: 0.4 10*3/uL (ref 0.1–1.0)
Monocytes Relative: 5.1 % (ref 3.0–12.0)
Neutro Abs: 4.6 10*3/uL (ref 1.4–7.7)
Neutrophils Relative %: 56.1 % (ref 43.0–77.0)
Platelets: 168 10*3/uL (ref 150.0–400.0)
RBC: 4.25 Mil/uL (ref 3.87–5.11)
RDW: 14.4 % (ref 11.5–15.5)
WBC: 8.1 10*3/uL (ref 4.0–10.5)

## 2020-08-13 LAB — TSH: TSH: 2.09 u[IU]/mL (ref 0.35–4.50)

## 2020-08-13 LAB — LIPID PANEL
Cholesterol: 205 mg/dL — ABNORMAL HIGH (ref 0–200)
HDL: 45.5 mg/dL (ref >39.00)
LDL Cholesterol: 134 mg/dL — ABNORMAL HIGH (ref 0–99)
NonHDL: 159.96
Total CHOL/HDL Ratio: 5
Triglycerides: 128 mg/dL (ref 0.0–149.0)
VLDL: 25.6 mg/dL (ref 0.0–40.0)

## 2020-08-13 LAB — VITAMIN B12: Vitamin B-12: 577 pg/mL (ref 211–911)

## 2020-08-13 LAB — COMPREHENSIVE METABOLIC PANEL
ALT: 12 U/L (ref 0–35)
AST: 14 U/L (ref 0–37)
Albumin: 4.1 g/dL (ref 3.5–5.2)
Alkaline Phosphatase: 63 U/L (ref 39–117)
BUN: 14 mg/dL (ref 6–23)
CO2: 30 mEq/L (ref 19–32)
Calcium: 9.2 mg/dL (ref 8.4–10.5)
Chloride: 105 mEq/L (ref 96–112)
Creatinine, Ser: 0.58 mg/dL (ref 0.40–1.20)
GFR: 112.61 mL/min (ref >60.00)
Glucose, Bld: 94 mg/dL (ref 70–99)
Potassium: 3.8 mEq/L (ref 3.5–5.1)
Sodium: 140 mEq/L (ref 135–145)
Total Bilirubin: 0.4 mg/dL (ref 0.2–1.2)
Total Protein: 7.4 g/dL (ref 6.0–8.3)

## 2020-08-13 NOTE — Addendum Note (Signed)
Addended by: Vincenza Hews on: 08/13/2020 12:50 PM   Modules accepted: Orders

## 2020-08-14 LAB — ANA: Anti Nuclear Antibody (ANA): NEGATIVE

## 2020-08-19 ENCOUNTER — Ambulatory Visit (INDEPENDENT_AMBULATORY_CARE_PROVIDER_SITE_OTHER): Payer: 59 | Admitting: Physician Assistant

## 2020-08-19 ENCOUNTER — Other Ambulatory Visit: Payer: Self-pay | Admitting: Radiology

## 2020-08-19 ENCOUNTER — Ambulatory Visit (INDEPENDENT_AMBULATORY_CARE_PROVIDER_SITE_OTHER): Payer: 59

## 2020-08-19 ENCOUNTER — Encounter: Payer: Self-pay | Admitting: Physician Assistant

## 2020-08-19 DIAGNOSIS — M7542 Impingement syndrome of left shoulder: Secondary | ICD-10-CM | POA: Diagnosis not present

## 2020-08-19 DIAGNOSIS — M7711 Lateral epicondylitis, right elbow: Secondary | ICD-10-CM | POA: Diagnosis not present

## 2020-08-19 DIAGNOSIS — M7502 Adhesive capsulitis of left shoulder: Secondary | ICD-10-CM | POA: Diagnosis not present

## 2020-08-19 NOTE — Progress Notes (Signed)
Office Visit Note   Patient: Gabriela Le           Date of Birth: 02-Aug-1976           MRN: [id number] Visit Date: 08/19/2020              Requested by: Gabriela Bass, FNP [address] [place],  [place] [address] PCP: Gabriela Bass, FNP   Assessment & Plan: Visit Diagnoses:  1. Shoulder impingement syndrome, left   2. Adhesive capsulitis of left shoulder   3. Lateral epicondylitis, right elbow     Plan: Due to the fact the patient's shoulder pain is becoming worse and her range of motion is diminished since the last visit recommend MRI to rule out rotator cuff tear.  Have her follow-up after the MRI to go over results and discuss further treatment.  She did also ask about some pain involving her right elbow.  She had tenderness over the lateral epicondyle consistent with lateral epicondylitis.  Discussed lifting techniques stretching techniques and the use of Voltaren gel over the lateral epicondyle.  Follow-up for the elbow as needed.  Follow-Up Instructions: Return After MRI.   Orders:  Orders Placed This Encounter  Procedures  . XR Shoulder Left   No orders of the defined types were placed in this encounter.     Procedures: No procedures performed   Clinical Data: No additional findings.   Subjective: Chief Complaint  Patient presents with  . Left Shoulder - Pain    HPI Gabriela Le returns today follow-up of her left shoulder.  We last saw her August 21, 2019 and she is given an injection.  She states that the injection did not help.  She states that her range of motion is gotten progressively worse.  She tried doing the shoulder exercises that shown but she said that this caused her pain to even come worse.  She states she is unable to reach behind her due to the shoulder pain.  She has limited overhead activity due to the shoulder pain.  No numbness tingling down the arm.  She did not return due to the fact that she was planning to go back overseas  to [place]. She presents today with an interpreter as she speaks some [place] but mostly speaks [place].  Review of Systems Negative for fevers or chills.  Objective: Vital Signs: There were no vitals taken for this visit.  Physical Exam Constitutional:      Appearance: She is not ill-appearing or diaphoretic.  Pulmonary:     Effort: Pulmonary effort is normal.  Neurological:     Mental Status: She is alert.  Psychiatric:        Mood and Affect: Mood normal.     Ortho Exam Bilateral shoulders: She has full range of motion of the right shoulder without pain.  Positive impingement on the left with very limited external rotation.  Limited active and passive forward flexion only upcoming to 90 degrees actively passively I can bring her to 110 degrees is painful.  Weakness with external rotation of the left shoulder against resistance.  Positive empty can test.  Unable to perform liftoff test due to patient's decreased range of motion. Specialty Comments:  No specialty comments available.  Imaging: XR Shoulder Left  Result Date: 08/19/2020 Left shoulder 3 views: Glenohumeral joint is well-maintained.  Humeral heads well located.  No acute fractures or bony abnormalities.    PMFS History: Patient Active Problem List  Diagnosis Date Noted  . Status post repeat low transverse cesarean section 08/12/2017  . Late prenatal care in third trimester 06/23/2017  . Supervision of high risk pregnancy, antepartum 02/15/2017  . Advanced maternal age in multigravida, unspecified trimester 02/15/2017  . Non-English speaking patient 02/15/2017  . Previous cesarean delivery, antepartum condition or complication 10/06/2014  . 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 GERD (gastroesophageal reflux disease)   . H/O cold sores    ACYCLOVIR OINTMENT PRN  . Infertility associated with anovulation    HAS TAKEN CLOMID IN THE  PAST  . Irregular menses   . Missed ab 12/29/2010   no surgery required  . 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    Family History  Problem Relation Age of Onset  . Heart disease Mother        PALPITATIONS  . Hypertension Mother     Past Surgical History:  Procedure Laterality Date  . APPENDECTOMY     6 YOA  . CESAREAN SECTION  01/06/2013   Procedure: CESAREAN SECTION;  Surgeon: Hal Morales, MD;  Location: [facility];  Service: Obstetrics;  Laterality: N/A;  Primary  . CESAREAN SECTION N/A 10/09/2014   Procedure: CESAREAN SECTION;  Surgeon: Konrad Felix, MD;  Location: [facility];  Service: Obstetrics;  Laterality: N/A;  . CESAREAN SECTION N/A 08/09/2017   Procedure: REPEAT CESAREAN SECTION;  Surgeon: Willodean Rosenthal, MD;  Location: [facility]IRTHING SUIT[facility];  Service: Obstetrics;  Laterality: N/A;  . CHOLECYSTECTOMY N/A 11/27/2014   Procedure: LAPAROSCOPIC CHOLECYSTECTOMY WITH INTRAOPERATIVE CHOLANGIOGRAM;  Surgeon: Chevis Pretty III, MD;  Location: [facility];  Service: General;  Laterality: N/A;  . CYSTECTOMY     Social History   Occupational History  . Occupation: STUDENT    Comment: [employer]  Tobacco Use  . Smoking status: Never Smoker  . Smokeless tobacco: Never Used  Substance and Sexual Activity  . Alcohol use: No  . Drug use: No  . Sexual activity: Yes    Partners: Male    Birth control/protection: None    Comment: pregnant

## 2020-08-25 ENCOUNTER — Encounter: Payer: Self-pay | Admitting: Family

## 2020-09-12 ENCOUNTER — Ambulatory Visit
Admission: RE | Admit: 2020-09-12 | Discharge: 2020-09-12 | Disposition: A | Discharge status: Home / Self Care | Payer: 59 | Source: Ambulatory Visit | Attending: Physician Assistant | Admitting: Physician Assistant

## 2020-09-12 ENCOUNTER — Other Ambulatory Visit: Payer: Self-pay

## 2020-09-12 DIAGNOSIS — M7542 Impingement syndrome of left shoulder: Secondary | ICD-10-CM

## 2020-09-16 ENCOUNTER — Encounter: Payer: Self-pay | Admitting: Physician Assistant

## 2020-09-16 ENCOUNTER — Other Ambulatory Visit: Payer: Self-pay | Admitting: Radiology

## 2020-09-16 ENCOUNTER — Ambulatory Visit (INDEPENDENT_AMBULATORY_CARE_PROVIDER_SITE_OTHER): Payer: 59 | Admitting: Physician Assistant

## 2020-09-16 DIAGNOSIS — M7502 Adhesive capsulitis of left shoulder: Secondary | ICD-10-CM

## 2020-09-16 DIAGNOSIS — M7542 Impingement syndrome of left shoulder: Secondary | ICD-10-CM | POA: Diagnosis not present

## 2020-09-16 MED ORDER — METHYLPREDNISOLONE ACETATE 40 MG/ML IJ SUSP
40.0000 mg | INTRAMUSCULAR | Status: AC | PRN
  Administered 2020-09-16: 40 mg via INTRA_ARTICULAR

## 2020-09-16 MED ORDER — LIDOCAINE HCL 1 % IJ SOLN
3.0000 mL | INTRAMUSCULAR | Status: AC | PRN
  Administered 2020-09-16: 3 mL

## 2020-09-16 NOTE — Progress Notes (Addendum)
HPI: Gabriela Le 44 year old female returns today to go over the MRI of her left shoulder.  She presents today with her husband.  Patient continues to have decreased range of motion of the left shoulder and pain in the left shoulder. MRI images are reviewed with the patient and her husband.  MRI shows the glenohumeral joint to be well maintained.  She has mild tendinitis of the supraspinatus tendon and infraspinatus tendon.  Small partial-thickness bursal surface tear anteriorly.  Moderate subacromial subdeltoid bursitis.  Type II acromion.   Physical exam: Left shoulder forward flexion 90 degrees actively passively I bring her 110 degrees after injection I can bring her to 180 degrees.  Positive impingement testing on the left negative on the right.  Otherwise with the elbow at chest level internal and external rotation reveals fluid motion.         .  Procedure Note  Patient: Gabriela Le             Date of Birth: Sep 11, 1976           MRN: [id number]             Visit Date: 09/16/2020  Procedures: Visit Diagnoses:  1. Shoulder impingement syndrome, left     Large Joint Inj: L subacromial bursa on 09/16/2020 12:01 PM Indications: pain Details: 22 G 1.5 in needle, superior approach  Arthrogram: No  Medications: 3 mL lidocaine 1 %; 40 mg methylPREDNISolone acetate 40 MG/ML Outcome: tolerated well, no immediate complications Procedure, treatment alternatives, risks and benefits explained, specific risks discussed. Consent was given by the patient. Immediately prior to procedure a time out was called to verify the correct patient, procedure, equipment, support staff and site/side marked as required. Patient was prepped and draped in the usual sterile fashion.     Plan: We will send her to formal physical therapy to work on range of motion strengthening of the left shoulder.  Discussed with her and her husband that if she continues to have pain in the shoulder despite conservative measures  and may recommend left shoulder arthroscopy with extensive debridement and subacromial decompression.  They have like to proceed with these conservative measures for now.  She will follow-up with [place] in 6 weeks.

## 2020-09-25 ENCOUNTER — Encounter: Payer: Self-pay | Admitting: Family

## 2020-09-27 ENCOUNTER — Other Ambulatory Visit: Payer: Self-pay | Admitting: Family

## 2020-09-27 MED ORDER — FEXOFENADINE HCL 180 MG PO TABS: 180.0000 mg | ORAL_TABLET | Freq: Every day | ORAL | 3 refills | Status: DC

## 2020-10-01 ENCOUNTER — Encounter: Payer: Self-pay | Admitting: Physical Therapy

## 2020-10-01 ENCOUNTER — Ambulatory Visit: Payer: 59 | Attending: Physician Assistant | Admitting: Physical Therapy

## 2020-10-01 ENCOUNTER — Other Ambulatory Visit: Payer: Self-pay

## 2020-10-01 DIAGNOSIS — R293 Abnormal posture: Secondary | ICD-10-CM | POA: Diagnosis present

## 2020-10-01 DIAGNOSIS — R29898 Other symptoms and signs involving the musculoskeletal system: Secondary | ICD-10-CM | POA: Diagnosis present

## 2020-10-01 DIAGNOSIS — M25512 Pain in left shoulder: Secondary | ICD-10-CM | POA: Insufficient documentation

## 2020-10-01 DIAGNOSIS — G8929 Other chronic pain: Secondary | ICD-10-CM | POA: Insufficient documentation

## 2020-10-01 DIAGNOSIS — M6281 Muscle weakness (generalized): Secondary | ICD-10-CM | POA: Diagnosis present

## 2020-10-01 DIAGNOSIS — M25612 Stiffness of left shoulder, not elsewhere classified: Secondary | ICD-10-CM | POA: Diagnosis present

## 2020-10-01 NOTE — Patient Instructions (Signed)
    Home exercise program created by Deissy Guilbert, PT.  For questions, please contact Abagail Limb via phone at [phone] or email at [email or web address]  [facility] [place] [address]  [address] [place], [place], [address] Phone: [phone]   Fax:  [phone]    

## 2020-10-01 NOTE — Therapy (Signed)
[facility] [address]  [address] [place], [place], [address] Phone: [phone]   Fax:  [phone]  Physical Therapy Evaluation  Patient Details  Name: Gabriela Le MRN: [id number] Date of Birth: 02-Aug-1976 Referring Provider (PT): Richardean Canal, PA-C   Encounter Date: 10/01/2020   PT End of Session - 10/01/20 0933    Visit Number 1    Number of Visits 16    Date for PT Re-Evaluation 11/26/20    Authorization Type [employer] (VL = 30) & Medicaid UHC    PT Start Time [phone]    PT Stop Time 1020    PT Time Calculation (min) 47 min    Activity Tolerance Patient tolerated treatment well;Patient limited by pain    Behavior During Therapy [facility] for tasks assessed/performed           Past Medical History:  Diagnosis Date  . GERD (gastroesophageal reflux disease)   . H/O cold sores    ACYCLOVIR OINTMENT PRN  . Infertility associated with anovulation    HAS TAKEN CLOMID IN THE PAST  . Irregular menses   . Missed ab 12/29/2010   no surgery required  . 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 APPENDECTOMY     6 YOA  . CESAREAN SECTION  01/06/2013   Procedure: CESAREAN SECTION;  Surgeon: Hal Morales, MD;  Location: [facility];  Service: Obstetrics;  Laterality: N/A;  Primary  . CESAREAN SECTION N/A 10/09/2014   Procedure: CESAREAN SECTION;  Surgeon: Konrad Felix, MD;  Location: [facility];  Service: Obstetrics;  Laterality: N/A;  . CESAREAN SECTION N/A 08/09/2017   Procedure: REPEAT CESAREAN SECTION;  Surgeon: Willodean Rosenthal, MD;  Location: [facility]IRTHING SU[facility];  Service: Obstetrics;  Laterality: N/A;  . CHOLECYSTECTOMY N/A 11/27/2014   Procedure: LAPAROSCOPIC CHOLECYSTECTOMY WITH INTRAOPERATIVE CHOLANGIOGRAM;  Surgeon: Chevis Pretty III, MD;  Location: [facility];  Service: General;  Laterality: N/A;  . CYSTECTOMY      There were no vitals filed for this visit.    Subjective  Assessment - 10/01/20 0942    Subjective Pt reports current L shoulder pain starting >1.5 yrs ago, but notes episode of pain ~6 yrs ago when carrying 1st child around alot - resolved on its own at the time. Pain intially intermittent but now more constant with any movement. Wants to avoid surgery.    Patient is accompained by: Family member   husband - assisting with interpretation & answering questions   Diagnostic tests 09/12/20 - L shoulder MRI: 1. Mild tendinosis of the supraspinatus tendon with a small partial-thickness bursal surface tear anteriorly. 2. Mild tendinosis of the infraspinatus tendon. 3. Moderate subacromial/subdeltoid bursitis.    Patient Stated Goals "to make my shoulder get back to normal"    Currently in Pain? Yes    Pain Score 0-No pain   0/10 at rest, 8/10 with movement, >10/10 with certain positions   Pain Location Shoulder    Pain Orientation Left;Lateral    Pain Descriptors / Indicators Stabbing    Pain Type Chronic pain    Pain Radiating Towards intermittent pain & cold sensation down L arm to fingers    Pain Onset Other (comment)   >1.5 yrs ago   Pain Frequency Intermittent    Aggravating Factors  raising arm overhead or out to side    Pain Relieving Factors uncertain    Effect of Pain on Daily Activities  unable to use her L arm with most daily tasks              [facility] PT Assessment - 10/01/20 0933      Assessment   Medical Diagnosis Chronic L shoulder pain 2 impingement syndrome & adhesive capsulitis    Referring Provider (PT) Richardean Canal, PA-C    Onset Date/Surgical Date --   >1.5 yrs   Hand Dominance Left    Next MD Visit 11/04/20    Prior Therapy none      Precautions   Precautions None      Restrictions   Weight Bearing Restrictions No      Balance Screen   Has the patient fallen in the past 6 months No    Has the patient had a decrease in activity level because of a fear of falling?  No    Is the patient reluctant to leave their home  because of a fear of falling?  No      Home Environment   Living Environment Private residence    Living Arrangements Spouse/significant other;Children    Available Help at Discharge Family      Prior Function   Level of Independence Independent    Vocation Part time employment    Vocation Requirements works weekends - mostly standing - limited use of shoulder    Leisure take care of 3 kids      Posture/Postural Control   Posture/Postural Control Postural limitations    Postural Limitations Rounded Shoulders;Forward head      ROM / Strength   AROM / PROM / Strength AROM;PROM;Strength      AROM   AROM Assessment Site Shoulder    Right/Left Shoulder Right;Left    Right Shoulder Flexion 162 Degrees    Right Shoulder ABduction 172 Degrees    Left Shoulder Flexion 56 Degrees    Left Shoulder ABduction 54 Degrees    Left Shoulder Internal Rotation 38 Degrees    Left Shoulder External Rotation 47 Degrees      PROM   PROM Assessment Site Shoulder    Right/Left Shoulder Left    Left Shoulder Flexion 104 Degrees    Left Shoulder ABduction 55 Degrees    Left Shoulder Internal Rotation 66 Degrees    Left Shoulder External Rotation 51 Degrees      Strength   Strength Assessment Site Shoulder    Right/Left Shoulder Right;Left    Right Shoulder Flexion 4+/5    Right Shoulder ABduction 4+/5    Right Shoulder Internal Rotation 4+/5    Right Shoulder External Rotation 4+/5    Left Shoulder Flexion 2-/5    Left Shoulder ABduction 2-/5    Left Shoulder Internal Rotation 2-/5    Left Shoulder External Rotation 2-/5      Palpation   Palpation comment ttp throughout posterior RTC musculature, UT/LS, deltoids and lateral pecs                      Objective measurements completed on examination: See above findings.       [facility] Adult PT Treatment/Exercise - 10/01/20 0933      Exercises   Exercises Shoulder      Shoulder Exercises: Seated   Flexion Left;10  reps;AAROM    Flexion Limitations table slides      Shoulder Exercises: ROM/Strengthening   Pendulum L shoulder fwd/back, side/side, CW/CW x 30 sec each  PT Education - 10/01/20 1015    Education Details PT eval findings, anticipated POC & initial HEP    Person(s) Educated Patient    Methods Explanation;Demonstration;Verbal cues;Tactile cues;Handout    Comprehension Verbalized understanding;Verbal cues required;Tactile cues required;Returned demonstration;Need further instruction            PT Short Term Goals - 10/01/20 1020      PT SHORT TERM GOAL #1   Title Patient will be independent with initial HEP    Status New    Target Date 10/22/20      PT SHORT TERM GOAL #2   Title Patient to improve L shoulder flexion & abduction P/AAROM to >/= 120 dg without pain provocation    Status New    Target Date 10/29/20             PT Long Term Goals - 10/01/20 1020      PT LONG TERM GOAL #1   Title Patient will be independent with ongoing/advanced HEP    Status New    Target Date 11/26/20      PT LONG TERM GOAL #2   Title Patient to improve L shoulder AROM to [facility] without pain provocation    Status New    Target Date 11/26/20      PT LONG TERM GOAL #3   Title Patient will demonstrate improved L shoulder strength to >/= 4/5 for functional UE use    Status New    Target Date 11/26/20      PT LONG TERM GOAL #4   Title Patient to demonstrate appropriate posture and body mechanics needed for daily activities    Status New    Target Date 11/26/20      PT LONG TERM GOAL #5   Title Patient to report ability to perform ADLs, household and work-related tasks without increased L shoulder pain    Status New    Target Date 11/26/20                  Plan - 10/01/20 1020    Clinical Impression Statement Aiana is a 44 y/o female who presents to OP PT with >1.5 yr h/o chronic L shoulder pain attributed to impingement syndrome and adhesive capsulitis.   Recent MRI revealing mild tendinosis of the supraspinatus and infraspinatus tendons with a small partial-thickness bursal surface tear anteriorly at the supraspinatus tendon and moderate subacromial/subdeltoid bursitis. Patient reports pain initially intermittent but now nearly constant with any motion of L arm. Deficits include forward rounded shoulder posture (L>R) with protracted L scapula; ttp throughout posterior RTC musculature, UT/LS, deltoids and lateral pecs; severely limited AROM and PROM of L shoulder; and pronounced shoulder/RTC weakness. Sheena will benefit from skilled PT to address above deficits and restore functional ROM and strength to allow her to resume normal daily activities without pain interference.    Personal Factors and Comorbidities Time since onset of injury/illness/exacerbation;Past/Current Experience;Age;Fitness    Examination-Activity Limitations Bathing;Caring for Others;Hygiene/Grooming;Dressing;Lift;Carry;Reach Overhead    Examination-Participation Restrictions Cleaning;Community Activity;Driving;Interpersonal Relationship;Laundry;Meal Prep;Occupation;Shop    Stability/Clinical Decision Making Stable/Uncomplicated    Clinical Decision Making Low    Rehab Potential Good    PT Frequency 2x / week    PT Duration 8 weeks    PT Treatment/Interventions ADLs/Self Care Home Management;Cryotherapy;Electrical Stimulation;Iontophoresis 4mg /ml Dexamethasone;Moist Heat;Ultrasound;Therapeutic activities;Therapeutic exercise;Neuromuscular re-education;Patient/family education;Manual techniques;Passive range of motion;Dry needling;Taping;Vasopneumatic Device;Joint Manipulations    PT Next Visit Plan review initial HEP; L shoulder P/AAROM to tolerance; postural stabilization/strengthening; manual therapy to address  pain and increased muscle tension; modalities PRN for pain    PT Home Exercise Plan 10/5 - pendulums, flexion/scaption P/AAROM table slides    Consulted and Agree with Plan of  Care Patient;Family member/caregiver    Family Member Consulted husband           Patient will benefit from skilled therapeutic intervention in order to improve the following deficits and impairments:  Decreased activity tolerance, Decreased range of motion, Decreased strength, Increased muscle spasms, Impaired perceived functional ability, Impaired flexibility, Impaired UE functional use, Improper body mechanics, Postural dysfunction, Pain  Visit Diagnosis: Chronic left shoulder pain  Stiffness of left shoulder, not elsewhere classified  Muscle weakness (generalized)  Abnormal posture  Other symptoms and signs involving the musculoskeletal system     Problem List Patient Active Problem List   Diagnosis Date Noted  . Status post repeat low transverse cesarean section 08/12/2017  . Late prenatal care in third trimester 06/23/2017  . Supervision of high risk pregnancy, antepartum 02/15/2017  . Advanced maternal age in multigravida, unspecified trimester 02/15/2017  . Non-English speaking patient 02/15/2017  . Previous cesarean delivery, antepartum condition or complication 10/06/2014  . 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

## 2020-10-07 ENCOUNTER — Other Ambulatory Visit: Payer: Self-pay

## 2020-10-07 ENCOUNTER — Ambulatory Visit: Payer: 59

## 2020-10-07 DIAGNOSIS — M25612 Stiffness of left shoulder, not elsewhere classified: Secondary | ICD-10-CM

## 2020-10-07 DIAGNOSIS — R29898 Other symptoms and signs involving the musculoskeletal system: Secondary | ICD-10-CM

## 2020-10-07 DIAGNOSIS — M6281 Muscle weakness (generalized): Secondary | ICD-10-CM

## 2020-10-07 DIAGNOSIS — G8929 Other chronic pain: Secondary | ICD-10-CM

## 2020-10-07 DIAGNOSIS — M25512 Pain in left shoulder: Secondary | ICD-10-CM | POA: Diagnosis not present

## 2020-10-07 DIAGNOSIS — R293 Abnormal posture: Secondary | ICD-10-CM

## 2020-10-07 NOTE — Therapy (Signed)
[facility] [address]  [address] [place], [place], [address] Phone: [phone]   Fax:  [phone]  Physical Therapy Treatment  Patient Details  Name: Gabriela Le MRN: [id number] Date of Birth: 11-03-1976 Referring Provider (PT): Richardean Canal, PA-C   Encounter Date: 10/07/2020   PT End of Session - 10/07/20 0945    Visit Number 2    Number of Visits 16    Date for PT Re-Evaluation 11/26/20    Authorization Type [employer] (VL = 30) & Medicaid UHC    PT Start Time 0932    PT Stop Time 1015    PT Time Calculation (min) 43 min    Activity Tolerance Patient tolerated treatment well;Patient limited by pain    Behavior During Therapy [facility] for tasks assessed/performed           Past Medical History:  Diagnosis Date  . GERD (gastroesophageal reflux disease)   . H/O cold sores    ACYCLOVIR OINTMENT PRN  . Infertility associated with anovulation    HAS TAKEN CLOMID IN THE PAST  . Irregular menses   . Missed ab 12/29/2010   no surgery required  . 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 APPENDECTOMY     6 YOA  . CESAREAN SECTION  01/06/2013   Procedure: CESAREAN SECTION;  Surgeon: Hal Morales, MD;  Location: [facility];  Service: Obstetrics;  Laterality: N/A;  Primary  . CESAREAN SECTION N/A 10/09/2014   Procedure: CESAREAN SECTION;  Surgeon: Konrad Felix, MD;  Location: [facility];  Service: Obstetrics;  Laterality: N/A;  . CESAREAN SECTION N/A 08/09/2017   Procedure: REPEAT CESAREAN SECTION;  Surgeon: Willodean Rosenthal, MD;  Location: [facility] BIRTHING SUITES;  Service: Obstetrics;  Laterality: N/A;  . CHOLECYSTECTOMY N/A 11/27/2014   Procedure: LAPAROSCOPIC CHOLECYSTECTOMY WITH INTRAOPERATIVE CHOLANGIOGRAM;  Surgeon: Chevis Pretty III, MD;  Location: [facility];  Service: General;  Laterality: N/A;  . CYSTECTOMY      There were no vitals filed for this visit.   Subjective  Assessment - 10/07/20 0943    Subjective Pt. doing ok.  Notes some difficulty with the pendulums exercise at home.    Diagnostic tests 09/12/20 - L shoulder MRI: 1. Mild tendinosis of the supraspinatus tendon with a small partial-thickness bursal surface tear anteriorly. 2. Mild tendinosis of the infraspinatus tendon. 3. Moderate subacromial/subdeltoid bursitis.    Patient Stated Goals "to make my shoulder get back to normal"    Currently in Pain? Yes    Pain Score 4     Pain Location Shoulder    Pain Orientation Left;Lateral    Pain Descriptors / Indicators Stabbing    Pain Type Chronic pain    Pain Frequency Intermittent    Multiple Pain Sites No                             [facility] Adult PT Treatment/Exercise - 10/07/20 0001      Self-Care   Self-Care Other Self-Care Comments    Other Self-Care Comments  Instruction on tennis ball self-massage to L posterior shoulder tender musculature       Shoulder Exercises: Supine   Flexion Left;AAROM;10 reps    Flexion Limitations wand; cues to avoid painful arc      Shoulder Exercises: Seated   Retraction Both;10 reps    Retraction Limitations 5" hold scapular retraction  Flexion Left;10 reps;AAROM    Flexion Limitations table slides    Abduction Left;10 reps;AAROM    ABduction Limitations table slide scaption       Shoulder Exercises: Pulleys   Flexion 3 minutes    Flexion Limitations cues to avoid painful arc    Scaption 3 minutes    Scaption Limitations cues to avoid painful arc      Shoulder Exercises: Stretch   Other Shoulder Stretches L UT, LS stretch x 30 sec                   PT Education - 10/07/20 1007    Education Details --            PT Short Term Goals - 10/07/20 0945      PT SHORT TERM GOAL #1   Title Patient will be independent with initial HEP    Status On-going    Target Date 10/22/20      PT SHORT TERM GOAL #2   Title Patient to improve L shoulder flexion & abduction  P/AAROM to >/= 120 dg without pain provocation    Status On-going    Target Date 10/29/20             PT Long Term Goals - 10/07/20 0945      PT LONG TERM GOAL #1   Title Patient will be independent with ongoing/advanced HEP    Status On-going      PT LONG TERM GOAL #2   Title Patient to improve L shoulder AROM to [facility] without pain provocation    Status On-going      PT LONG TERM GOAL #3   Title Patient will demonstrate improved L shoulder strength to >/= 4/5 for functional UE use    Status On-going      PT LONG TERM GOAL #4   Title Patient to demonstrate appropriate posture and body mechanics needed for daily activities    Status On-going      PT LONG TERM GOAL #5   Title Patient to report ability to perform ADLs, household and work-related tasks without increased L shoulder pain    Status On-going                 Plan - 10/07/20 0946    Clinical Impression Statement Gabriela Le reporting some difficulty with shoulder pendulums HEP activities thus reviewed these activities to start session.  Minor cueing required with pendulums to ensure initiation of shoulder motion from hips with good carryover.  Pt. continues to note L shoulder pain with shoulder height motions and shoulder somewhat irritable today with therex thus worked on conservative ROM activities.  Instructed pt. with in tennis ball self-massage to posterior shoulder musculature with good tolerance thus HEP updated accordingly.  Ended visit with pt. noting pain thus deferred modalities.    Rehab Potential Good    PT Frequency 2x / week    PT Duration 8 weeks    PT Treatment/Interventions ADLs/Self Care Home Management;Cryotherapy;Electrical Stimulation;Iontophoresis 4mg /ml Dexamethasone;Moist Heat;Ultrasound;Therapeutic activities;Therapeutic exercise;Neuromuscular re-education;Patient/family education;Manual techniques;Passive range of motion;Dry needling;Taping;Vasopneumatic Device;Joint Manipulations    PT Next  Visit Plan L shoulder P/AAROM to tolerance; postural stabilization/strengthening; manual therapy to address pain and increased muscle tension; modalities PRN for pain    PT Home Exercise Plan 10/5 - pendulums, flexion/scaption P/AAROM table slides    Consulted and Agree with Plan of Care Patient           Patient will benefit from skilled therapeutic intervention  in order to improve the following deficits and impairments:  Decreased activity tolerance, Decreased range of motion, Decreased strength, Increased muscle spasms, Impaired perceived functional ability, Impaired flexibility, Impaired UE functional use, Improper body mechanics, Postural dysfunction, Pain  Visit Diagnosis: Chronic left shoulder pain  Stiffness of left shoulder, not elsewhere classified  Muscle weakness (generalized)  Abnormal posture  Other symptoms and signs involving the musculoskeletal system     Problem List Patient Active Problem List   Diagnosis Date Noted  . Status post repeat low transverse cesarean section 08/12/2017  . Late prenatal care in third trimester 06/23/2017  . Supervision of high risk pregnancy, antepartum 02/15/2017  . Advanced maternal age in multigravida, unspecified trimester 02/15/2017  . Non-English speaking patient 02/15/2017  . Previous cesarean delivery, antepartum condition or complication 10/06/2014  . 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  [address] [place], [place], [address] Phone: [phone]   Fax:  [phone]  Name: Gabriela Le MRN: [id number] Date of Birth: 04-04-1976

## 2020-10-10 ENCOUNTER — Other Ambulatory Visit: Payer: Self-pay

## 2020-10-10 ENCOUNTER — Ambulatory Visit: Payer: 59

## 2020-10-10 DIAGNOSIS — M25512 Pain in left shoulder: Secondary | ICD-10-CM | POA: Diagnosis not present

## 2020-10-10 DIAGNOSIS — G8929 Other chronic pain: Secondary | ICD-10-CM

## 2020-10-10 DIAGNOSIS — R293 Abnormal posture: Secondary | ICD-10-CM

## 2020-10-10 DIAGNOSIS — M25612 Stiffness of left shoulder, not elsewhere classified: Secondary | ICD-10-CM

## 2020-10-10 DIAGNOSIS — R29898 Other symptoms and signs involving the musculoskeletal system: Secondary | ICD-10-CM

## 2020-10-10 DIAGNOSIS — M6281 Muscle weakness (generalized): Secondary | ICD-10-CM

## 2020-10-10 NOTE — Therapy (Signed)
[facility] [address]  [address] [place], [place], [address] Phone: [phone]   Fax:  [phone]  Physical Therapy Treatment  Patient Details  Name: Gabriela Le MRN: [id number] Date of Birth: 29-May-1976 Referring Provider (PT): Richardean Canal, PA-C   Encounter Date: 10/10/2020   PT End of Session - 10/10/20 0939    Visit Number 3    Number of Visits 16    Date for PT Re-Evaluation 11/26/20    Authorization Type [employer] (VL = 30) & Medicaid [employer]    PT Start Time 0932    PT Stop Time 1015    PT Time Calculation (min) 43 min    Activity Tolerance Patient tolerated treatment well;Patient limited by pain    Behavior During Therapy [facility] for tasks assessed/performed           Past Medical History:  Diagnosis Date  . GERD (gastroesophageal reflux disease)   . H/O cold sores    ACYCLOVIR OINTMENT PRN  . Infertility associated with anovulation    HAS TAKEN CLOMID IN THE PAST  . Irregular menses   . Missed ab 12/29/2010   no surgery required  . 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 APPENDECTOMY     6 YOA  . CESAREAN SECTION  01/06/2013   Procedure: CESAREAN SECTION;  Surgeon: Hal Morales, MD;  Location: [facility];  Service: Obstetrics;  Laterality: N/A;  Primary  . CESAREAN SECTION N/A 10/09/2014   Procedure: CESAREAN SECTION;  Surgeon: Konrad Felix, MD;  Location: [facility];  Service: Obstetrics;  Laterality: N/A;  . CESAREAN SECTION N/A 08/09/2017   Procedure: REPEAT CESAREAN SECTION;  Surgeon: Willodean Rosenthal, MD;  Location: [facility] BIRTHING SUIT[facility];  Service: Obstetrics;  Laterality: N/A;  . CHOLECYSTECTOMY N/A 11/27/2014   Procedure: LAPAROSCOPIC CHOLECYSTECTOMY WITH INTRAOPERATIVE CHOLANGIOGRAM;  Surgeon: Chevis Pretty III, MD;  Location: [facility];  Service: General;  Laterality: N/A;  . CYSTECTOMY      There were no vitals filed for this visit.   Subjective  Assessment - 10/10/20 0938    Subjective Gabriela Le reporting L shoulder bothering her more after heavy work day yesterday.    Diagnostic tests 09/12/20 - L shoulder MRI: 1. Mild tendinosis of the supraspinatus tendon with a small partial-thickness bursal surface tear anteriorly. 2. Mild tendinosis of the infraspinatus tendon. 3. Moderate subacromial/subdeltoid bursitis.    Patient Stated Goals "to make my shoulder get back to normal"    Currently in Pain? 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 & adhesive capsulitis    Referring Provider (PT) Richardean Canal, PA-C    Hand Dominance Left    Next MD Visit 11/04/20    Prior Therapy none                         [facility] Adult PT Treatment/Exercise - 10/10/20 0001      Self-Care   Self-Care Other Self-Care Comments    Other Self-Care Comments  Instruction on  tennis ball self-massage to L posterior shoulder tender musculature       Shoulder Exercises: Supine   Flexion Left;AAROM;15 reps    Flexion Limitations wand; cues to avoid painful arc   sensation of "stopping" at ~ 90-100 dg    ABduction Left;AAROM;10 reps      Shoulder Exercises: Standing   Row Both;10 reps;Strengthening;Theraband    Theraband Level (Shoulder Row) Level 1 (Yellow)    Row Limitations good technique after cueing for scapular retraction/depression     Retraction Both;10 reps    Retraction Limitations 5" hold for scapular retraction       Shoulder Exercises: Pulleys   Flexion 3 minutes    Flexion Limitations cues to avoid painful arc    Scaption 3 minutes    Scaption Limitations cues to avoid painful arc      Manual Therapy   Manual Therapy Passive ROM;Soft tissue  mobilization;Myofascial release;Scapular mobilization    Manual therapy comments supine     Soft tissue mobilization STM to L posterior shoulder, deltoid, L UT, LS, rhomboids - ttp throughout and increased tension especially in L UT    Myofascial Release TPR to L UT, infra    Scapular Mobilization L scapular glides all directions - limited ROM all directions likely due to muscular guarding     Passive ROM L shoulder PROM all directions to pt. tolerance    pt. with heavy muscular guarding difficulty relaxing                  PT Education - 10/10/20 1134    Education Details HEP update: (in arabic and english instruction); yellow TB row standing, L posterior shoulder tennis ball massage on wall    Person(s) Educated Patient    Methods Explanation;Demonstration;Verbal cues;Handout    Comprehension Verbalized understanding;Returned demonstration;Verbal cues required            PT Short Term Goals - 10/07/20 0945      PT SHORT TERM GOAL #1   Title Patient will be independent with initial HEP    Status On-going    Target Date 10/22/20      PT SHORT TERM GOAL #2   Title Patient to improve L shoulder flexion & abduction P/AAROM to >/= 120 dg without pain provocation    Status On-going    Target Date 10/29/20             PT Long Term Goals - 10/07/20 0945      PT LONG TERM GOAL #1   Title Patient will be independent with ongoing/advanced HEP    Status On-going      PT LONG TERM GOAL #2   Title Patient to improve L shoulder AROM to [facility] without pain provocation    Status On-going      PT LONG TERM GOAL #3   Title Patient will demonstrate improved L shoulder strength to >/= 4/5 for functional UE use    Status On-going      PT LONG TERM GOAL #4   Title Patient to demonstrate appropriate posture and body mechanics needed for daily activities    Status On-going      PT LONG TERM GOAL #5   Title Patient to report ability to perform ADLs, household and work-related tasks  without increased L shoulder pain    Status On-going                 Plan - 10/10/20 0940    Clinical Impression Statement Gabriela Le  reporting heavy physical requirements at work yesterday (8 hour shift) which required her to carry items most of the day which irritated L shoulder.  MT revealing heavy muscular guarding in UT, LS, posterior shoulder musculature which improve some with STM/TPR.  Duration of session focused on gentle AAROM and Manual PROM with therapist to improved ROM and reduce muscular guarding.  Pt. tolerated tennis ball release on wall to posterior L shoulder and yellow TB well thus updated HEP accordingly (see pt. education section).    Rehab Potential Good    PT Treatment/Interventions ADLs/Self Care Home Management;Cryotherapy;Electrical Stimulation;Iontophoresis 4mg /ml Dexamethasone;Moist Heat;Ultrasound;Therapeutic activities;Therapeutic exercise;Neuromuscular re-education;Patient/family education;Manual techniques;Passive range of motion;Dry needling;Taping;Vasopneumatic Device;Joint Manipulations    PT Next Visit Plan L shoulder P/AAROM to tolerance; postural stabilization/strengthening; manual therapy to address pain and increased muscle tension; modalities PRN for pain    PT Home Exercise Plan 10/5 - pendulums, flexion/scaption P/AAROM table slides, tennis ball massage to infra, yellow TB row    Consulted and Agree with Plan of Care Patient           Patient will benefit from skilled therapeutic intervention in order to improve the following deficits and impairments:  Decreased activity tolerance, Decreased range of motion, Decreased strength, Increased muscle spasms, Impaired perceived functional ability, Impaired flexibility, Impaired UE functional use, Improper body mechanics, Postural dysfunction, Pain  Visit Diagnosis: Chronic left shoulder pain  Stiffness of left shoulder, not elsewhere classified  Muscle weakness (generalized)  Abnormal posture  Other  symptoms and signs involving the musculoskeletal system     Problem List Patient Active Problem List   Diagnosis Date Noted  . Status post repeat low transverse cesarean section 08/12/2017  . Late prenatal care in third trimester 06/23/2017  . Supervision of high risk pregnancy, antepartum 02/15/2017  . Advanced maternal age in multigravida, unspecified trimester 02/15/2017  . Non-English speaking patient 02/15/2017  . Previous cesarean delivery, antepartum condition or complication 10/06/2014  . 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

## 2020-10-14 ENCOUNTER — Ambulatory Visit: Payer: 59 | Admitting: Physical Therapy

## 2020-10-14 ENCOUNTER — Other Ambulatory Visit: Payer: Self-pay

## 2020-10-14 ENCOUNTER — Encounter: Payer: Self-pay | Admitting: Physical Therapy

## 2020-10-14 DIAGNOSIS — G8929 Other chronic pain: Secondary | ICD-10-CM

## 2020-10-14 DIAGNOSIS — M25612 Stiffness of left shoulder, not elsewhere classified: Secondary | ICD-10-CM

## 2020-10-14 DIAGNOSIS — R29898 Other symptoms and signs involving the musculoskeletal system: Secondary | ICD-10-CM

## 2020-10-14 DIAGNOSIS — M25512 Pain in left shoulder: Secondary | ICD-10-CM | POA: Diagnosis not present

## 2020-10-14 DIAGNOSIS — R293 Abnormal posture: Secondary | ICD-10-CM

## 2020-10-14 DIAGNOSIS — M6281 Muscle weakness (generalized): Secondary | ICD-10-CM

## 2020-10-14 NOTE — Therapy (Signed)
[facility]patient Rehabilitation [facility] [address]  [address] [place], [place], [address] Phone: [phone]   Fax:  [phone]  Physical Therapy Treatment  Patient Details  Name: Gabriela Le MRN: [id number] Date of Birth: [birth year] Referring Provider (PT): Richardean Canal, PA-C   Encounter Date: 10/14/2020   PT End of Session - 10/14/20 0933    Visit Number 4    Number of Visits 16    Date for PT Re-Evaluation 11/26/20    Authorization Type [employer] (VL = 30) & Medicaid [employer]    PT Start Time [phone]    PT Stop Time 1014    PT Time Calculation (min) 41 min    Activity Tolerance Patient tolerated treatment well    Behavior During Therapy [facility] for tasks assessed/performed           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    Past Surgical History:  Procedure Laterality Date   APPENDECTOMY     6 YOA   CESAREAN SECTION  01/06/2013   Procedure: CESAREAN SECTION;  Surgeon: Hal Morales, MD;  Location: [facility];  Service: Obstetrics;  Laterality: N/A;  Primary   CESAREAN SECTION N/A 10/09/2014   Procedure: CESAREAN SECTION;  Surgeon: Konrad Felix, MD;  Location: [facility];  Service: Obstetrics;  Laterality: N/A;   CESAREAN SECTION N/A 08/09/2017   Procedure: REPEAT CESAREAN SECTION;  Surgeon: Willodean Rosenthal, MD;  Location: [facility] BIRTHING SUIT[facility];  Service: Obstetrics;  Laterality: N/A;   CHOLECYSTECTOMY N/A 11/27/2014   Procedure: LAPAROSCOPIC CHOLECYSTECTOMY WITH INTRAOPERATIVE CHOLANGIOGRAM;  Surgeon: Chevis Pretty III, MD;  Location: [facility];  Service: General;  Laterality: N/A;   CYSTECTOMY      There were no vitals filed for this visit.   Subjective Assessment - 10/14/20 0935     Subjective Pt reporting pain only with movement today, but up to 8/10 when present (short-lived).    Diagnostic tests 09/12/20 - L shoulder MRI: 1. Mild tendinosis of the supraspinatus tendon with a small partial-thickness bursal surface tear anteriorly. 2. Mild tendinosis of the infraspinatus tendon. 3. Moderate subacromial/subdeltoid bursitis.    Patient Stated Goals "to make my shoulder get back to normal"    Currently in Pain? Yes    Pain Score 0-No pain   up to 8/10 with some movements   Pain Location Shoulder    Pain Orientation Left    Pain Descriptors / Indicators Burning;Numbness    Pain Type Chronic pain    Pain Frequency Intermittent                             [facility] Adult PT Treatment/Exercise - 10/14/20 0933      Exercises   Exercises Shoulder      Shoulder Exercises: Seated   Retraction Both;10 reps    Retraction Limitations 5" hold - scapular retraction & depression    External Rotation Both;10 reps;AROM    External Rotation Limitations + scap retraction      Shoulder Exercises: Sidelying   External Rotation Left;10 reps;AROM      Shoulder Exercises: Therapy Ball   Flexion Both;10 reps  Flexion Limitations seated green Pball rollouts   pt noting "catch" when attempting to roll back      Shoulder Exercises: Lawyer 30 seconds;1 rep    Corner Stretch Limitations low doorway pec stretch    Other Shoulder Stretches L UT & LS stretch 2 x 30 sec     Other Shoulder Stretches B seated rhomboid stretch 2 x 30 sec      Manual Therapy   Manual Therapy Passive ROM;Soft tissue mobilization;Myofascial release;Scapular mobilization    Soft tissue mobilization STM/DTM to L UT, LS, rhomboid, infraspinatus & teres group as well as anterolateral deltoid - ttp throughout and increased tension especially in L UT    Myofascial Release manual TPR to L UT, LS, rhomboids & infraspinatus    Scapular Mobilization L scapular mobs - all directions -  signficant restriction noted    Passive ROM L shoulder PROM all directions - pt noting painful "catch" or "locking" of her shoulder when lowering from flexion/scaption                    PT Short Term Goals - 10/07/20 0945      PT SHORT TERM GOAL #1   Title Patient will be independent with initial HEP    Status On-going    Target Date 10/22/20      PT SHORT TERM GOAL #2   Title Patient to improve L shoulder flexion & abduction P/AAROM to >/= 120 dg without pain provocation    Status On-going    Target Date 10/29/20             PT Long Term Goals - 10/07/20 0945      PT LONG TERM GOAL #1   Title Patient will be independent with ongoing/advanced HEP    Status On-going      PT LONG TERM GOAL #2   Title Patient to improve L shoulder AROM to [facility] without pain provocation    Status On-going      PT LONG TERM GOAL #3   Title Patient will demonstrate improved L shoulder strength to >/= 4/5 for functional UE use    Status On-going      PT LONG TERM GOAL #4   Title Patient to demonstrate appropriate posture and body mechanics needed for daily activities    Status On-going      PT LONG TERM GOAL #5   Title Patient to report ability to perform ADLs, household and work-related tasks without increased L shoulder pain    Status On-going                 Plan - 10/14/20 0942    Clinical Impression Statement [name] reports minimal to no pain at rest but up to 8/10 with movement, especially lowering arm from upper ROM flexion or scaption due to catching or locking sensation of shoulder - unable to find comfortable AAROM pattern or complete PROM w/o triggering catch. Significant increased muscle tension/tightness present t/o L shoulder complete, particularly in UT and periscapular muscles, which limits scapular motion and likely contributing to impingement with overhead motions.    Rehab Potential Good    PT Frequency 2x / week    PT Duration 8 weeks    PT  Treatment/Interventions ADLs/Self Care Home Management;Cryotherapy;Electrical Stimulation;Iontophoresis 4mg /ml Dexamethasone;Moist Heat;Ultrasound;Therapeutic activities;Therapeutic exercise;Neuromuscular re-education;Patient/family education;Manual techniques;Passive range of motion;Dry needling;Taping;Vasopneumatic Device;Joint Manipulations    PT Next Visit Plan L shoulder P/AAROM to tolerance; postural stabilization/strengthening; manual therapy to address  pain and increased muscle tension; modalities PRN for pain    PT Home Exercise Plan 10/5 - pendulums, flexion/scaption P/AAROM table slides; 10/11 - tennis ball massage to infraspinatus; 10/14 - yellow TB row    Consulted and Agree with Plan of Care Patient           Patient will benefit from skilled therapeutic intervention in order to improve the following deficits and impairments:  Decreased activity tolerance, Decreased range of motion, Decreased strength, Increased muscle spasms, Impaired perceived functional ability, Impaired flexibility, Impaired UE functional use, Improper body mechanics, Postural dysfunction, Pain  Visit Diagnosis: Chronic left shoulder pain  Stiffness of left shoulder, not elsewhere classified  Muscle weakness (generalized)  Abnormal posture  Other symptoms and signs involving the musculoskeletal system     Problem List Patient Active Problem List   Diagnosis Date Noted   Status post repeat low transverse cesarean section 08/12/2017   Late prenatal care in third trimester 06/23/2017   Supervision of high risk pregnancy, antepartum 02/15/2017   Advanced maternal age in multigravida, unspecified trimester 02/15/2017   Non-English speaking patient 02/15/2017   Previous cesarean delivery, antepartum condition or complication 10/06/2014   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  [address] [place], [place], [address] Phone: [phone]   Fax:  [phone]  Name: Gabriela Le MRN: [id number] Date of Birth: [birth year]

## 2020-10-17 ENCOUNTER — Ambulatory Visit: Payer: 59

## 2020-10-17 ENCOUNTER — Other Ambulatory Visit: Payer: Self-pay

## 2020-10-17 DIAGNOSIS — M6281 Muscle weakness (generalized): Secondary | ICD-10-CM

## 2020-10-17 DIAGNOSIS — G8929 Other chronic pain: Secondary | ICD-10-CM

## 2020-10-17 DIAGNOSIS — M25612 Stiffness of left shoulder, not elsewhere classified: Secondary | ICD-10-CM

## 2020-10-17 DIAGNOSIS — M25512 Pain in left shoulder: Secondary | ICD-10-CM | POA: Diagnosis not present

## 2020-10-17 DIAGNOSIS — R29898 Other symptoms and signs involving the musculoskeletal system: Secondary | ICD-10-CM

## 2020-10-17 DIAGNOSIS — R293 Abnormal posture: Secondary | ICD-10-CM

## 2020-10-17 NOTE — Therapy (Signed)
[facility] [address]  [address] [place], [place], [address] Phone: [phone]   Fax:  [phone]  Physical Therapy Treatment  Patient Details  Name: Gabriela Le MRN: [id number] Date of Birth: January 06, 1976 Referring Provider (PT): Richardean Canal, PA-C   Encounter Date: 10/17/2020   PT End of Session - 10/17/20 1005    Visit Number 5    Number of Visits 16    Date for PT Re-Evaluation 11/26/20    Authorization Type [employer] (VL = 30) & Medicaid UHC    PT Start Time 0930    PT Stop Time 1025   Ended visit with 10 min miost heat   PT Time Calculation (min) 55 min    Activity Tolerance Patient tolerated treatment well    Behavior During Therapy [facility] for tasks assessed/performed           Past Medical History:  Diagnosis Date  . GERD (gastroesophageal reflux disease)   . H/O cold sores    ACYCLOVIR OINTMENT PRN  . Infertility associated with anovulation    HAS TAKEN CLOMID IN THE PAST  . Irregular menses   . Missed ab 12/29/2010   no surgery required  . 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 APPENDECTOMY     6 YOA  . CESAREAN SECTION  01/06/2013   Procedure: CESAREAN SECTION;  Surgeon: Hal Morales, MD;  Location: [facility];  Service: Obstetrics;  Laterality: N/A;  Primary  . CESAREAN SECTION N/A 10/09/2014   Procedure: CESAREAN SECTION;  Surgeon: Konrad Felix, MD;  Location: [facility];  Service: Obstetrics;  Laterality: N/A;  . CESAREAN SECTION N/A 08/09/2017   Procedure: REPEAT CESAREAN SECTION;  Surgeon: Willodean Rosenthal, MD;  Location: [facility];  Service: Obstetrics;  Laterality: N/A;  . CHOLECYSTECTOMY N/A 11/27/2014   Procedure: LAPAROSCOPIC CHOLECYSTECTOMY WITH INTRAOPERATIVE CHOLANGIOGRAM;  Surgeon: Chevis Pretty III, MD;  Location: [facility];  Service: General;  Laterality: N/A;  . CYSTECTOMY      There were no vitals filed for this visit.    Subjective Assessment - 10/17/20 0939    Subjective Pt. noting 10/10 "catching" pain lowering arm from overhead reaching.  Continues to deny pain at rest.    Diagnostic tests 09/12/20 - L shoulder MRI: 1. Mild tendinosis of the supraspinatus tendon with a small partial-thickness bursal surface tear anteriorly. 2. Mild tendinosis of the infraspinatus tendon. 3. Moderate subacromial/subdeltoid bursitis.    Patient Stated Goals "to make my shoulder get back to normal"    Currently in Pain? No/denies    Pain Score 0-No pain   pain up to 10/10 "catching" pain lowering arm from overhead reaching   Pain Location Shoulder    Pain Orientation Left                             [facility] Adult PT Treatment/Exercise - 10/17/20 0001      Self-Care   Self-Care Other Self-Care Comments    Other Self-Care Comments  ball release to L rhomboids laying on ball on mat table       Shoulder Exercises: Supine   Protraction Both;20 reps;AROM    Protraction Limitations heavy TC with improving scapular protraction ROM with guidance       Shoulder Exercises: ROM/Strengthening   Cybex Row 20 reps   Improved scapular mobility noted as set progressed  Cybex Row Limitations 5# - straight arm row - retraction pull       Shoulder Exercises: Lawyer 2 reps;30 seconds    Corner Stretch Limitations B single arm low in doorway     Other Shoulder Stretches L UT & LS stretch 2 x 30 sec       Modalities   Modalities Moist Heat      Moist Heat Therapy   Number Minutes Moist Heat 10 Minutes    Moist Heat Location Shoulder   B scapula; supine      Manual Therapy   Manual Therapy Passive ROM;Soft tissue mobilization;Myofascial release;Scapular mobilization    Myofascial Release Manual TPR to L UT, rhomboids - pt. noting in rhomboids and under shoulder - "feels like a ball"    Scapular Mobilization L scapular mobs - all directions - signficant restriction   mild improvement following supine  protractions                    PT Short Term Goals - 10/07/20 0945      PT SHORT TERM GOAL #1   Title Patient will be independent with initial HEP    Status On-going    Target Date 10/22/20      PT SHORT TERM GOAL #2   Title Patient to improve L shoulder flexion & abduction P/AAROM to >/= 120 dg without pain provocation    Status On-going    Target Date 10/29/20             PT Long Term Goals - 10/07/20 0945      PT LONG TERM GOAL #1   Title Patient will be independent with ongoing/advanced HEP    Status On-going      PT LONG TERM GOAL #2   Title Patient to improve L shoulder AROM to [facility] without pain provocation    Status On-going      PT LONG TERM GOAL #3   Title Patient will demonstrate improved L shoulder strength to >/= 4/5 for functional UE use    Status On-going      PT LONG TERM GOAL #4   Title Patient to demonstrate appropriate posture and body mechanics needed for daily activities    Status On-going      PT LONG TERM GOAL #5   Title Patient to report ability to perform ADLs, household and work-related tasks without increased L shoulder pain    Status On-going                 Plan - 10/17/20 1254    Clinical Impression Statement Haileigh continues to note high subjective pain rating with reaching activities most lowering arm back down with "catching" sensation.  Continues to demo poor scapular mobility with all reaching/lifting motions in session which was improved somewhat after supine protraction/machine retraction with tactile therapist guidance and MT.  Pt. will likely need continue MT and tactile feedback with therex for improved scapulohumeral rhythm for reduction in L shoulder impingement with functional activities.    Rehab Potential Good    PT Frequency 2x / week    PT Duration 8 weeks    PT Treatment/Interventions ADLs/Self Care Home Management;Cryotherapy;Electrical Stimulation;Iontophoresis 4mg /ml Dexamethasone;Moist  Heat;Ultrasound;Therapeutic activities;Therapeutic exercise;Neuromuscular re-education;Patient/family education;Manual techniques;Passive range of motion;Dry needling;Taping;Vasopneumatic Device;Joint Manipulations    PT Next Visit Plan L shoulder P/AAROM to tolerance; postural stabilization/strengthening; manual therapy to address pain and increased muscle tension; modalities PRN for pain    PT Home Exercise  Plan 10/5 - pendulums, flexion/scaption P/AAROM table slides; 10/11 - tennis ball massage to infraspinatus; 10/14 - yellow TB row    Consulted and Agree with Plan of Care Patient           Patient will benefit from skilled therapeutic intervention in order to improve the following deficits and impairments:  Decreased activity tolerance, Decreased range of motion, Decreased strength, Increased muscle spasms, Impaired perceived functional ability, Impaired flexibility, Impaired UE functional use, Improper body mechanics, Postural dysfunction, Pain  Visit Diagnosis: Chronic left shoulder pain  Stiffness of left shoulder, not elsewhere classified  Muscle weakness (generalized)  Abnormal posture  Other symptoms and signs involving the musculoskeletal system     Problem List Patient Active Problem List   Diagnosis Date Noted  . Status post repeat low transverse cesarean section 08/12/2017  . Late prenatal care in third trimester 06/23/2017  . Supervision of high risk pregnancy, antepartum 02/15/2017  . Advanced maternal age in multigravida, unspecified trimester 02/15/2017  . Non-English speaking patient 02/15/2017  . Previous cesarean delivery, antepartum condition or complication 10/06/2014  . 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

## 2020-10-21 ENCOUNTER — Ambulatory Visit: Payer: 59

## 2020-10-21 ENCOUNTER — Other Ambulatory Visit: Payer: Self-pay

## 2020-10-21 DIAGNOSIS — G8929 Other chronic pain: Secondary | ICD-10-CM

## 2020-10-21 DIAGNOSIS — M25512 Pain in left shoulder: Secondary | ICD-10-CM

## 2020-10-21 DIAGNOSIS — M6281 Muscle weakness (generalized): Secondary | ICD-10-CM

## 2020-10-21 DIAGNOSIS — R293 Abnormal posture: Secondary | ICD-10-CM

## 2020-10-21 DIAGNOSIS — M25612 Stiffness of left shoulder, not elsewhere classified: Secondary | ICD-10-CM

## 2020-10-21 DIAGNOSIS — R29898 Other symptoms and signs involving the musculoskeletal system: Secondary | ICD-10-CM

## 2020-10-21 NOTE — Therapy (Signed)
[facility] [address]  [place], [place], [address] Phone: [phone]   Fax:  [phone]  Physical Therapy Treatment  Patient Details  Name: Gabriela Le MRN: [id number] Date of Birth: March 08, 1976 Referring Provider (PT): Erskine Emery, PA-C   Encounter Date: 10/21/2020   PT End of Session - 10/21/20 4982    Visit Number 6    Number of Visits 16    Date for PT Re-Evaluation 11/26/20    Authorization Type [employer] (VL = 30) & Medicaid [employer]    PT Start Time 0930    PT Stop Time 1010    PT Time Calculation (min) 40 min    Activity Tolerance Patient tolerated treatment well    Behavior During Therapy [facility] for tasks assessed/performed           Past Medical History:  Diagnosis Date  . GERD (gastroesophageal reflux disease)   . H/O cold sores    ACYCLOVIR OINTMENT PRN  . Infertility associated with anovulation    HAS TAKEN CLOMID IN THE PAST  . Irregular menses   . Missed ab 12/29/2010   no surgery required  . 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 APPENDECTOMY     6 YOA  . CESAREAN SECTION  01/06/2013   Procedure: CESAREAN SECTION;  Surgeon: Eldred Manges, MD;  Location: [facility] [place] [facility];  Service: Obstetrics;  Laterality: N/A;  Primary  . CESAREAN SECTION N/A 10/09/2014   Procedure: CESAREAN SECTION;  Surgeon: Alinda Dooms, MD;  Location: [facility];  Service: Obstetrics;  Laterality: N/A;  . CESAREAN SECTION N/A 08/09/2017   Procedure: REPEAT CESAREAN SECTION;  Surgeon: Lavonia Drafts, MD;  Location: [place];  Service: Obstetrics;  Laterality: N/A;  . CHOLECYSTECTOMY N/A 11/27/2014   Procedure: LAPAROSCOPIC CHOLECYSTECTOMY WITH INTRAOPERATIVE CHOLANGIOGRAM;  Surgeon: Autumn Messing III, MD;  Location: [facility];  Service: General;  Laterality: N/A;  . CYSTECTOMY      There were no vitals filed for this visit.   Subjective Assessment - 10/21/20 0945     Subjective Had increased pain at home after weekend at work.    Diagnostic tests 09/12/20 - L shoulder MRI: 1. Mild tendinosis of the supraspinatus tendon with a small partial-thickness bursal surface tear anteriorly. 2. Mild tendinosis of the infraspinatus tendon. 3. Moderate subacromial/subdeltoid bursitis.    Patient Stated Goals "to make my shoulder get back to normal"    Currently in Pain? No/denies    Pain Score 0-No pain   pain up 8/10 "catching" "heavy" pain   Pain Location Shoulder    Pain Orientation Left              [facility] PT Assessment - 10/21/20 0001      AROM   Left Shoulder Flexion 80 Degrees    Left Shoulder ABduction 45 Degrees      PROM   PROM Assessment Site Shoulder    Right/Left Shoulder Left    Left Shoulder Flexion 153 Degrees    Left Shoulder ABduction 140 Degrees                         [facility] Adult PT Treatment/Exercise - 10/21/20 0001      Shoulder Exercises: Supine   Flexion Left;AAROM;15 reps    Flexion Limitations wand     ABduction Left;AAROM;10 reps    ABduction Limitations wand  Shoulder Exercises: Seated   Flexion Left;10 reps;AAROM    Flexion Limitations green p-ball rollouts     Other Seated Exercises Seated wand AAROM flexion attempted however terminated due to poor tolerance/techinque     Other Seated Exercises seated shoulder circles x 10      Shoulder Exercises: Pulleys   Flexion 3 minutes    Flexion Limitations Improved ROM noted     Scaption 3 minutes    Scaption Limitations Improved ROM noted       Shoulder Exercises: ROM/Strengthening   Pendulum L shoulder fwd/back, side/side, CW/CW x 30 sec each      Manual Therapy   Passive ROM L shoulder PROM all directions to tolerance - improved flexion, ER, abduction ROM noted however still with intermittent "stuck" sensation reported with L shoulder motion and heavy muscular guarding requiring increased time                   PT Education - 10/21/20  1231    Education Details HEP update; supine want AAROM abduction, shoulder circles    Person(s) Educated Patient    Methods Explanation;Demonstration;Verbal cues;Handout    Comprehension Verbalized understanding;Returned demonstration;Verbal cues required            PT Short Term Goals - 10/21/20 0940      PT SHORT TERM GOAL #1   Title Patient will be independent with initial HEP    Status Achieved    Target Date 10/22/20      PT SHORT TERM GOAL #2   Title Patient to improve L shoulder flexion & abduction P/AAROM to >/= 120 dg without pain provocation    Status Partially Met   10/21/20: met for PROM   Target Date 10/29/20             PT Long Term Goals - 10/07/20 0945      PT LONG TERM GOAL #1   Title Patient will be independent with ongoing/advanced HEP    Status On-going      PT LONG TERM GOAL #2   Title Patient to improve L shoulder AROM to [facility] without pain provocation    Status On-going      PT LONG TERM GOAL #3   Title Patient will demonstrate improved L shoulder strength to >/= 4/5 for functional UE use    Status On-going      PT LONG TERM GOAL #4   Title Patient to demonstrate appropriate posture and body mechanics needed for daily activities    Status On-going      PT LONG TERM GOAL #5   Title Patient to report ability to perform ADLs, household and work-related tasks without increased L shoulder pain    Status On-going                 Plan - 10/21/20 0940    Clinical Impression Statement Pt. denies questions regarding initial HEP.  STG #1 met.  Notes L shoulder pain did not bother her as much at work over the weekend however bothered (up to a 8/10 "catching" pain) at home with lowering arm after elevation.  Pt. continues with report of "stuck" sensation while trying to perform seated flexion, abduction AROM unable to lift to 90 dg (shoulder height) without assistance with heavy L UT muscular guarding which limits scapulohumeral rhythm.  Was able to  demonstrate significant progress with shoulder flexion, abduction PROM thus STG #2 partially achieved.  Progressed AAROM activities today with HEP updated.  Deferred modalities  to end session as pt. pain free.    Rehab Potential Good    PT Frequency 2x / week    PT Duration 8 weeks    PT Treatment/Interventions ADLs/Self Care Home Management;Cryotherapy;Electrical Stimulation;Iontophoresis 21m/ml Dexamethasone;Moist Heat;Ultrasound;Therapeutic activities;Therapeutic exercise;Neuromuscular re-education;Patient/family education;Manual techniques;Passive range of motion;Dry needling;Taping;Vasopneumatic Device;Joint Manipulations    PT Next Visit Plan L shoulder P/AAROM to tolerance; postural stabilization/strengthening; manual therapy to address pain and increased muscle tension; modalities PRN for pain    PT Home Exercise Plan 10/5 - pendulums, flexion/scaption P/AAROM table slides; 10/11 - tennis ball massage to infraspinatus; 10/14 - yellow TB row; 10/25 - supine AAROM flexion wand, shoulder circles    Consulted and Agree with Plan of Care Patient           Patient will benefit from skilled therapeutic intervention in order to improve the following deficits and impairments:  Decreased activity tolerance, Decreased range of motion, Decreased strength, Increased muscle spasms, Impaired perceived functional ability, Impaired flexibility, Impaired UE functional use, Improper body mechanics, Postural dysfunction, Pain  Visit Diagnosis: Chronic left shoulder pain  Stiffness of left shoulder, not elsewhere classified  Muscle weakness (generalized)  Abnormal posture  Other symptoms and signs involving the musculoskeletal system     Problem List Patient Active Problem List   Diagnosis Date Noted  . Status post repeat low transverse cesarean section 08/12/2017  . Late prenatal care in third trimester 06/23/2017  . Supervision of high risk pregnancy, antepartum 02/15/2017  . Advanced  maternal age in multigravida, unspecified trimester 02/15/2017  . Non-English speaking patient 02/15/2017  . Previous cesarean delivery, antepartum condition or complication 192/92/4462 . 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 [place] [address]: [phone]  Fax:  [phone] Name: Gabriela ELGERSMAMRN: [id number] of Birth: [birth year]

## 2020-10-24 ENCOUNTER — Ambulatory Visit: Payer: 59 | Admitting: Physical Therapy

## 2020-10-24 ENCOUNTER — Other Ambulatory Visit: Payer: Self-pay

## 2020-10-24 ENCOUNTER — Encounter: Payer: Self-pay | Admitting: Physical Therapy

## 2020-10-24 DIAGNOSIS — M25612 Stiffness of left shoulder, not elsewhere classified: Secondary | ICD-10-CM

## 2020-10-24 DIAGNOSIS — G8929 Other chronic pain: Secondary | ICD-10-CM

## 2020-10-24 DIAGNOSIS — R293 Abnormal posture: Secondary | ICD-10-CM

## 2020-10-24 DIAGNOSIS — M25512 Pain in left shoulder: Secondary | ICD-10-CM | POA: Diagnosis not present

## 2020-10-24 DIAGNOSIS — R29898 Other symptoms and signs involving the musculoskeletal system: Secondary | ICD-10-CM

## 2020-10-24 DIAGNOSIS — M6281 Muscle weakness (generalized): Secondary | ICD-10-CM

## 2020-10-24 NOTE — Therapy (Signed)
[facility] [address]  [place], [place], [address] Phone: [phone]   Fax:  [phone]  Physical Therapy Treatment  Patient Details  Name: Gabriela Le MRN: [id number] Date of Birth: [birth year] Referring Provider (PT): Erskine Emery, PA-C   Encounter Date: 10/24/2020   PT End of Session - 10/24/20 0940    Visit Number 7    Number of Visits 16    Date for PT Re-Evaluation 11/26/20    Authorization Type [employer] (VL = 30) & Medicaid [employer]    PT Start Time 0940    PT Stop Time 1020    PT Time Calculation (min) 40 min    Activity Tolerance Patient tolerated treatment well    Behavior During Therapy [facility] for tasks assessed/performed           Past Medical History:  Diagnosis Date  . GERD (gastroesophageal reflux disease)   . H/O cold sores    ACYCLOVIR OINTMENT PRN  . Infertility associated with anovulation    HAS TAKEN CLOMID IN THE PAST  . Irregular menses   . Missed ab 12/29/2010   no surgery required  . 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 APPENDECTOMY     6 YOA  . CESAREAN SECTION  01/06/2013   Procedure: CESAREAN SECTION;  Surgeon: Eldred Manges, MD;  Location: [facility];  Service: Obstetrics;  Laterality: N/A;  Primary  . CESAREAN SECTION N/A 10/09/2014   Procedure: CESAREAN SECTION;  Surgeon: Alinda Dooms, MD;  Location: [place] [facility];  Service: Obstetrics;  Laterality: N/A;  . CESAREAN SECTION N/A 08/09/2017   Procedure: REPEAT CESAREAN SECTION;  Surgeon: Lavonia Drafts, MD;  Location: [place];  Service: Obstetrics;  Laterality: N/A;  . CHOLECYSTECTOMY N/A 11/27/2014   Procedure: LAPAROSCOPIC CHOLECYSTECTOMY WITH INTRAOPERATIVE CHOLANGIOGRAM;  Surgeon: Autumn Messing III, MD;  Location: [facility];  Service: General;  Laterality: N/A;  . CYSTECTOMY      There were no vitals filed for this visit.   Subjective Assessment - 10/24/20 0943     Subjective Pt reports she feels that it is easier to raise her arm up but feels like it gets "caught" on the way back down. Denies pain but notes some numbness along upper shoulder at night.    Diagnostic tests 09/12/20 - L shoulder MRI: 1. Mild tendinosis of the supraspinatus tendon with a small partial-thickness bursal surface tear anteriorly. 2. Mild tendinosis of the infraspinatus tendon. 3. Moderate subacromial/subdeltoid bursitis.    Patient Stated Goals "to make my shoulder get back to normal"    Currently in Pain? No/denies                             [facility] Adult PT Treatment/Exercise - 10/24/20 0940      Exercises   Exercises Shoulder      Shoulder Exercises: Pulleys   Flexion 3 minutes    Flexion Limitations Improved ROM noted     Scaption 3 minutes    Scaption Limitations Improved ROM noted       Shoulder Exercises: ROM/Strengthening   Wall Wash L shoulder flexion AAROM slide up wall with washcloth + R hand assisting at elbow x 10      Shoulder Exercises: Isometric Strengthening   Flexion Limitations 10 x 5"    Extension Limitations 10 x 5"    External Rotation  Limitations 10 x 5"    Internal Rotation Limitations 10 x 5"    ABduction Limitations 10 x 5"      Manual Therapy   Manual Therapy Soft tissue mobilization;Myofascial release    Soft tissue mobilization STM/DTM to L UT, LS and deltoids - ttp throughout and increased tension especially in L UT with taut bands identified in anterolateral deltoid                  PT Education - 10/24/20 1019    Education Details Information on DN for potential incorporation into future MT    Person(s) Educated Patient    Methods Explanation;Handout    Comprehension Verbalized understanding            PT Short Term Goals - 10/21/20 0940      PT SHORT TERM GOAL #1   Title Patient will be independent with initial HEP    Status Achieved    Target Date 10/22/20      PT SHORT TERM GOAL #2    Title Patient to improve L shoulder flexion & abduction P/AAROM to >/= 120 dg without pain provocation    Status Partially Met   10/21/20: met for PROM   Target Date 10/29/20             PT Long Term Goals - 10/07/20 0945      PT LONG TERM GOAL #1   Title Patient will be independent with ongoing/advanced HEP    Status On-going      PT LONG TERM GOAL #2   Title Patient to improve L shoulder AROM to [facility] without pain provocation    Status On-going      PT LONG TERM GOAL #3   Title Patient will demonstrate improved L shoulder strength to >/= 4/5 for functional UE use    Status On-going      PT LONG TERM GOAL #4   Title Patient to demonstrate appropriate posture and body mechanics needed for daily activities    Status On-going      PT LONG TERM GOAL #5   Title Patient to report ability to perform ADLs, household and work-related tasks without increased L shoulder pain    Status On-going                 Plan - 10/24/20 [place] reports better motion and less pain in L shoulder but still notes "catching" sensation when lowering her arm with feeling of "weakness" or "heaviness". Introduced L shoulder flexion wall slides btu pt requiring AAROM with R hand assisting at elbow - pt noting better control of motion with this than with wand AAROM. Initiated isometric strengthening with no pain but pt again noting "heaviness" in shoulder. Pt notes feeling of taut bands in muscle restricting motion with increased muscle tension and banding noted in L UT and deltoids - she may benefit from trial of DN in future visits if tightness/tension persists.    Rehab Potential Good    PT Frequency 2x / week    PT Duration 8 weeks    PT Treatment/Interventions ADLs/Self Care Home Management;Cryotherapy;Electrical Stimulation;Iontophoresis 99m/ml Dexamethasone;Moist Heat;Ultrasound;Therapeutic activities;Therapeutic exercise;Neuromuscular re-education;Patient/family  education;Manual techniques;Passive range of motion;Dry needling;Taping;Vasopneumatic Device;Joint Manipulations    PT Next Visit Plan L shoulder P/AAROM to tolerance; postural stabilization/strengthening; manual therapy to address pain and increased muscle tension; modalities PRN for pain    PT Home Exercise Plan 10/5 - pendulums, flexion/scaption P/AAROM table slides;  10/11 - tennis ball massage to infraspinatus; 10/14 - yellow TB row; 10/25 - supine AAROM flexion wand, shoulder circles    Consulted and Agree with Plan of Care Patient           Patient will benefit from skilled therapeutic intervention in order to improve the following deficits and impairments:  Decreased activity tolerance, Decreased range of motion, Decreased strength, Increased muscle spasms, Impaired perceived functional ability, Impaired flexibility, Impaired UE functional use, Improper body mechanics, Postural dysfunction, Pain  Visit Diagnosis: Chronic left shoulder pain  Stiffness of left shoulder, not elsewhere classified  Muscle weakness (generalized)  Abnormal posture  Other symptoms and signs involving the musculoskeletal system     Problem List Patient Active Problem List   Diagnosis Date Noted  . Status post repeat low transverse cesarean section 08/12/2017  . Late prenatal care in third trimester 06/23/2017  . Supervision of high risk pregnancy, antepartum 02/15/2017  . Advanced maternal age in multigravida, unspecified trimester 02/15/2017  . Non-English speaking patient 02/15/2017  . Previous cesarean delivery, antepartum condition or complication 88/89/1694  . 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

## 2020-10-24 NOTE — Patient Instructions (Signed)

## 2020-10-28 ENCOUNTER — Other Ambulatory Visit: Payer: Self-pay

## 2020-10-28 ENCOUNTER — Ambulatory Visit: Payer: 59 | Attending: Physician Assistant

## 2020-10-28 DIAGNOSIS — M25512 Pain in left shoulder: Secondary | ICD-10-CM | POA: Insufficient documentation

## 2020-10-28 DIAGNOSIS — R29898 Other symptoms and signs involving the musculoskeletal system: Secondary | ICD-10-CM | POA: Diagnosis present

## 2020-10-28 DIAGNOSIS — R293 Abnormal posture: Secondary | ICD-10-CM | POA: Diagnosis present

## 2020-10-28 DIAGNOSIS — G8929 Other chronic pain: Secondary | ICD-10-CM | POA: Insufficient documentation

## 2020-10-28 DIAGNOSIS — M25612 Stiffness of left shoulder, not elsewhere classified: Secondary | ICD-10-CM | POA: Diagnosis present

## 2020-10-28 DIAGNOSIS — M6281 Muscle weakness (generalized): Secondary | ICD-10-CM | POA: Diagnosis present

## 2020-10-28 NOTE — Therapy (Signed)
[facility] [address]  [place], [place], [address] Phone: [phone]   Fax:  [phone]  Physical Therapy Treatment  Patient Details  Name: Gabriela Le MRN: [id number] Date of Birth: 10/02/1976 Referring Provider (PT): Erskine Emery, PA-C   Encounter Date: 10/28/2020   PT End of Session - 10/28/20 0949    Visit Number 8    Number of Visits 16    Date for PT Re-Evaluation 11/26/20    Authorization Type [employer] (VL = 30) & Medicaid U[employer]    PT Start Time [phone]    PT Stop Time 1009    PT Time Calculation (min) 38 min    Activity Tolerance Patient tolerated treatment well    Behavior During Therapy [facility] for tasks assessed/performed           Past Medical History:  Diagnosis Date  . GERD (gastroesophageal reflux disease)   . H/O cold sores    ACYCLOVIR OINTMENT PRN  . Infertility associated with anovulation    HAS TAKEN CLOMID IN THE PAST  . Irregular menses   . Missed ab 12/29/2010   no surgery required  . 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 APPENDECTOMY     6 YOA  . CESAREAN SECTION  01/06/2013   Procedure: CESAREAN SECTION;  Surgeon: Eldred Manges, MD;  Location: [facility];  Service: Obstetrics;  Laterality: N/A;  Primary  . CESAREAN SECTION N/A 10/09/2014   Procedure: CESAREAN SECTION;  Surgeon: Alinda Dooms, MD;  Location: [facility];  Service: Obstetrics;  Laterality: N/A;  . CESAREAN SECTION N/A 08/09/2017   Procedure: REPEAT CESAREAN SECTION;  Surgeon: Lavonia Drafts, MD;  Location: [place];  Service: Obstetrics;  Laterality: N/A;  . CHOLECYSTECTOMY N/A 11/27/2014   Procedure: LAPAROSCOPIC CHOLECYSTECTOMY WITH INTRAOPERATIVE CHOLANGIOGRAM;  Surgeon: Autumn Messing III, MD;  Location: [facility];  Service: General;  Laterality: N/A;  . CYSTECTOMY      There were no vitals filed for this visit.   Subjective Assessment - 10/28/20 0944      Subjective Pt. noting some increased L shoulder soreness after performing "wall wash" exercise last session.    Diagnostic tests 09/12/20 - L shoulder MRI: 1. Mild tendinosis of the supraspinatus tendon with a small partial-thickness bursal surface tear anteriorly. 2. Mild tendinosis of the infraspinatus tendon. 3. Moderate subacromial/subdeltoid bursitis.    Patient Stated Goals "to make my shoulder get back to normal"    Currently in Pain? Yes    Pain Score 2     Pain Location Shoulder    Pain Orientation Left    Pain Descriptors / Indicators Burning    Pain Type Chronic pain    Pain Radiating Towards still with occasional cold sensation down L arm to fingers when she moves quickly    Pain Onset More than a month ago    Pain Frequency Intermittent    Multiple Pain Sites No                             [facility] Adult PT Treatment/Exercise - 10/28/20 0001      Shoulder Exercises: Prone   Retraction Left;15 reps;Strengthening    Retraction Limitations much improved scap. retraction with cueing     Extension Left;15 reps;Strengthening    Extension Limitations much improved scap. retraction with tc      Shoulder  Exercises: Standing   Retraction 15 reps;Both    Retraction Limitations B scapular retraction leaning on doorseal    heavy cueing to avoid scap. elevation      Shoulder Exercises: Pulleys   Flexion 3 minutes    Scaption 3 minutes      Shoulder Exercises: Isometric Strengthening   Flexion Limitations 10 x 5"    Extension Limitations 10 x 5"    External Rotation --   some cueing required for proper positioning    External Rotation Limitations 10 x 5"    Internal Rotation Limitations 10 x 5"    ABduction --   some cueing required for proper positioning    ABduction Limitations 10 x 5"                    PT Short Term Goals - 10/21/20 0940      PT SHORT TERM GOAL #1   Title Patient will be independent with initial HEP    Status Achieved     Target Date 10/22/20      PT SHORT TERM GOAL #2   Title Patient to improve L shoulder flexion & abduction P/AAROM to >/= 120 dg without pain provocation    Status Partially Met   10/21/20: met for PROM   Target Date 10/29/20             PT Long Term Goals - 10/07/20 0945      PT LONG TERM GOAL #1   Title Patient will be independent with ongoing/advanced HEP    Status On-going      PT LONG TERM GOAL #2   Title Patient to improve L shoulder AROM to [facility] without pain provocation    Status On-going      PT LONG TERM GOAL #3   Title Patient will demonstrate improved L shoulder strength to >/= 4/5 for functional UE use    Status On-going      PT LONG TERM GOAL #4   Title Patient to demonstrate appropriate posture and body mechanics needed for daily activities    Status On-going      PT LONG TERM GOAL #5   Title Patient to report ability to perform ADLs, household and work-related tasks without increased L shoulder pain    Status On-going                 Plan - 10/28/20 [place] reporting some increased shoulder pain after performing wall wash ROM exercise last session requesting to avoid this exercise today.  Notes a sense of a "ball" at L scapula area in muscle and reports she may be ready to try DN at upcoming visit.  Today's session focused on continued L shoulder isometric strengthening along with periscapular strengthening with tactile cueing required for proper scapular mechanics with reduced scapular mobility noted likely attributes to ongoing tension in periscapular musculature.  Ended visit with pt. reporting she was pain free.    Rehab Potential Good    PT Frequency 2x / week    PT Treatment/Interventions ADLs/Self Care Home Management;Cryotherapy;Electrical Stimulation;Iontophoresis 71m/ml Dexamethasone;Moist Heat;Ultrasound;Therapeutic activities;Therapeutic exercise;Neuromuscular re-education;Patient/family education;Manual  techniques;Passive range of motion;Dry needling;Taping;Vasopneumatic Device;Joint Manipulations    PT Next Visit Plan L shoulder P/AAROM to tolerance; postural stabilization/strengthening; manual therapy to address pain and increased muscle tension; modalities PRN for pain    PT Home Exercise Plan 10/5 - pendulums, flexion/scaption P/AAROM table slides; 10/11 - tennis ball massage to infraspinatus; 10/14 -  yellow TB row; 10/25 - supine AAROM flexion wand, shoulder circles    Consulted and Agree with Plan of Care Patient    Family Member Consulted --           Patient will benefit from skilled therapeutic intervention in order to improve the following deficits and impairments:  Decreased activity tolerance, Decreased range of motion, Decreased strength, Increased muscle spasms, Impaired perceived functional ability, Impaired flexibility, Impaired UE functional use, Improper body mechanics, Postural dysfunction, Pain  Visit Diagnosis: Chronic left shoulder pain  Stiffness of left shoulder, not elsewhere classified  Muscle weakness (generalized)     Problem List Patient Active Problem List   Diagnosis Date Noted  . Status post repeat low transverse cesarean section 08/12/2017  . Late prenatal care in third trimester 06/23/2017  . Supervision of high risk pregnancy, antepartum 02/15/2017  . Advanced maternal age in multigravida, unspecified trimester 02/15/2017  . Non-English speaking patient 02/15/2017  . Previous cesarean delivery, antepartum condition or complication 32/20/2542  . 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, [place], [address] Phone: [phone]   Fax:  [phone]  Name: Gabriela Le MRN: [id number] Date of Birth: January 11, 1976

## 2020-10-31 ENCOUNTER — Other Ambulatory Visit: Payer: Self-pay

## 2020-10-31 ENCOUNTER — Ambulatory Visit: Payer: 59 | Admitting: Physical Therapy

## 2020-10-31 DIAGNOSIS — M6281 Muscle weakness (generalized): Secondary | ICD-10-CM

## 2020-10-31 DIAGNOSIS — M25512 Pain in left shoulder: Secondary | ICD-10-CM | POA: Diagnosis not present

## 2020-10-31 DIAGNOSIS — R29898 Other symptoms and signs involving the musculoskeletal system: Secondary | ICD-10-CM

## 2020-10-31 DIAGNOSIS — G8929 Other chronic pain: Secondary | ICD-10-CM

## 2020-10-31 DIAGNOSIS — M25612 Stiffness of left shoulder, not elsewhere classified: Secondary | ICD-10-CM

## 2020-10-31 DIAGNOSIS — R293 Abnormal posture: Secondary | ICD-10-CM

## 2020-10-31 NOTE — Therapy (Signed)
[facility] [address]  [place], [place], [address] Phone: [phone]   Fax:  [phone]  Physical Therapy Treatment / Progress Note  Patient Details  Name: Gabriela Le MRN: [id number] Date of Birth: [birth year] Referring Provider (PT): Erskine Emery, PA-C   Encounter Date: 10/31/2020   PT End of Session - 10/31/20 0932    Visit Number 9    Number of Visits 16    Date for PT Re-Evaluation 11/26/20    Authorization Type Medicaid UHC    PT Start Time 0932    PT Stop Time 1040    PT Time Calculation (min) 68 min    Activity Tolerance Patient tolerated treatment well    Behavior During Therapy [facility] for tasks assessed/performed           Past Medical History:  Diagnosis Date  . GERD (gastroesophageal reflux disease)   . H/O cold sores    ACYCLOVIR OINTMENT PRN  . Infertility associated with anovulation    HAS TAKEN CLOMID IN THE PAST  . Irregular menses   . Missed ab 12/29/2010   no surgery required  . 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 APPENDECTOMY     6 YOA  . CESAREAN SECTION  01/06/2013   Procedure: CESAREAN SECTION;  Surgeon: Eldred Manges, MD;  Location: [facility];  Service: Obstetrics;  Laterality: N/A;  Primary  . CESAREAN SECTION N/A 10/09/2014   Procedure: CESAREAN SECTION;  Surgeon: Alinda Dooms, MD;  Location: [facility];  Service: Obstetrics;  Laterality: N/A;  . CESAREAN SECTION N/A 08/09/2017   Procedure: REPEAT CESAREAN SECTION;  Surgeon: Lavonia Drafts, MD;  Location: [place];  Service: Obstetrics;  Laterality: N/A;  . CHOLECYSTECTOMY N/A 11/27/2014   Procedure: LAPAROSCOPIC CHOLECYSTECTOMY WITH INTRAOPERATIVE CHOLANGIOGRAM;  Surgeon: Autumn Messing III, MD;  Location: [facility];  Service: General;  Laterality: N/A;  . CYSTECTOMY      There were no vitals filed for this visit.   Subjective Assessment - 10/31/20 0934     Subjective Pt notes shoulder feels tighter today but denies increased pain.    Diagnostic tests 09/12/20 - L shoulder MRI: 1. Mild tendinosis of the supraspinatus tendon with a small partial-thickness bursal surface tear anteriorly. 2. Mild tendinosis of the infraspinatus tendon. 3. Moderate subacromial/subdeltoid bursitis.    Patient Stated Goals "to make my shoulder get back to normal"    Currently in Pain? 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 & adhesive capsulitis    Referring Provider (PT) Erskine Emery, PA-C    Onset Date/Surgical Date --   >1.5 yrs   Hand Dominance Left    Next MD Visit 11/04/20      AROM   Left Shoulder Flexion 82 Degrees    Left Shoulder ABduction 55 Degrees      PROM   Left Shoulder Flexion 122 Degrees   155 after DN but still experiencing catch upon lowering   Left Shoulder ABduction 138 Degrees    Left Shoulder Internal Rotation 80 Degrees    Left Shoulder External Rotation 72 Degrees  [place] Adult PT Treatment/Exercise - 10/31/20 0932      Exercises   Exercises Shoulder      Shoulder Exercises: Pulleys   Flexion 3 minutes    Scaption 3 minutes      Modalities   Modalities Electrical Stimulation;Moist Heat      Moist Heat Therapy   Number Minutes Moist Heat 15 Minutes    Moist Heat Location Shoulder   Lt     Electrical Stimulation   Electrical Stimulation Location L shoulder    Electrical Stimulation Action IFC    Electrical Stimulation Parameters 80-150 Hz, intensity to pt tol x 15'    Electrical Stimulation Goals Pain;Tone      Manual Therapy   Manual Therapy Soft tissue mobilization;Myofascial release    Manual therapy comments skilled palpation and monitoring during DN    Soft tissue mobilization STM/DTM to L UT, LS, rhomboids, infraspinatus, teres group  and deltoids - ttp throughout and increased tension especially in L UT with taut bands identified in anterolateral deltoid    Myofascial Release Manual TPR to L UT, LS, rhomboids and anterolateral deltoid - improved ROM with decreased "catching" sensation following MT & DN    Passive ROM L shoulder PROM all directions to tolerance - improved flexion, ER, abduction ROM noted however still with intermittent "stuck" sensation reported with lowering from flexion or abduction and returning to neutral from IR            Trigger Point Dry Needling - 10/31/20 0932    Consent Given? Yes    Muscles Treated Head and Neck Upper trapezius;Levator scapulae   Lt   Muscles Treated Upper Quadrant Rhomboids;Infraspinatus;Deltoid;Teres major;Teres minor   Lt   Upper Trapezius Response Twitch reponse elicited;Palpable increased muscle length    Levator Scapulae Response Twitch response elicited;Palpable increased muscle length    Rhomboids Response Twitch response elicited;Palpable increased muscle length    Infraspinatus Response Twitch response elicited;Palpable increased muscle length    Deltoid Response Twitch response elicited;Palpable increased muscle length    Teres major Response Twitch response elicited;Palpable increased muscle length    Teres minor Response Twitch response elicited;Palpable increased muscle length                  PT Short Term Goals - 10/31/20 0936      PT SHORT TERM GOAL #1   Title Patient will be independent with initial HEP    Status Achieved   10/21/20     PT SHORT TERM GOAL #2   Title Patient to improve L shoulder flexion & abduction P/AAROM to >/= 120 dg without pain provocation    Status Partially Met   10/31/20 - met for PROM but continued catching sensation noted with lowering from elevated position   Target Date 10/29/20             PT Long Term Goals - 10/31/20 0937      PT LONG TERM GOAL #1   Title Patient will be independent with ongoing/advanced  HEP    Status On-going    Target Date 11/26/20      PT LONG TERM GOAL #2   Title Patient to improve L shoulder AROM to [facility] without pain provocation    Status On-going    Target Date 11/26/20      PT LONG TERM GOAL #3   Title Patient will demonstrate improved L shoulder strength to >/= 4/5 for functional UE use    Status On-going  Target Date 11/26/20      PT LONG TERM GOAL #4   Title Patient to demonstrate appropriate posture and body mechanics needed for daily activities    Status On-going    Target Date 11/26/20      PT LONG TERM GOAL #5   Title Patient to report ability to perform ADLs, household and work-related tasks without increased L shoulder pain    Status On-going    Target Date 11/26/20                 Plan - 10/31/20 1040    Clinical [place] reports overall pain has improved but still experiences a painful "catch" or "stuck" feeling when attempting to lower her arm from an overhead position when seems to be more irritable over the past few days. AROM and PROM both improved but significant deficit in AROM noted relative to PROM due to catching/locking feeling with extensive increased muscle tension still evident/o L shoulder musculature, especially in UT, LS, rhomboids and anterolateral deltoid which has demonstrates limited response to previous manual therapy efforts due to increased ttp - DN discussed as adjunct to MT with pt expressing interest in proceeding with DN today, therefore DN performed in conjunction with MT today upon informed pt consent. L shoulder flexion PROM improved from 122 to 155 following DN with decreased sensation of "catching" during lowering of the arm also noted - STG #2 partially met. [place] will continue to benefit from skilled PT to help restore functional ROM and strength of L shoulder with decreased pain interference.    Rehab Potential Good    PT Frequency 2x / week    PT Duration 8 weeks    PT  Treatment/Interventions ADLs/Self Care Home Management;Cryotherapy;Electrical Stimulation;Iontophoresis 47m/ml Dexamethasone;Moist Heat;Ultrasound;Therapeutic activities;Therapeutic exercise;Neuromuscular re-education;Patient/family education;Manual techniques;Passive range of motion;Dry needling;Taping;Vasopneumatic Device;Joint Manipulations    PT Next Visit Plan Assess long-term response to DN +/- estim - provide info on home TENS unit if benefit noted; L shoulder P/AAROM to tolerance progressing to AROM and strengthening as able; postural stabilization/strengthening; manual therapy to address pain and increased muscle tension with further DN as benefit noted; modalities PRN for pain    PT Home Exercise Plan 10/5 - pendulums, flexion/scaption P/AAROM table slides; 10/11 - tennis ball massage to infraspinatus; 10/14 - yellow TB row; 10/25 - supine AAROM flexion wand, shoulder circles    Consulted and Agree with Plan of Care Patient           Patient will benefit from skilled therapeutic intervention in order to improve the following deficits and impairments:  Decreased activity tolerance, Decreased range of motion, Decreased strength, Increased muscle spasms, Impaired perceived functional ability, Impaired flexibility, Impaired UE functional use, Improper body mechanics, Postural dysfunction, Pain  Visit Diagnosis: Chronic left shoulder pain  Stiffness of left shoulder, not elsewhere classified  Muscle weakness (generalized)  Abnormal posture  Other symptoms and signs involving the musculoskeletal system     Problem List Patient Active Problem List   Diagnosis Date Noted  . Status post repeat low transverse cesarean section 08/12/2017  . Late prenatal care in third trimester 06/23/2017  . Supervision of high risk pregnancy, antepartum 02/15/2017  . Advanced maternal age in multigravida, unspecified trimester 02/15/2017  . Non-English speaking patient 02/15/2017  . Previous cesarean  delivery, antepartum condition or complication 148/88/9169 . 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  [place], [place], [address] Phone: [phone]   Fax:  [phone]  Name: Gabriela Le MRN: [id number] Date of Birth: 09-26-1976

## 2020-11-04 ENCOUNTER — Encounter: Payer: Self-pay | Admitting: Physician Assistant

## 2020-11-04 ENCOUNTER — Ambulatory Visit (INDEPENDENT_AMBULATORY_CARE_PROVIDER_SITE_OTHER): Payer: 59 | Admitting: Physician Assistant

## 2020-11-04 DIAGNOSIS — L720 Epidermal cyst: Secondary | ICD-10-CM | POA: Insufficient documentation

## 2020-11-04 DIAGNOSIS — B3731 Acute candidiasis of vulva and vagina: Secondary | ICD-10-CM | POA: Insufficient documentation

## 2020-11-04 DIAGNOSIS — B373 Candidiasis of vulva and vagina: Secondary | ICD-10-CM | POA: Insufficient documentation

## 2020-11-04 DIAGNOSIS — M7542 Impingement syndrome of left shoulder: Secondary | ICD-10-CM

## 2020-11-04 DIAGNOSIS — T8140XA Infection following a procedure, unspecified, initial encounter: Secondary | ICD-10-CM | POA: Insufficient documentation

## 2020-11-04 DIAGNOSIS — G8918 Other acute postprocedural pain: Secondary | ICD-10-CM | POA: Insufficient documentation

## 2020-11-04 NOTE — Progress Notes (Signed)
HPI: Gabriela Le returns today follow-up of her left shoulder.  Again she had MRI of her left shoulder which showed a type II acromium and a partial bursal surface tear along with mild tendinitis of the supraspinatus and infraspinatus tendons.  She has been going to physical therapy also had a subacromial injection 09/16/2028.  Feels that she has no pain in the shoulder and feels overall that her range of motion and discomfort in her shoulder are improving with physical therapy.  She presents today with her husband who translates for her.  Physical exam: Left shoulder active forward flexion 90 degrees passively I can bring her to 180 degrees.  Positive impingement testing left shoulder negative on the right.  She has 5/5 strength with external and internal rotation against resistance bilateral shoulders.  Empty can test is negative bilaterally.  Impression: Left shoulder impingement  Plan: Patient requests to continue formal therapy for her left shoulder to work on range of motion and decrease in her pain.  Discussed with her if she has no progress in her range of motion especially with forward flexion actively and continues to have a positive impingement sign would recommend shoulder arthroscopy with  subacromial decompression and extensive debridement.  We will see her back in 1 month to see how she is doing.  Questions were encouraged and answered both of the patient and her husband who was present today throughout the exam.

## 2020-11-05 ENCOUNTER — Ambulatory Visit: Payer: 59

## 2020-11-07 ENCOUNTER — Other Ambulatory Visit: Payer: Self-pay

## 2020-11-07 ENCOUNTER — Ambulatory Visit: Payer: 59 | Admitting: Physical Therapy

## 2020-11-07 DIAGNOSIS — M25512 Pain in left shoulder: Secondary | ICD-10-CM | POA: Diagnosis not present

## 2020-11-07 DIAGNOSIS — R29898 Other symptoms and signs involving the musculoskeletal system: Secondary | ICD-10-CM

## 2020-11-07 DIAGNOSIS — G8929 Other chronic pain: Secondary | ICD-10-CM

## 2020-11-07 DIAGNOSIS — M6281 Muscle weakness (generalized): Secondary | ICD-10-CM

## 2020-11-07 DIAGNOSIS — M25612 Stiffness of left shoulder, not elsewhere classified: Secondary | ICD-10-CM

## 2020-11-07 DIAGNOSIS — R293 Abnormal posture: Secondary | ICD-10-CM

## 2020-11-07 NOTE — Therapy (Signed)
[facility] [address]  [place], [place], [address] Phone: [phone]   Fax:  [phone]  Physical Therapy Treatment  Patient Details  Name: Gabriela Le MRN: [id number] Date of Birth: 1976-10-01 Referring Provider (PT): Erskine Emery, PA-C   Encounter Date: 11/07/2020   PT End of Session - 11/07/20 0935    Visit Number 10    Number of Visits 16    Date for PT Re-Evaluation 11/26/20    Authorization Type Medicaid UHC    Progress Note Due on Visit 16   MD PN on visit #9 - 10/31/20   PT Start Time 0935    PT Stop Time 1036    PT Time Calculation (min) 61 min    Activity Tolerance Patient tolerated treatment well;Patient limited by pain    Behavior During Therapy [facility] for tasks assessed/performed           Past Medical History:  Diagnosis Date  . GERD (gastroesophageal reflux disease)   . H/O cold sores    ACYCLOVIR OINTMENT PRN  . Infertility associated with anovulation    HAS TAKEN CLOMID IN THE PAST  . Irregular menses   . Missed ab 12/29/2010   no surgery required  . 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 APPENDECTOMY     6 YOA  . CESAREAN SECTION  01/06/2013   Procedure: CESAREAN SECTION;  Surgeon: Eldred Manges, MD;  Location: [facility];  Service: Obstetrics;  Laterality: N/A;  Primary  . CESAREAN SECTION N/A 10/09/2014   Procedure: CESAREAN SECTION;  Surgeon: Alinda Dooms, MD;  Location: [facility];  Service: Obstetrics;  Laterality: N/A;  . CESAREAN SECTION N/A 08/09/2017   Procedure: REPEAT CESAREAN SECTION;  Surgeon: Lavonia Drafts, MD;  Location: [facility];  Service: Obstetrics;  Laterality: N/A;  . CHOLECYSTECTOMY N/A 11/27/2014   Procedure: LAPAROSCOPIC CHOLECYSTECTOMY WITH INTRAOPERATIVE CHOLANGIOGRAM;  Surgeon: Autumn Messing III, MD;  Location: [facility];  Service: General;  Laterality: N/A;  . CYSTECTOMY      There were no vitals  filed for this visit.   Subjective Assessment - 11/07/20 0943    Subjective Pt noting relief of the knot in her shoulder that she has had for 2 years following DN last session. She also notes imporved ability to cross her arms over her chest.    Currently in Pain? 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 & adhesive capsulitis    Referring Provider (PT) Erskine Emery, PA-C    Onset Date/Surgical Date --   >1.5 yrs   Hand Dominance Left    Next MD Visit 12/02/20                         [facility] Adult PT Treatment/Exercise - 11/07/20 0935      Exercises   Exercises Shoulder      Shoulder Exercises: Pulleys   Flexion 3 minutes    Scaption 3 minutes      Modalities  Modalities Electrical Stimulation;Moist Heat      Moist Heat Therapy   Number Minutes Moist Heat 15 Minutes    Moist Heat Location Shoulder   Lt     Electrical Stimulation   Electrical Stimulation Location L shoulder    Electrical Stimulation Action IFC    Electrical Stimulation Parameters 80-150 Hz, intensity to pt tol x 15'    Electrical Stimulation Goals Pain;Tone      Manual Therapy   Manual Therapy Soft tissue mobilization;Myofascial release    Manual therapy comments skilled palpation and monitoring during DN    Soft tissue mobilization STM/DTM to L UT, LS, rhomboids, deltoids and pecs - ttp throughout and increased tension especially in L UT with taut bands identified in anterolateral deltoid    Myofascial Release Manual TPR to L UT, LS, rhomboids, pecs and anterolateral deltoid     Passive ROM L shoulder PROM all directions to tolerance - improved flexion, ER, abduction ROM noted however still with intermittent "stuck"  sensation reported with lowering from flexion or abduction and returning to neutral from IR            Trigger Point Dry Needling - 11/07/20 0935    Consent Given? Yes    Education Handout Provided Previously provided    Muscles Treated Head and Neck Upper trapezius;Levator scapulae   Lt   Muscles Treated Upper Quadrant Pectoralis major;Pectoralis minor;Rhomboids;Deltoid   Lt   Upper Trapezius Response Twitch reponse elicited;Palpable increased muscle length    Levator Scapulae Response Twitch response elicited;Palpable increased muscle length    Pectoralis Major Response Twitch response elicited;Palpable increased muscle length    Pectoralis Minor Response Twitch response elicited;Palpable increased muscle length    Rhomboids Response Twitch response elicited;Palpable increased muscle length    Deltoid Response Twitch response elicited;Palpable increased muscle length   anterolateral                 PT Short Term Goals - 10/31/20 0936      PT SHORT TERM GOAL #1   Title Patient will be independent with initial HEP    Status Achieved   10/21/20     PT SHORT TERM GOAL #2   Title Patient to improve L shoulder flexion & abduction P/AAROM to >/= 120 dg without pain provocation    Status Partially Met   10/31/20 - met for PROM but continued catching sensation noted with lowering from elevated position   Target Date 10/29/20             PT Long Term Goals - 10/31/20 0937      PT LONG TERM GOAL #1   Title Patient will be independent with ongoing/advanced HEP    Status On-going    Target Date 11/26/20      PT LONG TERM GOAL #2   Title Patient to improve L shoulder AROM to [facility] without pain provocation    Status On-going    Target Date 11/26/20      PT LONG TERM GOAL #3   Title Patient will demonstrate improved L shoulder strength to >/= 4/5 for functional UE use    Status On-going    Target Date 11/26/20      PT LONG TERM GOAL #4   Title Patient to demonstrate  appropriate posture and body mechanics needed for daily activities    Status On-going    Target Date 11/26/20      PT LONG TERM GOAL #5   Title Patient to report ability  to perform ADLs, household and work-related tasks without increased L shoulder pain    Status On-going    Target Date 11/26/20                 Plan - 11/07/20 1016    Clinical Impression Statement Denny reporting excellent relief of muscle tension and pain following DN last session but states that PA has told her she will need surgery unless she is able to improve her L shoulder AROM significantly by her next f/u visit on 12/02/20. Given positive response to DN, addressed areas of ongoing increased muscle tension and ttp with additional MT and DN - further reduction in ttp and muscle tightness observed post-treatment with estim and moist utilized again to promote further muscle relaxation and pain relief. Will plan to resume focus on ROM and strengthening next visit.    Rehab Potential Good    PT Frequency 2x / week    PT Duration 8 weeks    PT Treatment/Interventions ADLs/Self Care Home Management;Cryotherapy;Electrical Stimulation;Iontophoresis 77m/ml Dexamethasone;Moist Heat;Ultrasound;Therapeutic activities;Therapeutic exercise;Neuromuscular re-education;Patient/family education;Manual techniques;Passive range of motion;Dry needling;Taping;Vasopneumatic Device;Joint Manipulations    PT Next Visit Plan provide info on home TENS unit if benefit noted & pt interested; L shoulder P/AAROM to tolerance progressing to AROM and strengthening as able; postural stabilization/strengthening; manual therapy to address pain and increased muscle tension with further DN as benefit noted; modalities PRN for pain    PT Home Exercise Plan 10/5 - pendulums, flexion/scaption P/AAROM table slides; 10/11 - tennis ball massage to infraspinatus; 10/14 - yellow TB row; 10/25 - supine AAROM flexion wand, shoulder circles    Consulted and Agree with  Plan of Care Patient           Patient will benefit from skilled therapeutic intervention in order to improve the following deficits and impairments:  Decreased activity tolerance, Decreased range of motion, Decreased strength, Increased muscle spasms, Impaired perceived functional ability, Impaired flexibility, Impaired UE functional use, Improper body mechanics, Postural dysfunction, Pain  Visit Diagnosis: Chronic left shoulder pain  Stiffness of left shoulder, not elsewhere classified  Muscle weakness (generalized)  Abnormal posture  Other symptoms and signs involving the musculoskeletal system     Problem List Patient Active Problem List   Diagnosis Date Noted  . Postoperative pain 11/04/2020  . Postoperative infection 11/04/2020  . Epidermoid cyst 11/04/2020  . Delivery of pregnancy by cesarean section 11/04/2020  . Candidal vulvovaginitis 11/04/2020  . Status post repeat low transverse cesarean section 08/12/2017  . Late prenatal care in third trimester 06/23/2017  . Supervision of high risk pregnancy, antepartum 02/15/2017  . Advanced maternal age in multigravida, unspecified trimester 02/15/2017  . Non-English speaking patient 02/15/2017  . Previous cesarean delivery, antepartum condition or complication 112/87/8676 . 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 [place] [address]Phone: [phone]  Fax:  [phone] Name: MVERLISA VARAMRN: [id number] of Birth: 107/06/1976

## 2020-11-11 ENCOUNTER — Ambulatory Visit: Payer: 59

## 2020-11-14 ENCOUNTER — Encounter: Payer: 59 | Admitting: Physical Therapy

## 2020-11-19 ENCOUNTER — Ambulatory Visit: Payer: 59 | Admitting: Physical Therapy

## 2020-11-20 ENCOUNTER — Encounter: Payer: Self-pay | Admitting: Physical Therapy

## 2020-11-25 ENCOUNTER — Other Ambulatory Visit: Payer: Self-pay

## 2020-11-25 ENCOUNTER — Encounter: Payer: Self-pay | Admitting: Physical Therapy

## 2020-11-25 ENCOUNTER — Ambulatory Visit: Payer: 59 | Admitting: Physical Therapy

## 2020-11-25 DIAGNOSIS — M25512 Pain in left shoulder: Secondary | ICD-10-CM

## 2020-11-25 DIAGNOSIS — M6281 Muscle weakness (generalized): Secondary | ICD-10-CM

## 2020-11-25 DIAGNOSIS — R293 Abnormal posture: Secondary | ICD-10-CM

## 2020-11-25 DIAGNOSIS — R29898 Other symptoms and signs involving the musculoskeletal system: Secondary | ICD-10-CM

## 2020-11-25 DIAGNOSIS — M25612 Stiffness of left shoulder, not elsewhere classified: Secondary | ICD-10-CM

## 2020-11-25 NOTE — Therapy (Signed)
[facility] [address]  [place], [place], [address] Phone: [phone]   Fax:  [phone]  Physical Therapy Treatment  Patient Details  Name: Gabriela Le MRN: [id number] Date of Birth: Apr 15, 1976 Referring Provider (PT): Erskine Emery, PA-C   Encounter Date: 11/25/2020   PT End of Session - 11/25/20 1019    Visit Number 11    Number of Visits 16    Date for PT Re-Evaluation 11/26/20    Authorization Type Medicaid UHC    Progress Note Due on Visit 16   MD PN on visit #9 - 10/31/20   PT Start Time 1019    PT Stop Time 1101    PT Time Calculation (min) 42 min    Activity Tolerance Patient tolerated treatment well    Behavior During Therapy [facility] for tasks assessed/performed           Past Medical History:  Diagnosis Date  . GERD (gastroesophageal reflux disease)   . H/O cold sores    ACYCLOVIR OINTMENT PRN  . Infertility associated with anovulation    HAS TAKEN CLOMID IN THE PAST  . Irregular menses   . Missed ab 12/29/2010   no surgery required  . 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 APPENDECTOMY     6 YOA  . CESAREAN SECTION  01/06/2013   Procedure: CESAREAN SECTION;  Surgeon: Eldred Manges, MD;  Location: [facility];  Service: Obstetrics;  Laterality: N/A;  Primary  . CESAREAN SECTION N/A 10/09/2014   Procedure: CESAREAN SECTION;  Surgeon: Alinda Dooms, MD;  Location: [facility];  Service: Obstetrics;  Laterality: N/A;  . CESAREAN SECTION N/A 08/09/2017   Procedure: REPEAT CESAREAN SECTION;  Surgeon: Lavonia Drafts, MD;  Location: [facility];  Service: Obstetrics;  Laterality: N/A;  . CHOLECYSTECTOMY N/A 11/27/2014   Procedure: LAPAROSCOPIC CHOLECYSTECTOMY WITH INTRAOPERATIVE CHOLANGIOGRAM;  Surgeon: Autumn Messing III, MD;  Location: [facility];  Service: General;  Laterality: N/A;  . CYSTECTOMY      There were no vitals filed for this visit.    Subjective Assessment - 11/25/20 1021    Subjective Pt reports her shoulder pain seems better but still having limited overhead motion due to "stuck" feeling with pain when she tries to lower her arm.    Diagnostic tests 09/12/20 - L shoulder MRI: 1. Mild tendinosis of the supraspinatus tendon with a small partial-thickness bursal surface tear anteriorly. 2. Mild tendinosis of the infraspinatus tendon. 3. Moderate subacromial/subdeltoid bursitis.    Patient Stated Goals "to make my shoulder get back to normal"    Currently in Pain? No/denies                             [facility] Adult PT Treatment/Exercise - 11/25/20 1019      Exercises   Exercises Shoulder      Shoulder Exercises: Pulleys   Flexion 3 minutes    Scaption 3 minutes      Manual Therapy   Manual Therapy Joint mobilization;Soft tissue mobilization;Myofascial release;Passive ROM;Muscle Energy Technique    Manual therapy comments skilled palpation and monitoring during DN    Joint Mobilization L shoulder - grade II-III inferior & A/P mobs    Soft tissue mobilization STM/DTM to L UT, LS, deltoids, teres group & lats - ttp throughout and increased tension especially in L UT with taut bands  identified in anterolateral deltoid    Myofascial Release Manual TPR to L UT, anterolateral deltoid, teres group & lats    Passive ROM L shoulder PROM & MWM all directions to tolerance - improved flexion, ER, abduction ROM noted however still with intermittent "stuck" sensation reported with lowering from flexion or abduction    Muscle Energy Technique gentle contract/relax into L shoulder flexion - limited change in motion            Trigger Point Dry Needling - 11/25/20 1019    Consent Given? Yes    Muscles Treated Head and Neck Upper trapezius   Lt   Muscles Treated Upper Quadrant Deltoid;Teres major;Teres minor;Latissimus dorsi   Lt   Upper Trapezius Response Twitch reponse elicited;Palpable increased muscle length     Deltoid Response Twitch response elicited;Palpable increased muscle length   anterolateral   Latissimus dorsi Response Twitch response elicited;Palpable increased muscle length    Teres major Response Twitch response elicited;Palpable increased muscle length    Teres minor Response Twitch response elicited;Palpable increased muscle length                  PT Short Term Goals - 10/31/20 0936      PT SHORT TERM GOAL #1   Title Patient will be independent with initial HEP    Status Achieved   10/21/20     PT SHORT TERM GOAL #2   Title Patient to improve L shoulder flexion & abduction P/AAROM to >/= 120 dg without pain provocation    Status Partially Met   10/31/20 - met for PROM but continued catching sensation noted with lowering from elevated position   Target Date 10/29/20             PT Long Term Goals - 10/31/20 0937      PT LONG TERM GOAL #1   Title Patient will be independent with ongoing/advanced HEP    Status On-going    Target Date 11/26/20      PT LONG TERM GOAL #2   Title Patient to improve L shoulder AROM to [facility] without pain provocation    Status On-going    Target Date 11/26/20      PT LONG TERM GOAL #3   Title Patient will demonstrate improved L shoulder strength to >/= 4/5 for functional UE use    Status On-going    Target Date 11/26/20      PT LONG TERM GOAL #4   Title Patient to demonstrate appropriate posture and body mechanics needed for daily activities    Status On-going    Target Date 11/26/20      PT LONG TERM GOAL #5   Title Patient to report ability to perform ADLs, household and work-related tasks without increased L shoulder pain    Status On-going    Target Date 11/26/20                 Plan - 11/25/20 1101    Clinical [place] notes continued benefit from DN with no pain reported today but still noting limited overhead motion due to "stuck" feeling with pain when trying to lower arm from "stuck" position.  Pt reports limitation seems to come from anterior shoulder with taut bands still identified in L anterolateral deltoid and UT - addressed this with manual therapy including STM/MFR, DN, joint mobs and MWM/PROM. Muscle tension improved following MT however pt continues to note shoulder feels like it gets "stuck" when she tries to  raise her arm above shoulder height.    Rehab Potential Good    PT Frequency 2x / week    PT Duration 8 weeks    PT Treatment/Interventions ADLs/Self Care Home Management;Cryotherapy;Electrical Stimulation;Iontophoresis 65m/ml Dexamethasone;Moist Heat;Ultrasound;Therapeutic activities;Therapeutic exercise;Neuromuscular re-education;Patient/family education;Manual techniques;Passive range of motion;Dry needling;Taping;Vasopneumatic Device;Joint Manipulations    PT Next Visit Plan MD PN for appt 12/6; provide info on home TENS unit if benefit noted & pt interested; L shoulder P/AAROM to tolerance progressing to AROM and strengthening as able; postural stabilization/strengthening; manual therapy to address pain and increased muscle tension with further DN as benefit noted; modalities PRN for pain    PT Home Exercise Plan 10/5 - pendulums, flexion/scaption P/AAROM table slides; 10/11 - tennis ball massage to infraspinatus; 10/14 - yellow TB row; 10/25 - supine AAROM flexion wand, shoulder circles    Consulted and Agree with Plan of Care Patient           Patient will benefit from skilled therapeutic intervention in order to improve the following deficits and impairments:  Decreased activity tolerance, Decreased range of motion, Decreased strength, Increased muscle spasms, Impaired perceived functional ability, Impaired flexibility, Impaired UE functional use, Improper body mechanics, Postural dysfunction, Pain  Visit Diagnosis: Chronic left shoulder pain  Stiffness of left shoulder, not elsewhere classified  Muscle weakness (generalized)  Abnormal posture  Other symptoms  and signs involving the musculoskeletal system     Problem List Patient Active Problem List   Diagnosis Date Noted  . Postoperative pain 11/04/2020  . Postoperative infection 11/04/2020  . Epidermoid cyst 11/04/2020  . Delivery of pregnancy by cesarean section 11/04/2020  . Candidal vulvovaginitis 11/04/2020  . Status post repeat low transverse cesarean section 08/12/2017  . Late prenatal care in third trimester 06/23/2017  . Supervision of high risk pregnancy, antepartum 02/15/2017  . Advanced maternal age in multigravida, unspecified trimester 02/15/2017  . Non-English speaking patient 02/15/2017  . Previous cesarean delivery, antepartum condition or complication 154/27/0623 . 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

## 2020-11-27 ENCOUNTER — Ambulatory Visit: Payer: 59 | Attending: Physician Assistant | Admitting: Physical Therapy

## 2020-11-27 ENCOUNTER — Encounter: Payer: Self-pay | Admitting: Physical Therapy

## 2020-11-27 ENCOUNTER — Other Ambulatory Visit: Payer: Self-pay

## 2020-11-27 DIAGNOSIS — M25512 Pain in left shoulder: Secondary | ICD-10-CM | POA: Diagnosis not present

## 2020-11-27 DIAGNOSIS — R29898 Other symptoms and signs involving the musculoskeletal system: Secondary | ICD-10-CM | POA: Insufficient documentation

## 2020-11-27 DIAGNOSIS — M6281 Muscle weakness (generalized): Secondary | ICD-10-CM | POA: Diagnosis present

## 2020-11-27 DIAGNOSIS — M25612 Stiffness of left shoulder, not elsewhere classified: Secondary | ICD-10-CM | POA: Diagnosis present

## 2020-11-27 DIAGNOSIS — G8929 Other chronic pain: Secondary | ICD-10-CM | POA: Insufficient documentation

## 2020-11-27 DIAGNOSIS — R293 Abnormal posture: Secondary | ICD-10-CM | POA: Insufficient documentation

## 2020-11-27 NOTE — Patient Instructions (Signed)
TENS UNIT  This is helpful for muscle pain and spasm.   Search and Purchase a TENS 7000 2nd edition at www.tenspros.com or www.amazon.com  (It should be less than $30)     TENS unit instructions:   Do not shower or bathe with the unit on  Turn the unit off before removing electrodes or batteries  If the electrodes lose stickiness add a drop of water to the electrodes after they are disconnected from the unit and place on plastic sheet. If you continued to have difficulty, call the TENS unit company to purchase more electrodes.  Do not apply lotion on the skin area prior to use. Make sure the skin is clean and dry as this will help prolong the life of the electrodes.  After use, always check skin for unusual red areas, rash or other skin difficulties. If there are any skin problems, does not apply electrodes to the same area.  Never remove the electrodes from the unit by pulling the wires.  Do not use the TENS unit or electrodes other than as directed.  Do not change electrode placement without consulting your therapist or physician.  Keep 2 fingers with between each electrode.   TENS stands for Transcutaneous Electrical Nerve Stimulation. In other words, electrical impulses are allowed to pass through the skin in order to excite a nerve.   Purpose and Use of TENS:  TENS is a method used to manage acute and chronic pain without the use of drugs. It has been effective in managing pain associated with surgery, sprains, strains, trauma, rheumatoid arthritis, and neuralgias. It is a non-addictive, low risk, and non-invasive technique used to control pain. It is not, by any means, a curative form of treatment.   How TENS Works:  Most TENS units are a [employer] unit powered by one 9 volt battery. Attached to the outside of the unit are two lead wires where two pins and/or snaps connect on each wire. All units come with a set of four reusable pads or electrodes. These are placed  on the skin surrounding the area involved. By inserting the leads into  the pads, the electricity can pass from the unit making the circuit complete.  As the intensity is turned up slowly, the electrical current enters the body from the electrodes through the skin to the surrounding nerve fibers. This triggers the release of hormones from within the body. These hormones contain pain relievers. By increasing the circulation of these hormones, the person's pain may be lessened. It is also believed that the electrical stimulation itself helps to block the pain messages being sent to the brain, thus also decreasing the body's perception of pain.   Hazards:  TENS units are NOT to be used by patients with PACEMAKERS, DEFIBRILLATORS, DIABETIC PUMPS, PREGNANT WOMEN, and patients with SEIZURE DISORDERS.  TENS units are NOT to be used over the heart, throat, brain, or spinal cord.  One of the major side effects from the TENS unit may be skin irritation. Some people may develop a rash if they are sensitive to the materials used in the electrodes or the connecting wires.   Wear the unit for up to 30-45 minutes, up to 3-4 times per day as needed for pain.   Avoid overuse due the body getting used to the stem making it not as effective over time.

## 2020-11-27 NOTE — Therapy (Addendum)
[facility] [address]  [place], [place], [address] Phone: [phone]   Fax:  [phone]  Physical Therapy Treatment / Progress Note / Discharge Summary  Patient Details  Name: Gabriela Le MRN: [id number] Date of Birth: November 18, 1976 Referring Provider (PT): Erskine Emery, PA-C   Encounter Date: 11/27/2020   PT End of Session - 11/27/20 1016    Visit Number 12    Number of Visits 16    Date for PT Re-Evaluation 11/26/20    Authorization Type Medicaid UHC    Progress Note Due on Visit 16   MD PN on visit #9 - 10/31/20   PT Start Time 1016    PT Stop Time 1056    PT Time Calculation (min) 40 min    Activity Tolerance Patient tolerated treatment well    Behavior During Therapy [facility] for tasks assessed/performed           Past Medical History:  Diagnosis Date  . GERD (gastroesophageal reflux disease)   . H/O cold sores    ACYCLOVIR OINTMENT PRN  . Infertility associated with anovulation    HAS TAKEN CLOMID IN THE PAST  . Irregular menses   . Missed ab 12/29/2010   no surgery required  . 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 APPENDECTOMY     6 YOA  . CESAREAN SECTION  01/06/2013   Procedure: CESAREAN SECTION;  Surgeon: Eldred Manges, MD;  Location: [facility];  Service: Obstetrics;  Laterality: N/A;  Primary  . CESAREAN SECTION N/A 10/09/2014   Procedure: CESAREAN SECTION;  Surgeon: Alinda Dooms, MD;  Location: [facility];  Service: Obstetrics;  Laterality: N/A;  . CESAREAN SECTION N/A 08/09/2017   Procedure: REPEAT CESAREAN SECTION;  Surgeon: Lavonia Drafts, MD;  Location: [facility];  Service: Obstetrics;  Laterality: N/A;  . CHOLECYSTECTOMY N/A 11/27/2014   Procedure: LAPAROSCOPIC CHOLECYSTECTOMY WITH INTRAOPERATIVE CHOLANGIOGRAM;  Surgeon: Autumn Messing III, MD;  Location: [facility];  Service: General;  Laterality: N/A;  . CYSTECTOMY      There were  no vitals filed for this visit.   Subjective Assessment - 11/27/20 1019    Subjective Pt. doing ok today.  Feels she has good days and bad days.  Has surgery consult scheduled for Dec 6th.    Diagnostic tests 09/12/20 - L shoulder MRI: 1. Mild tendinosis of the supraspinatus tendon with a small partial-thickness bursal surface tear anteriorly. 2. Mild tendinosis of the infraspinatus tendon. 3. Moderate subacromial/subdeltoid bursitis.    Patient Stated Goals "to make my shoulder get back to normal"    Currently in Pain? 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 & adhesive capsulitis    Referring Provider (PT) Erskine Emery, PA-C    Onset Date/Surgical Date --   >1.5 yrs   Hand Dominance Left    Next MD Visit 12/02/20      AROM   Left Shoulder Flexion 92 Degrees    Left Shoulder ABduction 54 Degrees    Left Shoulder Internal Rotation 70 Degrees   FIR to sacrum   Left Shoulder External Rotation 58 Degrees  PROM   Left Shoulder Flexion 137 Degrees   still experiencing "catch" upon lowering   Left Shoulder ABduction 142 Degrees   still experiencing "catch" upon lowering   Left Shoulder Internal Rotation 79 Degrees    Left Shoulder External Rotation 60 Degrees      Strength   Right Shoulder Flexion 4+/5    Right Shoulder ABduction 4+/5    Right Shoulder Internal Rotation 4+/5    Right Shoulder External Rotation 4+/5    Left Shoulder Flexion 3-/5    Left Shoulder ABduction 2-/5    Left Shoulder Internal Rotation 3+/5    Left Shoulder External Rotation 3-/5                         [facility] Adult PT Treatment/Exercise - 11/27/20 1016      Exercises   Exercises Shoulder      Shoulder Exercises: Pulleys   Flexion 3 minutes    Scaption 3 minutes                  PT Education  - 11/27/20 1056    Education Details Home TENS unit options    Person(s) Educated Patient    Methods Explanation;Handout    Comprehension Verbalized understanding            PT Short Term Goals - 11/27/20 1023      PT SHORT TERM GOAL #1   Title Patient will be independent with initial HEP    Status Achieved   10/21/20     PT SHORT TERM GOAL #2   Title Patient to improve L shoulder flexion & abduction P/AAROM to >/= 120 dg without pain provocation    Status Partially Met   10/31/20 - met for PROM but continued catching sensation noted with lowering from elevated position            PT Long Term Goals - 11/27/20 1024      PT LONG TERM GOAL #1   Title Patient will be independent with ongoing/advanced HEP    Status Partially Met   11/27/20 - notes continued need for self AAROM when lowering arm from overhead activities     PT LONG TERM GOAL #2   Title Patient to improve L shoulder AROM to [facility] without pain provocation    Status Not Met   11/27/20 - limited motion persists, but minimal to no pain     PT LONG TERM GOAL #3   Title Patient will demonstrate improved L shoulder strength to >/= 4/5 for functional UE use    Status Not Met      PT LONG TERM GOAL #4   Title Patient to demonstrate appropriate posture and body mechanics needed for daily activities    Status Achieved   11/27/20     PT LONG TERM GOAL #5   Title Patient to report ability to perform ADLs, household and work-related tasks without increased L shoulder pain    Status Not Met   11/27/20 - pt still relies mostly on R UE unless task just requires hand/elbow motion                Plan - 11/27/20 1024    Clinical Impression Statement Gabriela Le reports pain has mostly resolved since DN other than when shoulder "catches" with overhead motion.  She also reports full resolution of numbness and "cold" feeling in her L arm. L shoulder A/PROM has improved since eval but remains significantly  limited in abduction > flexion  with continued impingement sign as well as significant weakness also apparent even within available ROM. Limited ROM and strength prevents her from using L UE functionally with any daily tasks that requires overhead motion or weight bearing on the L arm (praying in quadruped position). Given remaining deficits, goals only partially met with ROM, strength and functional goals not met. Will hold PT pending MD f/u on 12/02/20 for potential surgical consult.    Rehab Potential Good    PT Treatment/Interventions ADLs/Self Care Home Management;Cryotherapy;Electrical Stimulation;Iontophoresis 4mg /ml Dexamethasone;Moist Heat;Ultrasound;Therapeutic activities;Therapeutic exercise;Neuromuscular re-education;Patient/family education;Manual techniques;Passive range of motion;Dry needling;Taping;Vasopneumatic Device;Joint Manipulations    PT Next Visit Plan 30-day hold pending surgical consult with MD on 12/02/20    PT Home Exercise Plan 10/5 - pendulums, flexion/scaption P/AAROM table slides; 10/11 - tennis ball massage to infraspinatus; 10/14 - yellow TB row; 10/25 - supine AAROM flexion wand, shoulder circles    Consulted and Agree with Plan of Care Patient           Patient will benefit from skilled therapeutic intervention in order to improve the following deficits and impairments:  Decreased activity tolerance, Decreased range of motion, Decreased strength, Increased muscle spasms, Impaired perceived functional ability, Impaired flexibility, Impaired UE functional use, Improper body mechanics, Postural dysfunction, Pain  Visit Diagnosis: Chronic left shoulder pain  Stiffness of left shoulder, not elsewhere classified  Muscle weakness (generalized)  Abnormal posture  Other symptoms and signs involving the musculoskeletal system     Problem List Patient Active Problem List   Diagnosis Date Noted  . Postoperative pain 11/04/2020  . Postoperative infection 11/04/2020  . Epidermoid cyst 11/04/2020   . Delivery of pregnancy by cesarean section 11/04/2020  . Candidal vulvovaginitis 11/04/2020  . Status post repeat low transverse cesarean section 08/12/2017  . Late prenatal care in third trimester 06/23/2017  . Supervision of high risk pregnancy, antepartum 02/15/2017  . Advanced maternal age in multigravida, unspecified trimester 02/15/2017  . Non-English speaking patient 02/15/2017  . Previous cesarean delivery, antepartum condition or complication 64/40/3474  . 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  [place], [place], [address] Phone: [phone]   Fax:  [phone]  Name: ELLAWYN WOGAN MRN: [id number] Date of Birth: July 12, 1976   PHYSICAL THERAPY DISCHARGE SUMMARY  Visits from Start of Care: 12  Current functional level related to goals / functional outcomes:   Refer to above clinical impression for status as of last visit on 11/27/2020. Patient was placed on hold for 30 days pending MD f/u and potential surgical consult. Noted recommendation for L shoulder arthroscopy with extensive debridement and subacromial decompression, therefore will proceed with discharge from PT for this episode.   Remaining deficits:   As above.   Education / Equipment:   HEP  Plan: Patient agrees to discharge.  Patient goals were partially met. Patient is being discharged due to                                                    recommendation for surgical intervention.  ?????     Percival Spanish, PT, MPT 12/31/20, 10:13 AM  [facility] [address]  [place], [place], [address] Phone: [phone]   Fax:  [phone]

## 2020-11-29 ENCOUNTER — Encounter: Payer: Self-pay | Admitting: Family

## 2020-12-02 ENCOUNTER — Other Ambulatory Visit: Payer: Self-pay | Admitting: Family

## 2020-12-02 ENCOUNTER — Encounter: Payer: Self-pay | Admitting: Physician Assistant

## 2020-12-02 ENCOUNTER — Ambulatory Visit (INDEPENDENT_AMBULATORY_CARE_PROVIDER_SITE_OTHER): Payer: 59 | Admitting: Physician Assistant

## 2020-12-02 DIAGNOSIS — M7542 Impingement syndrome of left shoulder: Secondary | ICD-10-CM

## 2020-12-02 DIAGNOSIS — L659 Nonscarring hair loss, unspecified: Secondary | ICD-10-CM

## 2020-12-02 NOTE — Progress Notes (Signed)
HPI: Rheta returns today with her husband due to left shoulder pain.  States that physical therapy is going well.  Is not having real pain in the shoulder but does not have the range of motion she would like to have.  We have tried cortisone injections in her shoulder on 2 occasions and she has been to at least 12 visits with physical therapy.  Again MRI of her left shoulder showed mild tendinosis involving the infraspinatus tendon and supraspinatus tendon moderate subacromial subdeltoid bursitis.  Type II acromion.  Physical exam: Left shoulder she has positive impingement sign.  Abduction actively to approximately 70 degrees.  She is reluctant her any additional abduction passively.  Forward flexion actively 90 degrees passively I can bring her to 180 degrees but this is quite painful.   Impression: Left shoulder impingement  Plan: Due to the fact the patient is failed conservative treatment recommend left shoulder arthroscopy with extensive debridement and subacromial decompression.  Questions were answered by the patient and her husband who was present today throughout the exam.  Questions were encouraged and answered by Dr. Magnus Ivan myself.  Surgical procedure postoperative protocol discussed with patient.  We will see her back 1 week postop.

## 2020-12-25 ENCOUNTER — Inpatient Hospital Stay: Payer: Medicaid Other | Admitting: Orthopaedic Surgery

## 2021-02-18 ENCOUNTER — Other Ambulatory Visit: Payer: Self-pay | Admitting: Physician Assistant

## 2021-02-20 ENCOUNTER — Encounter (HOSPITAL_BASED_OUTPATIENT_CLINIC_OR_DEPARTMENT_OTHER): Payer: Self-pay | Admitting: Orthopaedic Surgery

## 2021-02-20 ENCOUNTER — Other Ambulatory Visit: Payer: Self-pay

## 2021-02-24 ENCOUNTER — Other Ambulatory Visit (HOSPITAL_COMMUNITY): Payer: 59

## 2021-02-25 ENCOUNTER — Other Ambulatory Visit: Payer: Self-pay

## 2021-02-25 ENCOUNTER — Other Ambulatory Visit (HOSPITAL_COMMUNITY)
Admission: RE | Admit: 2021-02-25 | Discharge: 2021-02-25 | Disposition: A | Discharge status: Home / Self Care | Payer: 59 | Source: Ambulatory Visit | Attending: Orthopaedic Surgery | Admitting: Orthopaedic Surgery

## 2021-02-25 DIAGNOSIS — Z20822 Contact with and (suspected) exposure to covid-19: Secondary | ICD-10-CM | POA: Insufficient documentation

## 2021-02-25 DIAGNOSIS — Z01812 Encounter for preprocedural laboratory examination: Secondary | ICD-10-CM | POA: Insufficient documentation

## 2021-02-25 LAB — SARS CORONAVIRUS 2 (TAT 6-24 HRS): SARS Coronavirus 2: NEGATIVE

## 2021-02-27 ENCOUNTER — Other Ambulatory Visit: Payer: Self-pay

## 2021-02-27 ENCOUNTER — Ambulatory Visit (HOSPITAL_BASED_OUTPATIENT_CLINIC_OR_DEPARTMENT_OTHER): Payer: 59 | Admitting: Certified Registered"

## 2021-02-27 ENCOUNTER — Encounter (HOSPITAL_BASED_OUTPATIENT_CLINIC_OR_DEPARTMENT_OTHER)
Admission: RE | Disposition: A | Discharge status: Home / Self Care | Payer: Self-pay | Source: Home / Self Care | Attending: Orthopaedic Surgery

## 2021-02-27 ENCOUNTER — Encounter (HOSPITAL_BASED_OUTPATIENT_CLINIC_OR_DEPARTMENT_OTHER): Payer: Self-pay | Admitting: Orthopaedic Surgery

## 2021-02-27 ENCOUNTER — Ambulatory Visit (HOSPITAL_BASED_OUTPATIENT_CLINIC_OR_DEPARTMENT_OTHER)
Admission: RE | Admit: 2021-02-27 | Discharge: 2021-02-27 | Disposition: A | Discharge status: Home / Self Care | Payer: 59 | Attending: Orthopaedic Surgery | Admitting: Orthopaedic Surgery

## 2021-02-27 DIAGNOSIS — Z79899 Other long term (current) drug therapy: Secondary | ICD-10-CM | POA: Diagnosis not present

## 2021-02-27 DIAGNOSIS — M7542 Impingement syndrome of left shoulder: Secondary | ICD-10-CM | POA: Insufficient documentation

## 2021-02-27 DIAGNOSIS — Z8249 Family history of ischemic heart disease and other diseases of the circulatory system: Secondary | ICD-10-CM | POA: Insufficient documentation

## 2021-02-27 DIAGNOSIS — M75102 Unspecified rotator cuff tear or rupture of left shoulder, not specified as traumatic: Secondary | ICD-10-CM | POA: Insufficient documentation

## 2021-02-27 HISTORY — PX: SHOULDER ARTHROSCOPY WITH SUBACROMIAL DECOMPRESSION: SHX5684

## 2021-02-27 LAB — POCT PREGNANCY, URINE: Preg Test, Ur: NEGATIVE

## 2021-02-27 SURGERY — SHOULDER ARTHROSCOPY WITH SUBACROMIAL DECOMPRESSION
Anesthesia: General | Site: Shoulder | Laterality: Left

## 2021-02-27 MED ORDER — SCOPOLAMINE 1 MG/3DAYS TD PT72
MEDICATED_PATCH | TRANSDERMAL | Status: AC
  Filled 2021-02-27: qty 1

## 2021-02-27 MED ORDER — ACETAMINOPHEN 500 MG PO TABS
1000.0000 mg | ORAL_TABLET | Freq: Once | ORAL | Status: AC
  Administered 2021-02-27: 1000 mg via ORAL

## 2021-02-27 MED ORDER — FENTANYL CITRATE (PF) 100 MCG/2ML IJ SOLN
INTRAMUSCULAR | Status: AC
  Filled 2021-02-27: qty 2

## 2021-02-27 MED ORDER — SUGAMMADEX SODIUM 200 MG/2ML IV SOLN
INTRAVENOUS | Status: DC | PRN
  Administered 2021-02-27: 200 mg via INTRAVENOUS

## 2021-02-27 MED ORDER — PROMETHAZINE HCL 25 MG/ML IJ SOLN: 6.2500 mg | INTRAMUSCULAR | Status: DC | PRN

## 2021-02-27 MED ORDER — ONDANSETRON HCL 4 MG/2ML IJ SOLN
INTRAMUSCULAR | Status: AC
  Filled 2021-02-27: qty 4

## 2021-02-27 MED ORDER — PROPOFOL 10 MG/ML IV BOLUS
INTRAVENOUS | Status: DC | PRN
  Administered 2021-02-27: 120 mg via INTRAVENOUS

## 2021-02-27 MED ORDER — PROPOFOL 10 MG/ML IV BOLUS
INTRAVENOUS | Status: AC
  Filled 2021-02-27: qty 20

## 2021-02-27 MED ORDER — SODIUM CHLORIDE 0.9 % IR SOLN
Status: DC | PRN
  Administered 2021-02-27: 9000 mL

## 2021-02-27 MED ORDER — BUPIVACAINE LIPOSOME 1.3 % IJ SUSP
INTRAMUSCULAR | Status: DC | PRN
  Administered 2021-02-27: 10 mL via PERINEURAL

## 2021-02-27 MED ORDER — PHENYLEPHRINE 40 MCG/ML (10ML) SYRINGE FOR IV PUSH (FOR BLOOD PRESSURE SUPPORT)
PREFILLED_SYRINGE | INTRAVENOUS | Status: AC
  Filled 2021-02-27: qty 10

## 2021-02-27 MED ORDER — CEFAZOLIN SODIUM-DEXTROSE 2-4 GM/100ML-% IV SOLN
2.0000 g | INTRAVENOUS | Status: AC
  Administered 2021-02-27: 2 g via INTRAVENOUS

## 2021-02-27 MED ORDER — ROCURONIUM BROMIDE 100 MG/10ML IV SOLN
INTRAVENOUS | Status: DC | PRN
  Administered 2021-02-27: 40 mg via INTRAVENOUS

## 2021-02-27 MED ORDER — FENTANYL CITRATE (PF) 100 MCG/2ML IJ SOLN
50.0000 ug | Freq: Once | INTRAMUSCULAR | Status: AC
Start: 2021-02-27 — End: 2021-02-27
  Administered 2021-02-27: 50 ug via INTRAVENOUS

## 2021-02-27 MED ORDER — MIDAZOLAM HCL 2 MG/2ML IJ SOLN
INTRAMUSCULAR | Status: AC
  Filled 2021-02-27: qty 2

## 2021-02-27 MED ORDER — DEXAMETHASONE SODIUM PHOSPHATE 10 MG/ML IJ SOLN
INTRAMUSCULAR | Status: AC
  Filled 2021-02-27: qty 1

## 2021-02-27 MED ORDER — PHENYLEPHRINE 40 MCG/ML (10ML) SYRINGE FOR IV PUSH (FOR BLOOD PRESSURE SUPPORT)
PREFILLED_SYRINGE | INTRAVENOUS | Status: DC | PRN
  Administered 2021-02-27 (×6): 40 ug via INTRAVENOUS

## 2021-02-27 MED ORDER — CEFAZOLIN SODIUM-DEXTROSE 2-4 GM/100ML-% IV SOLN
INTRAVENOUS | Status: AC
  Filled 2021-02-27: qty 100

## 2021-02-27 MED ORDER — EPHEDRINE SULFATE-NACL 50-0.9 MG/10ML-% IV SOSY
PREFILLED_SYRINGE | INTRAVENOUS | Status: DC | PRN
  Administered 2021-02-27: 5 mg via INTRAVENOUS

## 2021-02-27 MED ORDER — MIDAZOLAM HCL 2 MG/2ML IJ SOLN
1.0000 mg | Freq: Once | INTRAMUSCULAR | Status: AC
  Administered 2021-02-27: 1 mg via INTRAVENOUS

## 2021-02-27 MED ORDER — SCOPOLAMINE 1 MG/3DAYS TD PT72
1.0000 | MEDICATED_PATCH | Freq: Once | TRANSDERMAL | Status: DC
  Administered 2021-02-27: 1.5 mg via TRANSDERMAL

## 2021-02-27 MED ORDER — FENTANYL CITRATE (PF) 100 MCG/2ML IJ SOLN
50.0000 ug | Freq: Once | INTRAMUSCULAR | Status: AC
  Administered 2021-02-27: 50 ug via INTRAVENOUS

## 2021-02-27 MED ORDER — PROPOFOL 500 MG/50ML IV EMUL
INTRAVENOUS | Status: DC | PRN
  Administered 2021-02-27: 25 ug/kg/min via INTRAVENOUS

## 2021-02-27 MED ORDER — FENTANYL CITRATE (PF) 100 MCG/2ML IJ SOLN: 25.0000 ug | INTRAMUSCULAR | Status: DC | PRN

## 2021-02-27 MED ORDER — LIDOCAINE 2% (20 MG/ML) 5 ML SYRINGE
INTRAMUSCULAR | Status: AC
  Filled 2021-02-27: qty 5

## 2021-02-27 MED ORDER — ROCURONIUM BROMIDE 10 MG/ML (PF) SYRINGE
PREFILLED_SYRINGE | INTRAVENOUS | Status: AC
  Filled 2021-02-27: qty 10

## 2021-02-27 MED ORDER — LACTATED RINGERS IV SOLN: INTRAVENOUS | Status: DC

## 2021-02-27 MED ORDER — ACETAMINOPHEN 500 MG PO TABS
ORAL_TABLET | ORAL | Status: AC
  Filled 2021-02-27: qty 2

## 2021-02-27 MED ORDER — HYDROCODONE-ACETAMINOPHEN 5-325 MG PO TABS: 1.0000 | ORAL_TABLET | Freq: Four times a day (QID) | ORAL | 0 refills | Status: DC | PRN

## 2021-02-27 MED ORDER — BUPIVACAINE-EPINEPHRINE (PF) 0.5% -1:200000 IJ SOLN
INTRAMUSCULAR | Status: DC | PRN
  Administered 2021-02-27: 15 mL via PERINEURAL

## 2021-02-27 MED ORDER — OXYCODONE HCL 5 MG PO TABS
5.0000 mg | ORAL_TABLET | Freq: Once | ORAL | Status: DC | PRN
Start: 2021-02-27 — End: 2021-02-27

## 2021-02-27 MED ORDER — LIDOCAINE 2% (20 MG/ML) 5 ML SYRINGE
INTRAMUSCULAR | Status: DC | PRN
  Administered 2021-02-27: 60 mg via INTRAVENOUS

## 2021-02-27 MED ORDER — FENTANYL CITRATE (PF) 100 MCG/2ML IJ SOLN
INTRAMUSCULAR | Status: DC | PRN
  Administered 2021-02-27: 50 ug via INTRAVENOUS

## 2021-02-27 MED ORDER — DEXAMETHASONE SODIUM PHOSPHATE 10 MG/ML IJ SOLN
INTRAMUSCULAR | Status: DC | PRN
  Administered 2021-02-27: 5 mg via INTRAVENOUS

## 2021-02-27 MED ORDER — OXYCODONE HCL 5 MG/5ML PO SOLN: 5.0000 mg | Freq: Once | ORAL | Status: DC | PRN

## 2021-02-27 MED ORDER — ONDANSETRON HCL 4 MG/2ML IJ SOLN
INTRAMUSCULAR | Status: DC | PRN
  Administered 2021-02-27 (×2): 4 mg via INTRAVENOUS

## 2021-02-27 MED ORDER — EPHEDRINE 5 MG/ML INJ
INTRAVENOUS | Status: AC
  Filled 2021-02-27: qty 10

## 2021-02-27 SURGICAL SUPPLY — 60 items
BLADE SURG 15 STRL LF DISP TIS (BLADE) IMPLANT
BLADE SURG 15 STRL SS (BLADE)
BURR OVAL 8 FLU 4.0X13 (MISCELLANEOUS) ×2 IMPLANT
BURR OVAL 8 FLU 5.0X13 (MISCELLANEOUS) IMPLANT
CANNULA TWIST IN 8.25X7CM (CANNULA) IMPLANT
COVER WAND RF STERILE (DRAPES) IMPLANT
DECANTER SPIKE VIAL GLASS SM (MISCELLANEOUS) IMPLANT
DISSECTOR  3.8MM X 13CM (MISCELLANEOUS) ×1
DISSECTOR 3.8MM X 13CM (MISCELLANEOUS) ×1 IMPLANT
DISSECTOR 4.0MM X 13CM (MISCELLANEOUS) ×2 IMPLANT
DRAPE IMP U-DRAPE 54X76 (DRAPES) ×2 IMPLANT
DRAPE SHOULDER BEACH CHAIR (DRAPES) ×2 IMPLANT
DRAPE U-SHAPE 47X51 STRL (DRAPES) ×2 IMPLANT
DRSG PAD ABDOMINAL 8X10 ST (GAUZE/BANDAGES/DRESSINGS) ×2 IMPLANT
DURAPREP 26ML APPLICATOR (WOUND CARE) ×2 IMPLANT
ELECT REM PT RETURN 9FT ADLT (ELECTROSURGICAL)
ELECTRODE REM PT RTRN 9FT ADLT (ELECTROSURGICAL) IMPLANT
GAUZE SPONGE 4X4 12PLY STRL (GAUZE/BANDAGES/DRESSINGS) ×2 IMPLANT
GAUZE XEROFORM 1X8 LF (GAUZE/BANDAGES/DRESSINGS) ×2 IMPLANT
GLOVE BIOGEL M 6.5 STRL (GLOVE) ×2 IMPLANT
GLOVE SRG 8 PF TXTR STRL LF DI (GLOVE) ×2 IMPLANT
GLOVE SURG ENC MOIS LTX SZ7.5 (GLOVE) ×2 IMPLANT
GLOVE SURG ORTHO LTX SZ8 (GLOVE) ×2 IMPLANT
GLOVE SURG POLYISO LF SZ7 (GLOVE) ×2 IMPLANT
GLOVE SURG UNDER POLY LF SZ6.5 (GLOVE) ×4 IMPLANT
GLOVE SURG UNDER POLY LF SZ7 (GLOVE) ×2 IMPLANT
GLOVE SURG UNDER POLY LF SZ8 (GLOVE) ×2
GOWN STRL REUS W/ TWL LRG LVL3 (GOWN DISPOSABLE) ×2 IMPLANT
GOWN STRL REUS W/ TWL XL LVL3 (GOWN DISPOSABLE) ×2 IMPLANT
GOWN STRL REUS W/TWL LRG LVL3 (GOWN DISPOSABLE) ×2
GOWN STRL REUS W/TWL XL LVL3 (GOWN DISPOSABLE) ×2
KIT SHOULDER TRACTION (DRAPES) ×2 IMPLANT
MANIFOLD NEPTUNE II (INSTRUMENTS) ×2 IMPLANT
NDL SAFETY ECLIPSE 18X1.5 (NEEDLE) IMPLANT
NEEDLE HYPO 18GX1.5 SHARP (NEEDLE)
NEEDLE SCORPION MULTI FIRE (NEEDLE) IMPLANT
NS IRRIG 1000ML POUR BTL (IV SOLUTION) IMPLANT
PACK ARTHROSCOPY DSU (CUSTOM PROCEDURE TRAY) ×2 IMPLANT
PACK BASIN DAY SURGERY FS (CUSTOM PROCEDURE TRAY) ×2 IMPLANT
PAD ORTHO SHOULDER 7X19 LRG (SOFTGOODS) IMPLANT
PENCIL SMOKE EVACUATOR (MISCELLANEOUS) IMPLANT
PORT APPOLLO RF 90DEGREE MULTI (SURGICAL WAND) ×2 IMPLANT
RESTRAINT HEAD UNIVERSAL NS (MISCELLANEOUS) ×2 IMPLANT
SLING ARM FOAM STRAP LRG (SOFTGOODS) IMPLANT
SLING ARM FOAM STRAP MED (SOFTGOODS) ×2 IMPLANT
SLING ULTRA II MEDIUM (SOFTGOODS) IMPLANT
SPONGE LAP 4X18 RFD (DISPOSABLE) IMPLANT
SUCTION FRAZIER HANDLE 10FR (MISCELLANEOUS)
SUCTION TUBE FRAZIER 10FR DISP (MISCELLANEOUS) IMPLANT
SUT ETHIBOND 2 OS 4 DA (SUTURE) IMPLANT
SUT ETHILON 3 0 PS 1 (SUTURE) ×2 IMPLANT
SUT VIC AB 2-0 SH 27 (SUTURE)
SUT VIC AB 2-0 SH 27XBRD (SUTURE) IMPLANT
SYR 20ML LL LF (SYRINGE) IMPLANT
SYR BULB EAR ULCER 3OZ GRN STR (SYRINGE) IMPLANT
TAPE HYPAFIX 6X30 (GAUZE/BANDAGES/DRESSINGS) ×2 IMPLANT
TOWEL GREEN STERILE FF (TOWEL DISPOSABLE) ×2 IMPLANT
TUBE CONNECTING 20X1/4 (TUBING) ×2 IMPLANT
TUBING ARTHROSCOPY IRRIG 16FT (MISCELLANEOUS) ×2 IMPLANT
WATER STERILE IRR 1000ML POUR (IV SOLUTION) ×2 IMPLANT

## 2021-02-27 NOTE — Anesthesia Procedure Notes (Signed)
Procedure Name: Intubation Date/Time: 02/27/2021 10:50 AM Performed by: Marny Lowenstein, CRNA Pre-anesthesia Checklist: Patient identified, Emergency Drugs available, Suction available and Patient being monitored Patient Re-evaluated:Patient Re-evaluated prior to induction Oxygen Delivery Method: Circle system utilized Preoxygenation: Pre-oxygenation with 100% oxygen Induction Type: IV induction Ventilation: Mask ventilation without difficulty Laryngoscope Size: Miller and 2 Grade View: Grade I Tube type: Oral Tube size: 6.5 mm Number of attempts: 1 Airway Equipment and Method: Patient positioned with wedge pillow and Stylet Placement Confirmation: ETT inserted through vocal cords under direct vision,  positive ETCO2 and breath sounds checked- equal and bilateral Secured at: 21 cm Tube secured with: Tape Dental Injury: Teeth and Oropharynx as per pre-operative assessment

## 2021-02-27 NOTE — Anesthesia Postprocedure Evaluation (Signed)
Anesthesia Post Note  Patient: Gabriela Le  Procedure(s) Performed: LEFT SHOULDER ARTHROSCOPY WITH DEBRIDEMENT AND SUBACROMIAL DECOMPRESSION (Left Shoulder)     Patient location during evaluation: PACU Anesthesia Type: General Level of consciousness: awake and alert and oriented Pain management: pain level controlled Vital Signs Assessment: post-procedure vital signs reviewed and stable Respiratory status: spontaneous breathing, nonlabored ventilation and respiratory function stable Cardiovascular status: blood pressure returned to baseline Postop Assessment: no apparent nausea or vomiting Anesthetic complications: no   No complications documented.  Last Vitals:  Vitals:   02/27/21 1230 02/27/21 1300  BP: (!) 150/94 113/89  Pulse: 77 91  Resp: 12 14  Temp:  36.4 C  SpO2: 99% 100%    Last Pain:  Vitals:   02/27/21 1247  TempSrc:   PainSc: 0-No pain                 Kaylyn Layer

## 2021-02-27 NOTE — Progress Notes (Signed)
Assisted Dr. Howze with left, ultrasound guided, interscalene  block. Side rails up, monitors on throughout procedure. See vital signs in flow sheet. Tolerated Procedure well. 

## 2021-02-27 NOTE — Op Note (Signed)
NAMEMAHUM, BETTEN MEDICAL RECORD NO: [id number] ACCOUNT NO: [id number] DATE OF BIRTH: 06/21/1976 FACILITY: [facility] LOCATION: [facility] PHYSICIAN: Vanita Panda. 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  PROCEDURE:  Left shoulder arthroscopy with extensive debridement and subacromial decompression.  SURGEON:  Vanita Panda. Magnus Ivan, MD  ASSISTANT:  Richardean Canal, PA-C  ANESTHESIA: 1.  Regional left shoulder block. 2.  General.  ESTIMATED BLOOD LOSS:  Minimal.  COMPLICATIONS:  None.  INDICATIONS:  The patient is a 45 year old female with significant pain of her left shoulder with overhead activities and reaching behind her.  We had tried conservative treatment with anti-inflammatories and activity modification as well as therapy and  steroid injections.  MRI was obtained of the shoulder that showed tendinosis of the rotator cuff and impingement, but no tear.  At this point with a very conservative treatment and continued pain with activity she did wish for an arthroscopic  intervention and we agreed to this as well.  We had a long and thorough discussion about the risks and benefits of this procedure and she did wish to proceed.  DESCRIPTION OF PROCEDURE:  After informed consent was obtained, appropriate left shoulder was marked.  Anesthesia obtained, a regional shoulder block in the holding room and then she was brought to the operating room and placed supine on the operating  table.  General anesthesia was then obtained.  She was then fashioned to beach chair position with appropriate padding.  The head and neck and bending at the waist and knees and appropriate positioning as well as the head and neck, down  nonoperative  right arm was padded appropriately as well.  Her left shoulder was prepped and draped with DuraPrep and sterile drapes.  A timeout was called and she was identified as the correct patient and correct left shoulder.  We did place her in a fishing pole  traction device with 10 pounds of traction, 45 degrees, forward flexion.  We then made a posterolateral arthroscopy portal and entered the glenohumeral joint.  We found some synovitis in the glenohumeral joint, but nothing significant.  We did make an  incision through the rotator interval and entered the glenohumeral joint and was able to perform a minimal debridement in the glenohumeral joint.  We then entered the subacromial space through the posterior portal.  We had to make 2 separate lateral  portals due to her being so small to get to where we wanted to get.  We found significant tendinosis of the rotator cuff and used a soft tissue ablation wand and arthroscopic shaver to perform a significant bursectomy in this area as well as removing  thickened tissue and all off the rotator cuff.  We then put the shoulder through internal rotation and external rotation.  It did move as a unit, I found a hole in the rotator cuff anteriorly and toward the glenoid, but no tissue to repair.  The majority  of the cuff was completely intact.  We did not have to perform any acromioplasty, but did decompress the subacromial space using the arthroscopic shaver running this underneath the acromion.  She did have a good space.  We then removed all  instrumentation from the shoulder and allowed for lavage of the shoulder, we then drained everything we  could and closed all portal sites with interrupted nylon suture.  Xeroform well-padded sterile dressing was applied and the shoulder was placed in a  sling.  She was awakened, extubated, and taken to recovery room in stable condition with all final counts being correct and no complications noted.   PUS D:  02/27/2021 11:47:43 am T: 02/27/2021 12:40:00 pm  JOB: [id number]/ [id number]

## 2021-02-27 NOTE — Discharge Instructions (Signed)
Post Anesthesia Home Care Instructions  Activity: Get plenty of rest for the remainder of the day. A responsible individual must stay with you for 24 hours following the procedure.  For the next 24 hours, DO NOT: -Drive a car -Paediatric nurse -Drink alcoholic beverages -Take any medication unless instructed by your physician -Make any legal decisions or sign important papers.  Meals: Start with liquid foods such as gelatin or soup. Progress to regular foods as tolerated. Avoid greasy, spicy, heavy foods. If nausea and/or vomiting occur, drink only clear liquids until the nausea and/or vomiting subsides. Call your physician if vomiting continues.  Special Instructions/Symptoms: Your throat may feel dry or sore from the anesthesia or the breathing tube placed in your throat during surgery. If this causes discomfort, gargle with warm salt water. The discomfort should disappear within 24 hours.  If you had a scopolamine patch placed behind your ear for the management of post- operative nausea and/or vomiting:  1. The medication in the patch is effective for 72 hours, after which it should be removed.  Wrap patch in a tissue and discard in the trash. Wash hands thoroughly with soap and water. 2. You may remove the patch earlier than 72 hours if you experience unpleasant side effects which may include dry mouth, dizziness or visual disturbances. 3. Avoid touching the patch. Wash your hands with soap and water after contact with the patch.       Regional Anesthesia Blocks  1. Numbness or the inability to move the "blocked" extremity may last from 3-48 hours after placement. The length of time depends on the medication injected and your individual response to the medication. If the numbness is not going away after 48 hours, call your surgeon.  2. The extremity that is blocked will need to be protected until the numbness is gone and the  Strength has returned. Because you cannot feel it, you  will need to take extra care to avoid injury. Because it may be weak, you may have difficulty moving it or using it. You may not know what position it is in without looking at it while the block is in effect.  3. For blocks in the legs and feet, returning to weight bearing and walking needs to be done carefully. You will need to wait until the numbness is entirely gone and the strength has returned. You should be able to move your leg and foot normally before you try and bear weight or walk. You will need someone to be with you when you first try to ensure you do not fall and possibly risk injury.  4. Bruising and tenderness at the needle site are common side effects and will resolve in a few days.  5. Persistent numbness or new problems with movement should be communicated to the surgeon or the [place] [phone] [place] [phone]).   Information for Discharge Teaching: EXPAREL (bupivacaine liposome injectable suspension)   Your surgeon or anesthesiologist gave you EXPAREL(bupivacaine) to help control your pain after surgery.   EXPAREL is a local anesthetic that provides pain relief by numbing the tissue around the surgical site.  EXPAREL is designed to release pain medication over time and can control pain for up to 72 hours.  Depending on how you respond to EXPAREL, you may require less pain medication during your recovery.  Possible side effects:  Temporary loss of sensation or ability to move in the area where bupivacaine was injected.  Nausea, vomiting, constipation  Rarely, numbness and tingling in your mouth or lips, lightheadedness, or anxiety may occur.  Call your doctor right away if you think you may be experiencing any of these sensations, or if you have other questions regarding possible side effects.  Follow all other discharge instructions given to you by your surgeon or nurse. Eat a healthy diet and drink plenty of water or  other fluids.  If you return to the hospital for any reason within 96 hours following the administration of EXPAREL, it is important for health care providers to know that you have received this anesthetic. A teal colored band has been placed on your arm with the date, time and amount of EXPAREL you have received in order to alert and inform your health care providers. Please leave this armband in place for the full 96 hours following administration, and then you may remove the band.   Do expect left shoulder swelling -ice as needed periodically throughout the day. Do expect bloody drainage from the shoulder incisions. In 24 to 48 hours you may remove your dressings and get your shoulder wet in the shower daily. Place Band-Aids over your incisions daily after each shower. Do come in and out of your sling to work on range of motion of your shoulder as comfort allows.

## 2021-02-27 NOTE — H&P (Signed)
Gabriela Le is an 45 y.o. female.   Chief Complaint: left shoulder pain HPI:  45 yo female with known impingement syndrome involving her left shoulder.  Has tried and failed conservative treatment.  At this point, arthroscopic surgery has been recommended.  Past Medical History:  Diagnosis Date  . GERD (gastroesophageal reflux disease)   . H/O cold sores    ACYCLOVIR OINTMENT PRN  . Infertility associated with anovulation    HAS TAKEN CLOMID IN THE PAST  . Irregular menses   . Missed ab 12/29/2010   no surgery required  . 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 APPENDECTOMY     6 YOA  . CESAREAN SECTION  01/06/2013   Procedure: CESAREAN SECTION;  Surgeon: Hal Morales, MD;  Location: [facility];  Service: Obstetrics;  Laterality: N/A;  Primary  . CESAREAN SECTION N/A 10/09/2014   Procedure: CESAREAN SECTION;  Surgeon: Konrad Felix, MD;  Location: [facility];  Service: Obstetrics;  Laterality: N/A;  . CESAREAN SECTION N/A 08/09/2017   Procedure: REPEAT CESAREAN SECTION;  Surgeon: Willodean Rosenthal, MD;  Location: [facility]TH[facility] SU[facility];  Service: Obstetrics;  Laterality: N/A;  . CHOLECYSTECTOMY N/A 11/27/2014   Procedure: LAPAROSCOPIC CHOLECYSTECTOMY WITH INTRAOPERATIVE CHOLANGIOGRAM;  Surgeon: Chevis Pretty III, MD;  Location: [facility];  Service: General;  Laterality: N/A;  . CYSTECTOMY      Family History  Problem Relation Age of Onset  . Heart disease Mother        PALPITATIONS  . Hypertension Mother    Social History:  reports that she has never smoked. She has never used smokeless tobacco. She reports that she does not drink alcohol and does not use drugs.  Allergies: No Known Allergies  Medications Prior to Admission  Medication Sig Dispense Refill  . fexofenadine (ALLEGRA) 180 MG tablet Take 1 tablet (180 mg total) by mouth daily. 90 tablet 3    Results for orders placed or performed during the hospital encounter of 02/27/21  (from the past 48 hour(s))  Pregnancy, urine POC     Status: None   Collection Time: 02/27/21  8:34 AM  Result Value Ref Range   Preg Test, Ur NEGATIVE NEGATIVE    Comment:        THE SENSITIVITY OF THIS METHODOLOGY IS >24 mIU/mL    No results found.  Review of Systems  All other systems reviewed and are negative.   Blood pressure (!) 159/85, pulse 97, temperature (!) 97.5 F (36.4 C), temperature source Oral, resp. rate 17, height 5' (1.524 m), weight 58.1 kg, last menstrual period 02/23/2021, SpO2 100 %. Physical Exam Vitals reviewed.  Constitutional:      Appearance: Normal appearance.  Eyes:     Extraocular Movements: Extraocular movements intact.     Pupils: Pupils are equal, round, and reactive to light.  Cardiovascular:     Rate and Rhythm: Normal rate and regular rhythm.  Pulmonary:     Effort: Pulmonary effort is normal.     Breath sounds: Normal breath sounds.  Abdominal:     Palpations: Abdomen is soft.  Musculoskeletal:     Left shoulder: Tenderness and bony tenderness present. Decreased range of motion. Decreased strength.     Cervical back: Normal range of motion.  Neurological:     Mental Status: She is alert and oriented to person, place, and time.  Psychiatric:        Behavior: Behavior normal.  Assessment/Plan Left shoulder impingement syndrome  The patient understands today through the interpreter that we are proceeding to surgery for a left shoulder arthroscopy with debridement and subacromial decompression.  Shown her a shoulder model in the office I explained in detail with the surgery involves.  Given the failed conservative treatment I feel this is warranted as does she.  The risks and benefits of surgery have been discussed in detail as well as what to expect from an intraoperative and postoperative course.  Informed consent is obtained and the left shoulder has been marked.  [name], MD 02/27/2021, 10:30 AM

## 2021-02-27 NOTE — Anesthesia Procedure Notes (Signed)
Anesthesia Regional Block: Interscalene brachial plexus block   Pre-Anesthetic Checklist: ,, timeout performed, Correct Patient, Correct Site, Correct Laterality, Correct Procedure, Correct Position, site marked, Risks and benefits discussed, pre-op evaluation,  At surgeon's request and post-op pain management  Laterality: Left  Prep: Maximum Sterile Barrier Precautions used, chloraprep       Needles:  Injection technique: Single-shot  Needle Type: Echogenic Stimulator Needle     Needle Length: 4cm  Needle Gauge: 22     Additional Needles:   Procedures:,,,, ultrasound used (permanent image in chart),,,,  Narrative:  Start time: 02/27/2021 10:01 AM End time: 02/27/2021 10:04 AM Injection made incrementally with aspirations every 5 mL.  Performed by: Personally  Anesthesiologist: Kaylyn Layer, MD  Additional Notes: Risks, benefits, and alternative discussed. Patient gave consent for procedure. Patient prepped and draped in sterile fashion. Sedation administered, patient remains easily responsive to voice. Relevant anatomy identified with ultrasound guidance. Local anesthetic given in 5cc increments with no signs or symptoms of intravascular injection. No pain or paraesthesias with injection. Patient monitored throughout procedure with signs of LAST or immediate complications. Tolerated well. Ultrasound image placed in chart.  Amalia Greenhouse, MD

## 2021-02-27 NOTE — Anesthesia Preprocedure Evaluation (Addendum)
Anesthesia Evaluation  Patient identified by MRN, date of birth, ID band Patient awake    Reviewed: Allergy & Precautions, NPO status , Patient's Chart, lab work & pertinent test results  History of Anesthesia Complications Negative for: history of anesthetic complications  Airway Mallampati: II  TM Distance: >3 FB Neck ROM: Full    Dental no notable dental hx.    Pulmonary neg pulmonary ROS,    Pulmonary exam normal        Cardiovascular negative cardio ROS Normal cardiovascular exam     Neuro/Psych negative neurological ROS  negative psych ROS   GI/Hepatic Neg liver ROS, GERD  Controlled,  Endo/Other  negative endocrine ROS  Renal/GU negative Renal ROS  negative genitourinary   Musculoskeletal left shoulder impingement   Abdominal   Peds  Hematology negative hematology ROS (+)   Anesthesia Other Findings Day of surgery medications reviewed with patient.  Reproductive/Obstetrics negative OB ROS                            Anesthesia Physical Anesthesia Plan  ASA: II  Anesthesia Plan: General   Post-op Pain Management: GA combined w/ Regional for post-op pain   Induction: Intravenous  PONV Risk Score and Plan: 3 and Treatment may vary due to age or medical condition, Ondansetron, Dexamethasone, Midazolam and Scopolamine patch - Pre-op  Airway Management Planned: Oral ETT  Additional Equipment: None  Intra-op Plan:   Post-operative Plan: Extubation in OR  Informed Consent: I have reviewed the patients History and Physical, chart, labs and discussed the procedure including the risks, benefits and alternatives for the proposed anesthesia with the patient or authorized representative who has indicated his/her understanding and acceptance.     Dental advisory given and Interpreter used for interveiw  Plan Discussed with: CRNA  Anesthesia Plan Comments:        Anesthesia  Quick Evaluation

## 2021-02-27 NOTE — Transfer of Care (Signed)
Immediate Anesthesia Transfer of Care Note  Patient: Gabriela Le  Procedure(s) Performed: LEFT SHOULDER ARTHROSCOPY WITH DEBRIDEMENT AND SUBACROMIAL DECOMPRESSION (Left Shoulder)  Patient Location: PACU  Anesthesia Type:General and Regional  Level of Consciousness: drowsy and patient cooperative  Airway & Oxygen Therapy: Patient Spontanous Breathing and Patient connected to face mask oxygen  Post-op Assessment: Report given to RN and Post -op Vital signs reviewed and stable  Post vital signs: Reviewed and stable  Last Vitals:  Vitals Value Taken Time  BP 158/84 02/27/21 1203  Temp    Pulse 86 02/27/21 1206  Resp 14 02/27/21 1206  SpO2 100 % 02/27/21 1206  Vitals shown include unvalidated device data.  Last Pain:  Vitals:   02/27/21 0850  TempSrc: Oral  PainSc: 0-No pain         Complications: No complications documented.

## 2021-02-27 NOTE — Brief Op Note (Signed)
02/27/2021  11:54 AM  PATIENT:  Gabriela Le  45 y.o. female  PRE-OPERATIVE DIAGNOSIS:  left shoulder impingement  POST-OPERATIVE DIAGNOSIS:  left shoulder impingement  PROCEDURE:  Procedure(s): LEFT SHOULDER ARTHROSCOPY WITH DEBRIDEMENT AND SUBACROMIAL DECOMPRESSION (Left)  SURGEON:  Surgeon(s) and Role:    [name], MD - Primary  PHYSICIAN ASSISTANT:  Rexene Edison, PA-C  ANESTHESIA:   regional and general  EBL:  10 mL   COUNTS:  YES  DICTATION: .Other Dictation: Dictation Number [id number]  PLAN OF CARE: Discharge to home after PACU  PATIENT DISPOSITION:  PACU - hemodynamically stable.   Delay start of Pharmacological VTE agent (>24hrs) due to surgical blood loss or risk of bleeding: no

## 2021-02-28 ENCOUNTER — Telehealth: Payer: Self-pay | Admitting: Orthopaedic Surgery

## 2021-02-28 ENCOUNTER — Encounter (HOSPITAL_BASED_OUTPATIENT_CLINIC_OR_DEPARTMENT_OTHER): Payer: Self-pay | Admitting: Orthopaedic Surgery

## 2021-02-28 MED ORDER — HYDROCODONE-ACETAMINOPHEN 5-325 MG PO TABS: 1.0000 | ORAL_TABLET | Freq: Four times a day (QID) | ORAL | 0 refills | Status: DC | PRN

## 2021-02-28 NOTE — Telephone Encounter (Signed)
IC patient asking for medication to be resubmitted to [facility]

## 2021-02-28 NOTE — Telephone Encounter (Signed)
Pts Husband called stating the pharmacy the pts Norco rx went to has been closed for a week with no sign of reopening; pt would like to know if they can have the rx written out and left at the front desk so they can take it to another pharmacy? Pt would like a CB if this is possible and when it is ready for pick up.  [phone]

## 2021-02-28 NOTE — Telephone Encounter (Signed)
I'll send some in there.

## 2021-03-06 ENCOUNTER — Encounter: Payer: Self-pay | Admitting: Physician Assistant

## 2021-03-06 ENCOUNTER — Ambulatory Visit (INDEPENDENT_AMBULATORY_CARE_PROVIDER_SITE_OTHER): Payer: 59 | Admitting: Physician Assistant

## 2021-03-06 DIAGNOSIS — M7502 Adhesive capsulitis of left shoulder: Secondary | ICD-10-CM

## 2021-03-06 DIAGNOSIS — Z9889 Other specified postprocedural states: Secondary | ICD-10-CM

## 2021-03-06 MED ORDER — METHOCARBAMOL 500 MG PO TABS: 500.0000 mg | ORAL_TABLET | Freq: Three times a day (TID) | ORAL | 1 refills | Status: DC

## 2021-03-06 MED ORDER — DICLOFENAC SODIUM 75 MG PO TBEC: 75.0000 mg | DELAYED_RELEASE_TABLET | Freq: Two times a day (BID) | ORAL | 0 refills | Status: DC

## 2021-03-06 NOTE — Progress Notes (Signed)
HPI: Gabriela Le returns 1 week status post left shoulder arthroscopy with extensive debridement and subacromial decompression.  She is having a lot of pain particularly at night.  She is wearing a sling.  She is taking hydrocodone but this is not helping.  No fevers chills.  Physical exam: Left shoulder port sites are all well approximated interrupted nylon sutures no signs of infection.  She has significant pain with any range of motion of the left shoulder.  Able to bring her to approximately 120 degrees of forward flexion with the left arm.  Impression: Status post left shoulder arthroscopy 02/27/2021  Plan: We will send her to physical therapy for range of motion strengthening home exercise program for the left shoulder.  Discontinue the sling.  Placed her on diclofenac for the next 2 weeks no other NSAIDs.  Arthroscopy images were reviewed with the patient and her husband is present.  Interpreter was used today.

## 2021-03-07 NOTE — Addendum Note (Signed)
Addended by: Barbette Or on: 03/07/2021 08:03 AM   Modules accepted: Orders

## 2021-03-13 ENCOUNTER — Ambulatory Visit: Payer: 59 | Attending: Physician Assistant | Admitting: Physical Therapy

## 2021-03-13 ENCOUNTER — Encounter: Payer: Self-pay | Admitting: Physical Therapy

## 2021-03-13 ENCOUNTER — Other Ambulatory Visit: Payer: Self-pay

## 2021-03-13 DIAGNOSIS — G8929 Other chronic pain: Secondary | ICD-10-CM | POA: Diagnosis present

## 2021-03-13 DIAGNOSIS — M25612 Stiffness of left shoulder, not elsewhere classified: Secondary | ICD-10-CM | POA: Insufficient documentation

## 2021-03-13 DIAGNOSIS — M25512 Pain in left shoulder: Secondary | ICD-10-CM | POA: Diagnosis not present

## 2021-03-13 DIAGNOSIS — M6281 Muscle weakness (generalized): Secondary | ICD-10-CM | POA: Diagnosis present

## 2021-03-13 DIAGNOSIS — R293 Abnormal posture: Secondary | ICD-10-CM | POA: Insufficient documentation

## 2021-03-13 NOTE — Therapy (Signed)
[facility] [address]  [address] [place], [place], [address] Phone: [phone]   Fax:  [phone]  Physical Therapy Evaluation  Patient Details  Name: Gabriela Le MRN: [id number] Date of Birth: 1976/11/08 Referring Provider (PT): Richardean Canal, PA-C   Encounter Date: 03/13/2021   PT End of Session - 03/13/21 0932    Visit Number 1    Number of Visits 5    Date for PT Re-Evaluation 03/27/21    Authorization Type [facility] Medicaid    PT Start Time [phone]    PT Stop Time 0926    PT Time Calculation (min) 39 min    Activity Tolerance Patient limited by pain    Behavior During Therapy [facility] for tasks assessed/performed           Past Medical History:  Diagnosis Date  . GERD (gastroesophageal reflux disease)   . H/O cold sores    ACYCLOVIR OINTMENT PRN  . Infertility associated with anovulation    HAS TAKEN CLOMID IN THE PAST  . Irregular menses   . Missed ab 12/29/2010   no surgery required  . 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 APPENDECTOMY     6 YOA  . CESAREAN SECTION  01/06/2013   Procedure: CESAREAN SECTION;  Surgeon: Hal Morales, MD;  Location: [facility];  Service: Obstetrics;  Laterality: N/A;  Primary  . CESAREAN SECTION N/A 10/09/2014   Procedure: CESAREAN SECTION;  Surgeon: Konrad Felix, MD;  Location: [facility];  Service: Obstetrics;  Laterality: N/A;  . CESAREAN SECTION N/A 08/09/2017   Procedure: REPEAT CESAREAN SECTION;  Surgeon: Willodean Rosenthal, MD;  Location: [facility];  Service: Obstetrics;  Laterality: N/A;  . CHOLECYSTECTOMY N/A 11/27/2014   Procedure: LAPAROSCOPIC CHOLECYSTECTOMY WITH INTRAOPERATIVE CHOLANGIOGRAM;  Surgeon: Chevis Pretty III, MD;  Location: [facility];  Service: General;  Laterality: N/A;  . CYSTECTOMY    . SHOULDER ARTHROSCOPY WITH SUBACROMIAL DECOMPRESSION Left 02/27/2021   Procedure: LEFT SHOULDER ARTHROSCOPY  WITH DEBRIDEMENT AND SUBACROMIAL DECOMPRESSION;  Surgeon: [name], MD;  Location: [facility];  Service: Orthopedics;  Laterality: Left;    There were no vitals filed for this visit.    Subjective Assessment - 03/13/21 0849    Subjective Patient reports undergoing L shoulder arthroscopy on 02/27/21. Wore a sling for 2 days and then started moving a little bit. Has started doing some exercises at home but still has trouble reaching up and dressing. Pain occurs over the L anterior shoulder without radiation or N/T. Worse with reaching overhead and reaching behind. Better with meds. Has not tried icing. Plan to travel next Friday (the 24th) as she will be moving to [place] for a family emergency until August. Plans to start PT once there.    Pertinent History GERD    Limitations Lifting;House hold activities    Diagnostic tests none recentd    Patient Stated Goals decrease pain    Currently in Pain? Yes    Pain Score 5     Pain Location Shoulder    Pain Orientation Left;Anterior    Pain Descriptors / Indicators Heaviness    Pain Type Acute pain              [facility] PT Assessment - 03/13/21 0853      Assessment   Medical Diagnosis s/p arthroscopy of L shoulder    Referring Provider (  PT) Richardean CanalGilbert Clark, PA-C    Onset Date/Surgical Date 02/27/21    Hand Dominance Left    Next MD Visit not scheduled    Prior Therapy yes- for shoulder      Precautions   Precautions None      Balance Screen   Has the patient fallen in the past 6 months No    Has the patient had a decrease in activity level because of a fear of falling?  No    Is the patient reluctant to leave their home because of a fear of falling?  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    Leisure play with her kids      Cognition   Overall Cognitive Status Within Functional Limits for tasks assessed      Observation/Other Assessments   Observations well-healing L shoulder incisions withotu redness or swelling      Sensation   Light Touch Appears Intact      Coordination   Gross Motor Movements are Fluid and Coordinated Yes      Posture/Postural Control   Posture/Postural Control Postural limitations    Postural Limitations Rounded Shoulders    Posture Comments B shoulders elevated      ROM / Strength   AROM / PROM / Strength AROM;PROM;Strength      AROM   Overall AROM Comments B elbow/forearm AROM WNL and nonpainful    AROM Assessment Site Shoulder    Right/Left Shoulder Right;Left    Right Shoulder Flexion 163 Degrees    Right Shoulder ABduction 180 Degrees    Right Shoulder Internal Rotation --   FIT T6   Right Shoulder External Rotation --   FER T4   Left Shoulder Flexion 63 Degrees   pain   Left Shoulder ABduction 25 Degrees   pain   Left Shoulder Internal Rotation --   unable   Left Shoulder External Rotation --   unable     PROM   PROM Assessment Site Shoulder    Right/Left Shoulder Left    Left Shoulder Flexion 153 Degrees    Left Shoulder ABduction 165 Degrees    Left Shoulder Internal Rotation 85 Degrees    Left Shoulder External Rotation 21 Degrees   pain     Strength   Strength Assessment Site Shoulder    Right/Left Shoulder Right;Left    Right Shoulder Flexion 4+/5    Right Shoulder ABduction 4+/5    Right Shoulder Internal Rotation 4/5    Right Shoulder External Rotation 4/5    Left Shoulder Flexion 2/5    Left Shoulder ABduction 2/5    Left Shoulder Internal Rotation 3+/5   measured with shoulder in neutral   Left Shoulder External Rotation 3+/5   measured with shoulder in neutral     Palpation   Palpation comment considerable soft tissue resreiction in L  UT, LS, posterior deltoid and infraspinatus; TTP over L proximal biceps tendon                      Objective measurements completed on examination: See above findings.  PT Education - 03/13/21 0931    Education Details prognosis, POC, HEP    Person(s) Educated Patient    Methods Explanation;Demonstration;Tactile cues;Verbal cues;Handout    Comprehension Verbalized understanding;Returned demonstration            PT Short Term Goals - 03/13/21 0938      PT SHORT TERM GOAL #1   Title Patient will be independent with initial HEP    Time 1    Period Weeks    Status New    Target Date 03/20/21             PT Long Term Goals - 03/13/21 0939      PT LONG TERM GOAL #1   Title Patient will be independent with ongoing/advanced HEP    Time 2    Period Weeks    Status New    Target Date 03/27/21      PT LONG TERM GOAL #2   Title Patient to improve L shoulder AROM to [facility] without pain provocation    Time 2    Period Weeks    Status New    Target Date 03/27/21      PT LONG TERM GOAL #3   Title Patient will demonstrate improved L shoulder strength to >/= 4-/5 for improved functional UE use    Time 2    Period Weeks    Status New    Target Date 03/27/21      PT LONG TERM GOAL #4   Title Patient to report 50% improved tolerance for dressing.    Time 2    Period Weeks    Status New    Target Date 03/27/21                  Plan - 03/13/21 0932    Clinical Impression Statement Patient is a 45 y/o F presenting to OPPT with c/o L shoulder pain s/p L shoulder arthroscopy with extensive debridement and subacromial decompression on 02/27/21. Patient is now out of sling and notes that she has tried to start some exercises on her own. Pain is located over the L anterior shoulder without N/T or radiation. Worse with reaching overhead and behind her and notes difficult with dressing as a result. Patient today presenting with B rounded and  elevated shoulders, very limited and painful L shoulder AROM, limited and painful L shoulder PROM, L shoulder weakness, considerable soft tissue restriction in L UT, LS, posterior deltoid and infraspinatus, and TTP over L proximal biceps tendon. Patient was educated on gentle AAROM HEP- patient reported understanding. Would benefit from skilled PT services 2x/week for 2 weeks to address aforementioned impairments until patient's move to [place].    Personal Factors and Comorbidities Age;Comorbidity 1;Past/Current Experience;Profession;Time since onset of injury/illness/exacerbation    Comorbidities GERD    Examination-Activity Limitations Bathing;Sleep;Bed Mobility;Caring for Others;Carry;Dressing;Transfers;Hygiene/Grooming;Lift;Reach Overhead;Self Feeding    Examination-Participation Restrictions Cleaning;Shop;Community Activity;Driving;Yard Work;Laundry;Meal Prep;Occupation    Stability/Clinical Decision Making Stable/Uncomplicated    Clinical Decision Making Low    Rehab Potential Good    PT Frequency 2x / week    PT Duration 2 weeks    PT Treatment/Interventions ADLs/Self Care Home Management;Cryotherapy;Electrical Stimulation;Iontophoresis 4mg /ml Dexamethasone;Moist Heat;Therapeutic exercise;Therapeutic activities;Functional mobility training;Stair training;Gait training;Ultrasound;Neuromuscular re-education;Patient/family education;Manual techniques;Vasopneumatic Device;Taping;Energy conservation;Dry needling;Passive range of motion;Scar mobilization    PT Next Visit Plan reassess HEP; progress L shoulder PROM/AAROM/AROM; strengthening to tolerance    Consulted and Agree with Plan of Care Patient  Patient will benefit from skilled therapeutic intervention in order to improve the following deficits and impairments:  Increased edema,Decreased scar mobility,Decreased activity tolerance,Decreased strength,Increased fascial restricitons,Impaired UE functional use,Pain,Increased  muscle spasms,Improper body mechanics,Decreased range of motion,Postural dysfunction,Impaired flexibility  Visit Diagnosis: Acute pain of left shoulder  Stiffness of left shoulder, not elsewhere classified  Muscle weakness (generalized)  Abnormal posture     Problem List Patient Active Problem List   Diagnosis Date Noted  . Impingement syndrome of left shoulder 02/27/2021  . Epidermoid cyst 11/04/2020  . Candidal vulvovaginitis 11/04/2020  . Late prenatal care in third trimester 06/23/2017  . Supervision of high risk pregnancy, antepartum 02/15/2017  . Advanced maternal age in multigravida, unspecified trimester 02/15/2017  . Non-English speaking patient 02/15/2017  . Previous cesarean delivery, antepartum condition or complication 10/06/2014  . 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

## 2021-03-14 ENCOUNTER — Ambulatory Visit: Payer: 59

## 2021-03-14 DIAGNOSIS — M25612 Stiffness of left shoulder, not elsewhere classified: Secondary | ICD-10-CM

## 2021-03-14 DIAGNOSIS — G8929 Other chronic pain: Secondary | ICD-10-CM

## 2021-03-14 DIAGNOSIS — M6281 Muscle weakness (generalized): Secondary | ICD-10-CM

## 2021-03-14 DIAGNOSIS — R293 Abnormal posture: Secondary | ICD-10-CM

## 2021-03-14 DIAGNOSIS — M25512 Pain in left shoulder: Secondary | ICD-10-CM

## 2021-03-14 NOTE — Therapy (Signed)
[facility] [address]  [address] [place], [place], [address] Phone: [phone]   Fax:  [phone]  Physical Therapy Treatment  Patient Details  Name: Gabriela Le MRN: [id number] Date of Birth: 04-20-1976 Referring Provider (PT): Richardean Canal, PA-C   Encounter Date: 03/14/2021   PT End of Session - 03/14/21 1121    Visit Number 2    Number of Visits 5    Date for PT Re-Evaluation 03/27/21    Authorization Type [facility] Medicaid    PT Start Time 1017    PT Stop Time 1102    PT Time Calculation (min) 45 min    Activity Tolerance Patient limited by pain;Patient tolerated treatment well    Behavior During Therapy [facility] for tasks assessed/performed           Past Medical History:  Diagnosis Date  . GERD (gastroesophageal reflux disease)   . H/O cold sores    ACYCLOVIR OINTMENT PRN  . Infertility associated with anovulation    HAS TAKEN CLOMID IN THE PAST  . Irregular menses   . Missed ab 12/29/2010   no surgery required  . 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 APPENDECTOMY     6 YOA  . CESAREAN SECTION  01/06/2013   Procedure: CESAREAN SECTION;  Surgeon: Hal Morales, MD;  Location: [facility];  Service: Obstetrics;  Laterality: N/A;  Primary  . CESAREAN SECTION N/A 10/09/2014   Procedure: CESAREAN SECTION;  Surgeon: Konrad Felix, MD;  Location: [facility];  Service: Obstetrics;  Laterality: N/A;  . CESAREAN SECTION N/A 08/09/2017   Procedure: REPEAT CESAREAN SECTION;  Surgeon: Willodean Rosenthal, MD;  Location: [facility];  Service: Obstetrics;  Laterality: N/A;  . CHOLECYSTECTOMY N/A 11/27/2014   Procedure: LAPAROSCOPIC CHOLECYSTECTOMY WITH INTRAOPERATIVE CHOLANGIOGRAM;  Surgeon: Chevis Pretty III, MD;  Location: [facility];  Service: General;  Laterality: N/A;  . CYSTECTOMY    . SHOULDER ARTHROSCOPY WITH SUBACROMIAL DECOMPRESSION Left 02/27/2021    Procedure: LEFT SHOULDER ARTHROSCOPY WITH DEBRIDEMENT AND SUBACROMIAL DECOMPRESSION;  Surgeon: [name], MD;  Location: [facility];  Service: Orthopedics;  Laterality: Left;    There were no vitals filed for this visit.   Subjective Assessment - 03/14/21 1020    Subjective Pt reports she is having stiffness in her L shoulder today.    Pertinent History GERD    Diagnostic tests none recentd    Patient Stated Goals decrease pain    Currently in Pain? No/denies                             [facility] Adult PT Treatment/Exercise - 03/14/21 0001      Exercises   Exercises Shoulder      Shoulder Exercises: Supine   External Rotation AAROM;Left;10 reps;Limitations    External Rotation Limitations cane    Flexion AAROM;Both;10 reps;Limitations    Flexion Limitations cane    ABduction AAROM;Left;10 reps;Limitations    ABduction Limitations cane; pt needed a lot of guidance; pain reported but tolerable      Shoulder Exercises: Standing   External Rotation Strengthening;Left;10 reps;Theraband    Theraband Level (Shoulder External Rotation) Level 2 (Red)    Extension Strengthening;Both;20 reps;Theraband    Theraband Level (Shoulder Extension) Level 2 (Red)    Row Strengthening;Both;20 reps;Theraband    Theraband Level (Shoulder Row) Level  2 (Red)    Row Limitations cues to prevent substitution with biceps      Shoulder Exercises: Therapy Ball   Flexion Both;15 reps    Scaption Left;15 reps    Scaption Limitations extensive cueing for technique      Manual Therapy   Manual Therapy Soft tissue mobilization;Myofascial release    Soft tissue mobilization STM to L UT and levator    Myofascial Release TPR to L UT and levator                    PT Short Term Goals - 03/14/21 1129      PT SHORT TERM GOAL #1   Title Patient will be independent with initial HEP    Time 1    Period Weeks    Status On-going    Target Date 03/20/21              PT Long Term Goals - 03/14/21 1129      PT LONG TERM GOAL #1   Title Patient will be independent with ongoing/advanced HEP    Time 2    Period Weeks    Status On-going      PT LONG TERM GOAL #2   Title Patient to improve L shoulder AROM to [facility] without pain provocation    Time 2    Period Weeks    Status On-going      PT LONG TERM GOAL #3   Title Patient will demonstrate improved L shoulder strength to >/= 4-/5 for improved functional UE use    Time 2    Period Weeks    Status On-going      PT LONG TERM GOAL #4   Title Patient to report 50% improved tolerance for dressing.    Time 2    Period Weeks    Status On-going      PT LONG TERM GOAL #5   Status On-going                 Plan - 03/14/21 1122    Clinical Impression Statement Pt had a good response to treatment. Reviewed her intial HEP to ensure understanding and proper performance of exercises. She needed much cueing to properly execute the exercises and to avoid pushing to the point of pain. Tried shld flexion in sitting today, she was unable to complete w/o her shld "catching" when lowering it and w/o pain. Cont'd with scap stabilization to help improve GH joint and shoulder girdle mechanics. Also did STM, instructed her on self massage with tennis ball, use of heat to help increase tissue extensibility of her L UT and levator, she had a lot of tightness in those areas and had good response post STM.    Personal Factors and Comorbidities Age;Comorbidity 1;Past/Current Experience;Profession;Time since onset of injury/illness/exacerbation    Comorbidities GERD    PT Frequency 2x / week    PT Duration 2 weeks    PT Treatment/Interventions ADLs/Self Care Home Management;Cryotherapy;Electrical Stimulation;Iontophoresis 4mg /ml Dexamethasone;Moist Heat;Therapeutic exercise;Therapeutic activities;Functional mobility training;Stair training;Gait training;Ultrasound;Neuromuscular re-education;Patient/family  education;Manual techniques;Vasopneumatic Device;Taping;Energy conservation;Dry needling;Passive range of motion;Scar mobilization    PT Next Visit Plan update HEP; progress L shoulder PROM/AAROM/AROM; strengthening to tolerance    Consulted and Agree with Plan of Care Patient           Patient will benefit from skilled therapeutic intervention in order to improve the following deficits and impairments:  Increased edema,Decreased scar mobility,Decreased activity tolerance,Decreased strength,Increased fascial restricitons,Impaired UE functional  use,Pain,Increased muscle spasms,Improper body mechanics,Decreased range of motion,Postural dysfunction,Impaired flexibility  Visit Diagnosis: Acute pain of left shoulder  Stiffness of left shoulder, not elsewhere classified  Muscle weakness (generalized)  Abnormal posture  Chronic left shoulder pain     Problem List Patient Active Problem List   Diagnosis Date Noted  . Impingement syndrome of left shoulder 02/27/2021  . Epidermoid cyst 11/04/2020  . Candidal vulvovaginitis 11/04/2020  . Late prenatal care in third trimester 06/23/2017  . Supervision of high risk pregnancy, antepartum 02/15/2017  . Advanced maternal age in multigravida, unspecified trimester 02/15/2017  . Non-English speaking patient 02/15/2017  . Previous cesarean delivery, antepartum condition or complication 10/06/2014  . 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  [address] [place], [place], [address] Phone: [phone]   Fax:  [phone]  Name: Gabriela Le MRN: [id number] Date of Birth: 01-17-1976

## 2021-03-17 ENCOUNTER — Other Ambulatory Visit: Payer: Self-pay | Admitting: Physician Assistant

## 2021-03-18 ENCOUNTER — Ambulatory Visit: Payer: 59

## 2021-03-19 ENCOUNTER — Ambulatory Visit: Payer: 59 | Admitting: Physical Therapy

## 2021-03-19 ENCOUNTER — Encounter: Payer: Self-pay | Admitting: Physical Therapy

## 2021-03-19 ENCOUNTER — Other Ambulatory Visit: Payer: Self-pay

## 2021-03-19 DIAGNOSIS — M25612 Stiffness of left shoulder, not elsewhere classified: Secondary | ICD-10-CM

## 2021-03-19 DIAGNOSIS — M6281 Muscle weakness (generalized): Secondary | ICD-10-CM

## 2021-03-19 DIAGNOSIS — R293 Abnormal posture: Secondary | ICD-10-CM

## 2021-03-19 DIAGNOSIS — M25512 Pain in left shoulder: Secondary | ICD-10-CM

## 2021-03-19 NOTE — Therapy (Signed)
[facility] [address]  [place], [place], [address] Phone: [phone]   Fax:  [phone]  Physical Therapy Discharge Summary  Patient Details  Name: Gabriela Le MRN: [id number] Date of Birth: 1976-05-23 Referring Provider (PT): Erskine Emery, PA-C   Encounter Date: 03/19/2021   PT End of Session - 03/19/21 1529    Visit Number 3    Number of Visits 5    Date for PT Re-Evaluation 03/27/21    Authorization Type [employer] Medicaid    PT Start Time 4037    PT Stop Time 1526    PT Time Calculation (min) 39 min    Activity Tolerance Patient tolerated treatment well;Patient limited by pain    Behavior During Therapy [facility] for tasks assessed/performed           Past Medical History:  Diagnosis Date  . GERD (gastroesophageal reflux disease)   . H/O cold sores    ACYCLOVIR OINTMENT PRN  . Infertility associated with anovulation    HAS TAKEN CLOMID IN THE PAST  . Irregular menses   . Missed ab 12/29/2010   no surgery required  . 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 APPENDECTOMY     6 YOA  . CESAREAN SECTION  01/06/2013   Procedure: CESAREAN SECTION;  Surgeon: Eldred Manges, MD;  Location: [facility];  Service: Obstetrics;  Laterality: N/A;  Primary  . CESAREAN SECTION N/A 10/09/2014   Procedure: CESAREAN SECTION;  Surgeon: Alinda Dooms, MD;  Location: [facility];  Service: Obstetrics;  Laterality: N/A;  . CESAREAN SECTION N/A 08/09/2017   Procedure: REPEAT CESAREAN SECTION;  Surgeon: Lavonia Drafts, MD;  Location: [place];  Service: Obstetrics;  Laterality: N/A;  . CHOLECYSTECTOMY N/A 11/27/2014   Procedure: LAPAROSCOPIC CHOLECYSTECTOMY WITH INTRAOPERATIVE CHOLANGIOGRAM;  Surgeon: Autumn Messing III, MD;  Location: [facility];  Service: General;  Laterality: N/A;  . CYSTECTOMY    . SHOULDER ARTHROSCOPY WITH SUBACROMIAL DECOMPRESSION Left 02/27/2021    Procedure: LEFT SHOULDER ARTHROSCOPY WITH DEBRIDEMENT AND SUBACROMIAL DECOMPRESSION;  Surgeon: Mcarthur Rossetti, MD;  Location: [facility];  Service: Orthopedics;  Laterality: Left;    There were no vitals filed for this visit.   Subjective Assessment - 03/19/21 1449    Subjective Reports that she feels comfortable with her HEP- notes no difficulty with flexion, more limitation with abduction.    Pertinent History GERD    Diagnostic tests none recentd    Patient Stated Goals decrease pain    Currently in Pain? No/denies              [facility] PT Assessment - 03/19/21 0001      AROM   Left Shoulder Flexion 110 Degrees   "heavy"   Left Shoulder ABduction 49 Degrees    Left Shoulder Internal Rotation --   unable   Left Shoulder External Rotation --   unable     Strength   Left Shoulder Flexion 2/5    Left Shoulder ABduction 2/5    Left Shoulder Internal Rotation 3+/5    Left Shoulder External Rotation 3+/5                         [facility] Adult PT Treatment/Exercise - 03/19/21 0001      Shoulder Exercises: Seated   External Rotation AAROM;Left;10 reps    External Rotation Limitations  with wand   cues to maintain shoulders in neutral   Flexion AAROM;Left;10 reps    Flexion Limitations with wand   c/o fatigue   Abduction AAROM;Left;10 reps    ABduction Limitations with wand   c/o "heaviness"   Other Seated Exercises L shoulder abduction AAROM rollouts with orange pball 10x to tolerance; L shoulder ER forward leans on bolster 10x   good tolerance     Shoulder Exercises: Sidelying   ABduction AROM;Left;5 reps    ABduction Limitations thumb up; to tolerance   pt hesitant but able to perform without PT assistance     Shoulder Exercises: Standing   External Rotation Strengthening;Left;10 reps;Theraband    Theraband Level (Shoulder External Rotation) Level 1 (Yellow)    External Rotation Weight (lbs) isometric shoulder stepouts   difficulty  maintaining neutral shoulder   Internal Rotation Strengthening;Left;10 reps;Theraband    Theraband Level (Shoulder Internal Rotation) Level 1 (Yellow)    Internal Rotation Limitations isometric shoulder stepouts      Shoulder Exercises: Pulleys   Flexion 3 minutes    Flexion Limitations to tolerance    Scaption 3 minutes    Scaption Limitations to tolerance                  PT Education - 03/19/21 1529    Education Details discussion on objective progress and remaining impairments; update/consolidation to [employer]) Educated Patient    Methods Explanation;Demonstration;Tactile cues;Verbal cues;Handout    Comprehension Verbalized understanding;Returned demonstration            PT Short Term Goals - 03/19/21 1452      PT SHORT TERM GOAL #1   Title Patient will be independent with initial HEP    Time 1    Period Weeks    Status Achieved    Target Date 03/20/21             PT Long Term Goals - 03/19/21 1452      PT LONG TERM GOAL #1   Title Patient will be independent with ongoing/advanced HEP    Time 2    Period Weeks    Status Achieved      PT LONG TERM GOAL #2   Title Patient to improve L shoulder AROM to [facility] without pain provocation    Time 2    Period Weeks    Status Not Met   improved, still limited     PT LONG TERM GOAL #3   Title Patient will demonstrate improved L shoulder strength to >/= 4-/5 for improved functional UE use    Time 2    Period Weeks    Status Not Met      PT LONG TERM GOAL #4   Title Patient to report 50% improved tolerance for dressing.    Time 2    Period Weeks    Status Not Met   reports still having difficulty     PT LONG TERM GOAL #5   Status On-going                 Plan - 03/19/21 1530    Clinical Impression Statement Patient arrived to session for last appointment before moving to [place]. Reports understanding of HEP and denies questions. Reports no improvement in ability to dress at this  time. L shoulder AROM has improved in flexion and abduction- patient seems to be most limited by fear of movement, as she reports no pain but does note "heaviness."  Strength testing unchanged. Worked on sitting shoulder AAROM with patient demonstrating B shoulder hike with overhead elevation. Good ROM demonstrated with IR/ER. Also initiated sidelying abduction with patient again quite hesitant but ultimately able to perform without assistance. Updated HEP with exercises that were well-tolerated today. Patient reported understanding and without complaints at end of session. Patient is being [facility] per her request.    Personal Factors and Comorbidities Age;Comorbidity 1;Past/Current Experience;Profession;Time since onset of injury/illness/exacerbation    Comorbidities GERD    PT Frequency 2x / week    PT Duration 2 weeks    PT Treatment/Interventions ADLs/Self Care Home Management;Cryotherapy;Electrical Stimulation;Iontophoresis 46m/ml Dexamethasone;Moist Heat;Therapeutic exercise;Therapeutic activities;Functional mobility training;Stair training;Gait training;Ultrasound;Neuromuscular re-education;Patient/family education;Manual techniques;Vasopneumatic Device;Taping;Energy conservation;Dry needling;Passive range of motion;Scar mobilization    PT Next Visit Plan DC at this time    Consulted and Agree with Plan of Care Patient           Patient will benefit from skilled therapeutic intervention in order to improve the following deficits and impairments:  Increased edema,Decreased scar mobility,Decreased activity tolerance,Decreased strength,Increased fascial restricitons,Impaired UE functional use,Pain,Increased muscle spasms,Improper body mechanics,Decreased range of motion,Postural dysfunction,Impaired flexibility  Visit Diagnosis: Acute pain of left shoulder  Stiffness of left shoulder, not elsewhere classified  Muscle weakness (generalized)  Abnormal posture     Problem List Patient Active  Problem List   Diagnosis Date Noted  . Impingement syndrome of left shoulder 02/27/2021  . Epidermoid cyst 11/04/2020  . Candidal vulvovaginitis 11/04/2020  . Late prenatal care in third trimester 06/23/2017  . Supervision of high risk pregnancy, antepartum 02/15/2017  . Advanced maternal age in multigravida, unspecified trimester 02/15/2017  . Non-English speaking patient 02/15/2017  . Previous cesarean delivery, antepartum condition or complication 120/25/4270 . 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  Patient goals were not met. Patient is being discharged due to the patient's request.  ?????     YJanene Harvey PT, DPT 03/19/21 3:33 PM    [facility] [place] [address]Phone: [phone]  Fax:  [phone] Name: MADRINE HAYWORTHMRN: [id number] of Birth: [birth year]

## 2021-03-28 ENCOUNTER — Other Ambulatory Visit: Payer: Self-pay | Admitting: Physician Assistant

## 2021-04-08 ENCOUNTER — Other Ambulatory Visit: Payer: Self-pay | Admitting: Physician Assistant

## 2021-04-19 ENCOUNTER — Other Ambulatory Visit: Payer: Self-pay | Admitting: Physician Assistant

## 2021-09-16 ENCOUNTER — Ambulatory Visit (INDEPENDENT_AMBULATORY_CARE_PROVIDER_SITE_OTHER): Payer: 59 | Admitting: Orthopaedic Surgery

## 2021-09-16 ENCOUNTER — Other Ambulatory Visit: Payer: Self-pay

## 2021-09-16 ENCOUNTER — Encounter: Payer: Self-pay | Admitting: Orthopaedic Surgery

## 2021-09-16 DIAGNOSIS — M25512 Pain in left shoulder: Secondary | ICD-10-CM

## 2021-09-16 DIAGNOSIS — G8929 Other chronic pain: Secondary | ICD-10-CM

## 2021-09-16 DIAGNOSIS — Z96612 Presence of left artificial shoulder joint: Secondary | ICD-10-CM

## 2021-09-16 MED ORDER — NABUMETONE 500 MG PO TABS: 500.0000 mg | ORAL_TABLET | Freq: Two times a day (BID) | ORAL | 1 refills | Status: DC | PRN

## 2021-09-16 MED ORDER — METHYLPREDNISOLONE 4 MG PO TABS: ORAL_TABLET | ORAL | 0 refills | Status: DC

## 2021-09-16 NOTE — Progress Notes (Signed)
The patient comes in today for continued follow-up as it relates to her left shoulder.  In March of this year we performed an arthroscopic debridement and subacromial decompression for her left shoulder.  She had significant calcific tendinitis.  She has now participated in physical therapy for her right shoulder.  Recently she noticed some sharp pains with movement motion with reaching overhead and behind her.  She felt something happened in her shoulder when someone pulled against that and she had a ripping sensation and feels like something is torn.  She is non-English-speaking and has an Sports coach interpreter with her.  She denies any falls.  She is not a diabetic.  She denies any neck pain.  She denies any numbness and tingling going down her left arm.  On exam she has fluid range of motion of the left shoulder but it is painful in the subacromial outlet.  There is some slight weakness with external rotation and adduction of the shoulder.  Remainder of extremity exam is normal.  At this point she is deferring any type of steroid injection.  These have not helped her in the past. To try steroid taper at least and some Relafen and we will try to obtain a MRI of her left shoulder to rule out any type of tear.  We will see her back after this MRI.  All questions and concerns were answered and addressed through the interpreter.

## 2021-09-18 ENCOUNTER — Encounter: Payer: Self-pay | Admitting: Family

## 2021-09-19 NOTE — Telephone Encounter (Signed)
I have called pt and spoke to her husband. She is now scheduled to see Vernona Rieger on 09/23/21 @ 10:40. New address given to husband with Fasting instructions.

## 2021-09-23 ENCOUNTER — Other Ambulatory Visit: Payer: Self-pay

## 2021-09-23 ENCOUNTER — Encounter: Payer: Self-pay | Admitting: Family

## 2021-09-23 ENCOUNTER — Ambulatory Visit (INDEPENDENT_AMBULATORY_CARE_PROVIDER_SITE_OTHER): Payer: 59 | Admitting: Family

## 2021-09-23 VITALS — BP 120/80 | HR 86 | Temp 97.7°F | Ht 60.0 in | Wt 124.8 lb

## 2021-09-23 DIAGNOSIS — N926 Irregular menstruation, unspecified: Secondary | ICD-10-CM

## 2021-09-23 DIAGNOSIS — M79672 Pain in left foot: Secondary | ICD-10-CM

## 2021-09-23 DIAGNOSIS — Z13220 Encounter for screening for lipoid disorders: Secondary | ICD-10-CM

## 2021-09-23 DIAGNOSIS — L659 Nonscarring hair loss, unspecified: Secondary | ICD-10-CM | POA: Diagnosis not present

## 2021-09-23 DIAGNOSIS — Z0000 Encounter for general adult medical examination without abnormal findings: Secondary | ICD-10-CM

## 2021-09-23 DIAGNOSIS — Z1231 Encounter for screening mammogram for malignant neoplasm of breast: Secondary | ICD-10-CM | POA: Diagnosis not present

## 2021-09-23 DIAGNOSIS — B001 Herpesviral vesicular dermatitis: Secondary | ICD-10-CM

## 2021-09-23 LAB — CBC WITH DIFFERENTIAL/PLATELET
Basophils Absolute: 0 10*3/uL (ref 0.0–0.1)
Basophils Relative: 0.2 % (ref 0.0–3.0)
Eosinophils Absolute: 0.3 10*3/uL (ref 0.0–0.7)
Eosinophils Relative: 4.5 % (ref 0.0–5.0)
HCT: 37.6 % (ref 36.0–46.0)
Hemoglobin: 12.2 g/dL (ref 12.0–15.0)
Lymphocytes Relative: 42 % (ref 12.0–46.0)
Lymphs Abs: 2.8 10*3/uL (ref 0.7–4.0)
MCHC: 32.3 g/dL (ref 30.0–36.0)
MCV: 88.7 fl (ref 78.0–100.0)
Monocytes Absolute: 0.3 10*3/uL (ref 0.1–1.0)
Monocytes Relative: 4.2 % (ref 3.0–12.0)
Neutro Abs: 3.2 10*3/uL (ref 1.4–7.7)
Neutrophils Relative %: 49.1 % (ref 43.0–77.0)
Platelets: 252 10*3/uL (ref 150.0–400.0)
RBC: 4.24 Mil/uL (ref 3.87–5.11)
RDW: 14.9 % (ref 11.5–15.5)
WBC: 6.6 10*3/uL (ref 4.0–10.5)

## 2021-09-23 LAB — COMPREHENSIVE METABOLIC PANEL
ALT: 10 U/L (ref 0–35)
AST: 13 U/L (ref 0–37)
Albumin: 4 g/dL (ref 3.5–5.2)
Alkaline Phosphatase: 61 U/L (ref 39–117)
BUN: 14 mg/dL (ref 6–23)
CO2: 31 mEq/L (ref 19–32)
Calcium: 9.1 mg/dL (ref 8.4–10.5)
Chloride: 103 mEq/L (ref 96–112)
Creatinine, Ser: 0.54 mg/dL (ref 0.40–1.20)
GFR: 110.94 mL/min (ref >60.00)
Glucose, Bld: 88 mg/dL (ref 70–99)
Potassium: 3.9 mEq/L (ref 3.5–5.1)
Sodium: 141 mEq/L (ref 135–145)
Total Bilirubin: 0.4 mg/dL (ref 0.2–1.2)
Total Protein: 6.8 g/dL (ref 6.0–8.3)

## 2021-09-23 LAB — LIPID PANEL
Cholesterol: 198 mg/dL (ref 0–200)
HDL: 44 mg/dL (ref >39.00)
NonHDL: 153.99
Total CHOL/HDL Ratio: 4
Triglycerides: 224 mg/dL — ABNORMAL HIGH (ref 0.0–149.0)
VLDL: 44.8 mg/dL — ABNORMAL HIGH (ref 0.0–40.0)

## 2021-09-23 LAB — TSH: TSH: 1.53 u[IU]/mL (ref 0.35–5.50)

## 2021-09-23 LAB — SEDIMENTATION RATE: Sed Rate: 7 mm/hr (ref 0–20)

## 2021-09-23 LAB — URIC ACID: Uric Acid, Serum: 4.3 mg/dL (ref 2.4–7.0)

## 2021-09-23 LAB — LDL CHOLESTEROL, DIRECT: Direct LDL: 128 mg/dL

## 2021-09-23 MED ORDER — CETIRIZINE HCL 10 MG PO TABS
10.0000 mg | ORAL_TABLET | Freq: Every day | ORAL | 3 refills | Status: DC
Start: 2021-09-23 — End: 2022-02-11

## 2021-09-23 MED ORDER — VALACYCLOVIR HCL 500 MG PO TABS: 500.0000 mg | ORAL_TABLET | Freq: Every day | ORAL | 3 refills | Status: DC

## 2021-09-23 MED ORDER — ACYCLOVIR 5 % EX OINT: 1.0000 "application " | TOPICAL_OINTMENT | Freq: Two times a day (BID) | CUTANEOUS | 1 refills | Status: DC

## 2021-09-23 NOTE — Progress Notes (Signed)
Gabriela Le is a 45 y.o. 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 90 tablet 3   acyclovir ointment (ZOVIRAX) 5 % Apply 1 application topically 2 (two) times daily. 5 g 1   valACYclovir (VALTREX) 500 MG tablet Take 1 tablet (500 mg total) by mouth daily. 90 tablet 3   No current facility-administered medications for this visit.    Allergies: Patient has no known allergies.  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    Past Surgical History:  Procedure Laterality Date   APPENDECTOMY     6 YOA   CESAREAN SECTION  01/06/2013   Procedure: CESAREAN SECTION;  Surgeon: Eldred Manges, MD;  Location: [facility];  Service: Obstetrics;  Laterality: N/A;  Primary   CESAREAN SECTION N/A 10/09/2014   Procedure: CESAREAN SECTION;  Surgeon: Alinda Dooms, MD;  Location: [facility];  Service: Obstetrics;  Laterality: N/A;   CESAREAN SECTION N/A 08/09/2017   Procedure: REPEAT CESAREAN SECTION;  Surgeon: Lavonia Drafts, MD;  Location: [facility];   Service: Obstetrics;  Laterality: N/A;   CHOLECYSTECTOMY N/A 11/27/2014   Procedure: LAPAROSCOPIC CHOLECYSTECTOMY WITH INTRAOPERATIVE CHOLANGIOGRAM;  Surgeon: Autumn Messing III, MD;  Location: [facility];  Service: General;  Laterality: N/A;   CYSTECTOMY     SHOULDER ARTHROSCOPY WITH SUBACROMIAL DECOMPRESSION Left 02/27/2021   Procedure: LEFT SHOULDER ARTHROSCOPY WITH DEBRIDEMENT AND SUBACROMIAL DECOMPRESSION;  Surgeon: Mcarthur Rossetti, MD;  Location: [facility];  Service: Orthopedics;  Laterality: Left;    Family History  Problem Relation Age of Onset   Heart disease Mother        PALPITATIONS   Hypertension Mother     Social History   Tobacco Use   Smoking status: Never   Smokeless tobacco: Never  Substance Use Topics   Alcohol use: No    Subjective:   Accompanied by husband; presents for yearly CPE; overdue for mammogram/ pap smear;   LMP 9/19  Review of Systems  Constitutional: Negative.   HENT: Negative.    Eyes: Negative.   Respiratory: Negative.    Cardiovascular: Negative.   Gastrointestinal: Negative.   Genitourinary: Negative.   Musculoskeletal:  Positive for joint pain.  Skin:        Hair loss  Neurological: Negative.   Psychiatric/Behavioral: Negative.        Objective:  Vitals:   09/23/21 1049  BP: 120/80  Pulse: 86  Temp: 97.7 F (36.5 C)  TempSrc: Oral  SpO2: 99%  Weight: 124 lb 12.8 oz (56.6 kg)  Height: 5' (  1.524 m)    General: Well developed, well nourished, in no acute distress  Skin : Warm and dry.  Head: Normocephalic and atraumatic  Eyes: Sclera and conjunctiva clear; pupils round and reactive to light; extraocular movements intact  Ears: External normal; canals clear; tympanic membranes normal  Oropharynx: Pink, supple. No suspicious lesions  Neck: Supple without thyromegaly, adenopathy  Lungs: Respirations unlabored; clear to auscultation bilaterally without wheeze, rales, rhonchi  CVS exam: normal rate and regular  rhythm.  Abdomen: Soft; nontender; nondistended; normoactive bowel sounds; no masses or hepatosplenomegaly  Musculoskeletal: No deformities; no active joint inflammation  Extremities: No edema, cyanosis, clubbing  Vessels: Symmetric bilaterally  Neurologic: Alert and oriented; speech intact; face symmetrical; moves all extremities well; CNII-XII intact without focal deficit  Assessment:  1. PE (physical exam), annual   2. Alopecia   3. Screening mammogram for breast cancer   4. Irregular periods   5. Left foot pain   6. Lipid screening   7. Cold sore     Plan:  Age appropriate preventive healthcare needs addressed; encouraged regular eye doctor and dental exams; encouraged regular exercise; will update labs and refills as needed today; follow-up to be determined; Refer to dermatology; order for mammogram updated; refer back to GYN; Will have patient try taking Valtrex daily x 6 months- 1 year and then use topical cream prn; She is given information regarding Cologuard- they need to check on insurance coverage and call back if she wants to schedule. She defers flu shot today;  This visit occurred during the SARS-CoV-2 public health emergency.  Safety protocols were in place, including screening questions prior to the visit, additional usage of staff PPE, and extensive cleaning of exam room while observing appropriate contact time as indicated for disinfecting solutions.    No follow-ups on file.  Orders Placed This Encounter  Procedures   MM Digital Screening    Standing Status:   Future    Standing Expiration Date:   09/23/2022    Order Specific Question:   Reason for Exam (SYMPTOM  OR DIAGNOSIS REQUIRED)    Answer:   screening mammogram    Order Specific Question:   Is the patient pregnant?    Answer:   No    Order Specific Question:   Preferred imaging location?    Answer:   [facility]   Antinuclear Antib (ANA)   Sedimentation rate   Rheumatoid Factor   TSH   Uric acid    CBC with Differential/Platelet   Comp Met (CMET)   Lipid panel   Ambulatory referral to Dermatology    Referral Priority:   Routine    Referral Type:   Consultation    Referral Reason:   Specialty Services Required    Requested Specialty:   Dermatology    Number of Visits Requested:   1   Ambulatory referral to Gynecology    Referral Priority:   Routine    Referral Type:   Consultation    Referral Reason:   Specialty Services Required    Requested Specialty:   Gynecology    Number of Visits Requested:   1    Requested Prescriptions   Signed Prescriptions Disp Refills   valACYclovir (VALTREX) 500 MG tablet 90 tablet 3    Sig: Take 1 tablet (500 mg total) by mouth daily.   cetirizine (ZYRTEC) 10 MG tablet 90 tablet 3    Sig: Take 1 tablet (10 mg total) by mouth daily.   acyclovir ointment (ZOVIRAX)  5 % 5 g 1    Sig: Apply 1 application topically 2 (two) times daily.

## 2021-09-24 ENCOUNTER — Other Ambulatory Visit: Payer: Self-pay | Admitting: Orthopaedic Surgery

## 2021-09-24 MED ORDER — ETODOLAC 500 MG PO TABS: 500.0000 mg | ORAL_TABLET | Freq: Two times a day (BID) | ORAL | 3 refills | Status: DC | PRN

## 2021-09-24 NOTE — Telephone Encounter (Signed)
Patients insurance doesn't cover the nabumetone. Can we can it to something else?

## 2021-09-25 LAB — ANA: Anti Nuclear Antibody (ANA): NEGATIVE

## 2021-09-25 LAB — RHEUMATOID FACTOR: Rheumatoid fact SerPl-aCnc: <14 IU/mL (ref <14)

## 2021-10-07 ENCOUNTER — Other Ambulatory Visit: Payer: Self-pay

## 2021-10-07 ENCOUNTER — Ambulatory Visit (HOSPITAL_COMMUNITY)
Admission: RE | Admit: 2021-10-07 | Discharge: 2021-10-07 | Disposition: A | Discharge status: Home / Self Care | Payer: 59 | Source: Ambulatory Visit | Attending: Orthopaedic Surgery | Admitting: Orthopaedic Surgery

## 2021-10-07 ENCOUNTER — Other Ambulatory Visit: Payer: Self-pay | Admitting: Orthopaedic Surgery

## 2021-10-07 DIAGNOSIS — Z96612 Presence of left artificial shoulder joint: Secondary | ICD-10-CM | POA: Insufficient documentation

## 2021-10-07 MED ORDER — MELOXICAM 15 MG PO TABS: 15.0000 mg | ORAL_TABLET | Freq: Every day | ORAL | 1 refills | Status: DC

## 2021-10-07 NOTE — Telephone Encounter (Signed)
Do you want to try something else

## 2021-10-09 ENCOUNTER — Other Ambulatory Visit: Payer: Self-pay | Admitting: Family

## 2021-10-10 ENCOUNTER — Other Ambulatory Visit: Payer: Self-pay

## 2021-10-10 ENCOUNTER — Ambulatory Visit (INDEPENDENT_AMBULATORY_CARE_PROVIDER_SITE_OTHER): Payer: 59 | Admitting: Nurse Practitioner

## 2021-10-10 ENCOUNTER — Encounter: Payer: Self-pay | Admitting: Nurse Practitioner

## 2021-10-10 VITALS — BP 118/74 | Ht 62.0 in | Wt 126.0 lb

## 2021-10-10 DIAGNOSIS — N926 Irregular menstruation, unspecified: Secondary | ICD-10-CM

## 2021-10-10 NOTE — Progress Notes (Signed)
   Acute Office Visit  Subjective:    Patient ID: Gabriela Le, female    DOB: 1976/09/26, 45 y.o.   MRN: [id number]   HPI 45 y.o. [id number] presents as new patient to discuss menses. Referred by PCP Ria Clock, FNP. Having monthly cycles that range from 20-25 days. She has always had cycles every 3 weeks but noticed lately she has been a couple of days late some months. She has had increased stress due to mother's health who lives [place]. No changes in flow. Normal TSH 09/23/2021.    Review of Systems  Constitutional: Negative.   Genitourinary:  Positive for menstrual problem.      Objective:    Physical Exam Constitutional:      Appearance: Normal appearance.  Genitourinary:    Vagina: Normal.     Cervix: Normal.     Uterus: Normal.      Comments: Female circumcision    BP 118/74   Ht 5\' 2"  (1.575 m)   Wt 126 lb (57.2 kg)   LMP 09/13/2021   BMI 23.05 kg/m  Wt Readings from Last 3 Encounters:  10/10/21 126 lb (57.2 kg)  09/23/21 124 lb 12.8 oz (56.6 kg)  02/27/21 128 lb 1.4 oz (58.1 kg)        Assessment & Plan:   Problem List Items Addressed This Visit   None Visit Diagnoses     Menstrual changes    -  Primary      Plan: Reassured that changes in menstrual timing can occur in the 40s. No changes in flow. Denies menopausal symptoms. We discussed typical age range for menopause and what to expect during perimenopausal phase. All questions answered. Husband present during visit.      04/29/21 DNP, 11:52 AM 10/10/2021

## 2021-10-10 NOTE — Telephone Encounter (Signed)
Please advise 

## 2021-10-20 ENCOUNTER — Encounter: Payer: Self-pay | Admitting: Orthopaedic Surgery

## 2021-10-20 ENCOUNTER — Ambulatory Visit (INDEPENDENT_AMBULATORY_CARE_PROVIDER_SITE_OTHER): Payer: 59 | Admitting: Orthopaedic Surgery

## 2021-10-20 DIAGNOSIS — S46012D Strain of muscle(s) and tendon(s) of the rotator cuff of left shoulder, subsequent encounter: Secondary | ICD-10-CM | POA: Diagnosis not present

## 2021-10-20 NOTE — Progress Notes (Signed)
The patient comes in today to go over the MRI of her left shoulder.  She had an acute injury to the left shoulder when she was pulling hard on some luggage just this past month.  She felt a pop in her shoulder and had weakness with overhead activities.  She does have a previous history of a left shoulder arthroscopy with subacromial decompression.  I did review this arthroscopy pictures and the operative note and there was no tearing of the rotator cuff at that standpoint.  The MRI of her left shoulder is reviewed with her and her husband today.  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  There is some muscle atrophy as well as moderate bursitis in the subacromial outlet area.  I did show her a shoulder model and explained in detail what this new finding means.  We have recommended a left shoulder arthroscopy with rotator cuff repair.  I described what the surgery involves as well as the discussion of the risk and benefits of surgery.  Of note, she will need to a family member from outside the country come to take care of her well she is recovering from surgery.  That family member will likely need a temporary visa in order to come into the country to care for a family member that is having surgery.  We will work on getting her left surgery scheduled and be in touch about this.  All questions and concerns were answered and addressed.

## 2021-11-14 ENCOUNTER — Telehealth: Payer: Self-pay | Admitting: Family

## 2021-11-14 NOTE — Telephone Encounter (Signed)
I have called pt and her husband and they have made an appointment with Melissa for Monday.

## 2021-11-14 NOTE — Telephone Encounter (Signed)
Patient wanted appointment for sore throat and heart palpitations. Pt was triaged for further assistance.

## 2021-11-17 ENCOUNTER — Other Ambulatory Visit: Payer: Self-pay

## 2021-11-17 ENCOUNTER — Encounter: Payer: Self-pay | Admitting: Family

## 2021-11-17 ENCOUNTER — Ambulatory Visit (INDEPENDENT_AMBULATORY_CARE_PROVIDER_SITE_OTHER): Payer: 59 | Admitting: Family

## 2021-11-17 VITALS — BP 132/60 | HR 87 | Temp 98.5°F | Resp 16 | Ht 62.0 in | Wt 126.0 lb

## 2021-11-17 DIAGNOSIS — R002 Palpitations: Secondary | ICD-10-CM | POA: Insufficient documentation

## 2021-11-17 LAB — CBC WITH DIFFERENTIAL/PLATELET
Basophils Absolute: 0 10*3/uL (ref 0.0–0.1)
Basophils Relative: 0.3 % (ref 0.0–3.0)
Eosinophils Absolute: 0.1 10*3/uL (ref 0.0–0.7)
Eosinophils Relative: 0.8 % (ref 0.0–5.0)
HCT: 35.9 % — ABNORMAL LOW (ref 36.0–46.0)
Hemoglobin: 11.6 g/dL — ABNORMAL LOW (ref 12.0–15.0)
Lymphocytes Relative: 27.8 % (ref 12.0–46.0)
Lymphs Abs: 2.5 10*3/uL (ref 0.7–4.0)
MCHC: 32.4 g/dL (ref 30.0–36.0)
MCV: 89.4 fl (ref 78.0–100.0)
Monocytes Absolute: 0.5 10*3/uL (ref 0.1–1.0)
Monocytes Relative: 5.7 % (ref 3.0–12.0)
Neutro Abs: 5.9 10*3/uL (ref 1.4–7.7)
Neutrophils Relative %: 65.4 % (ref 43.0–77.0)
Platelets: 227 10*3/uL (ref 150.0–400.0)
RBC: 4.02 Mil/uL (ref 3.87–5.11)
RDW: 16.7 % — ABNORMAL HIGH (ref 11.5–15.5)
WBC: 9 10*3/uL (ref 4.0–10.5)

## 2021-11-17 LAB — COMPREHENSIVE METABOLIC PANEL
ALT: 7 U/L (ref 0–35)
AST: 14 U/L (ref 0–37)
Albumin: 3.9 g/dL (ref 3.5–5.2)
Alkaline Phosphatase: 64 U/L (ref 39–117)
BUN: 11 mg/dL (ref 6–23)
CO2: 29 mEq/L (ref 19–32)
Calcium: 8.8 mg/dL (ref 8.4–10.5)
Chloride: 105 mEq/L (ref 96–112)
Creatinine, Ser: 0.51 mg/dL (ref 0.40–1.20)
GFR: 112.36 mL/min (ref >60.00)
Glucose, Bld: 85 mg/dL (ref 70–99)
Potassium: 3.9 mEq/L (ref 3.5–5.1)
Sodium: 139 mEq/L (ref 135–145)
Total Bilirubin: 0.3 mg/dL (ref 0.2–1.2)
Total Protein: 6.5 g/dL (ref 6.0–8.3)

## 2021-11-17 LAB — TSH: TSH: 1.04 u[IU]/mL (ref 0.35–5.50)

## 2021-11-17 NOTE — Telephone Encounter (Signed)
Patient has appt scheduled with Sandford Craze, NP today, 11/17/21.   [facility] - ClientTELEPHONE ADVICE RECORDAccessNurse Patient Name:Gabriela Le Return Phone Number [phone] (Primary) Chief Complaint Heart palpitations or irregular heartbeat Reason for Call Symptomatic / Request for Health Information Initial Comment Caller states his wife is having heart palpitations. Translation No Nurse Assessment Nurse: Mila Palmer, RN, Verlon Au Date/Time Gabriela Le Time): 11/14/2021 3:33:59 PM Confirm and document reason for call. If symptomatic, describe symptoms. ---Caller states that his wife is having palpitations every day in the evening. It comes and goes. Last night it lasted for about 4 hours when it typically is much shorter than that. No CP or SOB Does the patient have any new or worsening symptoms? ---Yes Will a triage be completed? ---Yes Related visit to physician within the last 2 weeks? ---No Does the PT have any chronic conditions? (i.e. diabetes, asthma, this includes High risk factors for pregnancy, etc.) ---Yes List chronic conditions. ---High Cholesterol Is this a behavioral health or substance abuse call? ---No Guidelines Guideline Title Affirmed Question Affirmed Notes Nurse Date/Time (Eastern Time) Heart Rate and Heartbeat Questions Palpitations are a chronic symptom (recurrent or ongoing AND present > 4 weeks) Jerilee Field 11/14/2021 3:35:11 PM Disp. Time Gabriela Le Time) Disposition Final User 11/14/2021 3:43:14 PM See PCP within 2 Weeks Yes Mila Palmer, RN, Verlon Au PLEASE NOTE: All timestamps contained within this report are represented as [place] Standard Time. CONFIDENTIALTY NOTICE: This fax transmission is intended only for the addressee. It contains information that is legally privileged, confidential or otherwise protected from use or disclosure. If you are not the intended recipient, you are strictly prohibited from  reviewing, disclosing, copying using or disseminating any of this information or taking any action in reliance on or regarding this information. If you have received this fax in error, please notify us immediately by telephone so that we can arrange for its return to [place]. Phone: [phone], Toll-Free: [phone], Fax: [phone] Page: 2 of 2 Call Id: [id number] Caller Disagree/Comply Comply Caller Understands Yes PreDisposition Call Doctor Care Advice Given Per Guideline SEE PCP WITHIN 2 WEEKS: * You need to be seen for this ongoing problem within the next 2 weeks. * PCP VISIT: Call your doctor (or NP/PA) during regular office hours and make an appointment. REASSURANCE AND EDUCATION - EXTRA HEARTBEATS AND PALPITATIONS: * Everybody experiences palpitations at some point in their lives. In many circumstances it is simply a heightened awareness of the heart's normal beating. * People with anxiety or stress may describe a 'rapid heartbeat' or 'pounding' in their chest from their heart beating. * Occasional extra heartbeats are experienced by most everyone. Lack of sleep, stress, and caffeinated beverages can aggravate this condition. HEALTH BASICS: * Sleep: Try to get a sufficient amount of sleep. Lack of sleep can aggravate palpitations. Most people need 7 to 8 hours of sleep each night. * Diet: Eat a balanced healthy diet. * Liquid Intake: Drink adequate liquids, 6 to 8 glasses of water daily. CARE ADVICE given per Heart Rate and Heartbeat Questions (Adult) guideline. * You become worse CALL BACK IF: Comments User: Gabriela Mango, RN Date/Time (Eastern Time): 11/14/2021 3:44:01 PM Pt states it is a faster than normal HR around bedtime. Does not notice skipped or extra beats.

## 2021-11-17 NOTE — Patient Instructions (Signed)
Please complete lab work prior to leaving. You should be contacted about scheduling your appointment with cardiology. Please go to ER if you develop palpitations with heart rate >110, or if palpitations do not resolve in a few minutes.

## 2021-11-17 NOTE — Progress Notes (Signed)
Subjective:   By signing my name below, I, Shehryar Baig, attest that this documentation has been prepared under the direction and in the presence of Debbrah Alar NP. 11/17/2021    Patient ID: Gabriela Le, female    DOB: 03/15/1976, 45 y.o.   MRN: [id number]  Chief Complaint  Patient presents with   Palpitations    Patient complains of palpitations in the last 3 months, becoming more frequent in the last 10 days    Palpitations  Pertinent negatives include no chest pain or shortness of breath.  Patient is in today for a office visit. She is present with her husband during this visit to assist with translation.   Heart palpitations- She complains of heart palpitations for the past couple of months. Her husband reports it is worsening for the past couple of weeks. Her husband reports 3 days ago an episode started at 1am and lasted until 5am. She denies having any SOB or chest pain at this time. Her EKG results were normal during this visit. She drinks one cup of coffee and one cup of tea every day. She notes her symptoms worsened after she started taking OTC cough medication or children.    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    Past Surgical History:  Procedure Laterality Date   APPENDECTOMY     6 YOA   CESAREAN SECTION  01/06/2013   Procedure: CESAREAN SECTION;  Surgeon: Eldred Manges, MD;  Location: [facility];  Service: Obstetrics;  Laterality: N/A;  Primary   CESAREAN SECTION N/A 10/09/2014   Procedure: CESAREAN SECTION;  Surgeon: Alinda Dooms, MD;  Location: [facility];  Service: Obstetrics;  Laterality:  N/A;   CESAREAN SECTION N/A 08/09/2017   Procedure: REPEAT CESAREAN SECTION;  Surgeon: Lavonia Drafts, MD;  Location: [facility];  Service: Obstetrics;  Laterality: N/A;   CHOLECYSTECTOMY N/A 11/27/2014   Procedure: LAPAROSCOPIC CHOLECYSTECTOMY WITH INTRAOPERATIVE CHOLANGIOGRAM;  Surgeon: Autumn Messing III, MD;  Location: [facility];  Service: General;  Laterality: N/A;   CYSTECTOMY     SHOULDER ARTHROSCOPY WITH SUBACROMIAL DECOMPRESSION Left 02/27/2021   Procedure: LEFT SHOULDER ARTHROSCOPY WITH DEBRIDEMENT AND SUBACROMIAL DECOMPRESSION;  Surgeon: Mcarthur Rossetti, MD;  Location: [facility];  Service: Orthopedics;  Laterality: Left;    Family History  Problem Relation Age of Onset   Heart disease Mother        PALPITATIONS   Hypertension Mother     Social History   Socioeconomic History   Marital status: Married    Spouse name: Rema Fendt   Number of children: 0   Years of education: 14   Highest education level: Not on file  Occupational History   Occupation: STUDENT    Comment: [employer]  Tobacco Use   Smoking status: Never   Smokeless tobacco: Never  Substance and Sexual Activity   Alcohol use: No   Drug use: No   Sexual activity: Yes    Partners: Male    Birth control/protection: None  Other Topics Concern  Not on file  Social History Narrative   Not on file   Social Determinants of Health   Financial Resource Strain: Not on file  Food Insecurity: Not on file  Transportation Needs: Not on file  Physical Activity: Not on file  Stress: Not on file  Social Connections: Not on file  Intimate Partner Violence: Not on file    Outpatient Medications Prior to Visit  Medication Sig Dispense Refill   cetirizine (ZYRTEC) 10 MG tablet Take 1 tablet (10 mg total) by mouth daily. 90 tablet 3   etodolac (LODINE) 500 MG tablet Take 1 tablet (500 mg total) by mouth 2 (two) times daily between meals as needed. 60 tablet 3   meloxicam (MOBIC) 15  MG tablet Take 1 tablet (15 mg total) by mouth daily. 60 tablet 1   valACYclovir (VALTREX) 500 MG tablet Take 1 tablet (500 mg total) by mouth daily. 90 tablet 3   VITAMIN D PO Take by mouth.     acyclovir ointment (ZOVIRAX) 5 % Apply 1 application topically 2 (two) times daily. (Patient not taking: Reported on 10/10/2021) 5 g 1   No facility-administered medications prior to visit.    No Known Allergies  Review of Systems  Respiratory:  Negative for shortness of breath.   Cardiovascular:  Positive for palpitations. Negative for chest pain.      Objective:    Physical Exam Constitutional:      General: She is not in acute distress.    Appearance: Normal appearance. She is not ill-appearing.  HENT:     Head: Normocephalic and atraumatic.     Right Ear: External ear normal.     Left Ear: External ear normal.  Eyes:     Extraocular Movements: Extraocular movements intact.     Pupils: Pupils are equal, round, and reactive to light.  Cardiovascular:     Rate and Rhythm: Normal rate and regular rhythm.     Heart sounds: Normal heart sounds. No murmur heard.   No gallop.  Pulmonary:     Effort: Pulmonary effort is normal. No respiratory distress.     Breath sounds: Normal breath sounds. No wheezing or rales.  Skin:    General: Skin is warm and dry.  Neurological:     Mental Status: She is alert and oriented to person, place, and time.  Psychiatric:        Behavior: Behavior normal.    BP 132/60   Pulse 87   Temp 98.5 F (36.9 C) (Oral)   Resp 16   Ht _0  (1.575 m)   Wt 126 lb (57.2 kg)   SpO2 100%   BMI 23.05 kg/m  Wt Readings from Last 3 Encounters:  11/17/21 126 lb (57.2 kg)  10/10/21 126 lb (57.2 kg)  09/23/21 124 lb 12.8 oz (56.6 kg)       Assessment & Plan:   Problem List Items Addressed This Visit       Unprioritized   Palpitations - Primary    New.  EKG tracing is personally reviewed.  EKG notes NSR.  No acute changes.   Pt is advised as  follows:  Please complete lab work prior to leaving. You should be contacted about scheduling your appointment with cardiology. Please go to ER if you develop palpitations with heart rate >110, or if palpitations do not resolve in a few minutes.       Relevant Orders   EKG 12-Lead (Completed)   Comp Met (CMET)  CBC with Differential/Platelet   TSH   Ambulatory referral to Cardiology     No orders of the defined types were placed in this encounter.   I, Debbrah Alar NP, personally preformed the services described in this documentation.  All medical record entries made by the scribe were at my direction and in my presence.  I have reviewed the chart and discharge instructions (if applicable) and agree that the record reflects my personal performance and is accurate and complete. 11/17/2021   I,Shehryar Baig,acting as a Education administrator for Nance Pear, NP.,have documented all relevant documentation on the behalf of Nance Pear, NP,as directed by  Nance Pear, NP while in the presence of Nance Pear, NP.   Nance Pear, NP

## 2021-11-17 NOTE — Assessment & Plan Note (Signed)
New.  EKG tracing is personally reviewed.  EKG notes NSR.  No acute changes.   Pt is advised as follows:  Please complete lab work prior to leaving. You should be contacted about scheduling your appointment with cardiology. Please go to ER if you develop palpitations with heart rate >110, or if palpitations do not resolve in a few minutes.

## 2021-11-25 DIAGNOSIS — N926 Irregular menstruation, unspecified: Secondary | ICD-10-CM | POA: Insufficient documentation

## 2021-11-25 DIAGNOSIS — Z8619 Personal history of other infectious and parasitic diseases: Secondary | ICD-10-CM | POA: Insufficient documentation

## 2021-11-25 DIAGNOSIS — N970 Female infertility associated with anovulation: Secondary | ICD-10-CM | POA: Insufficient documentation

## 2021-11-27 ENCOUNTER — Ambulatory Visit (INDEPENDENT_AMBULATORY_CARE_PROVIDER_SITE_OTHER): Payer: 59

## 2021-11-27 ENCOUNTER — Ambulatory Visit (INDEPENDENT_AMBULATORY_CARE_PROVIDER_SITE_OTHER): Payer: 59 | Admitting: Cardiology

## 2021-11-27 ENCOUNTER — Encounter: Payer: Self-pay | Admitting: Cardiology

## 2021-11-27 ENCOUNTER — Other Ambulatory Visit: Payer: Self-pay

## 2021-11-27 VITALS — BP 110/70 | HR 84 | Ht 60.0 in | Wt 126.0 lb

## 2021-11-27 DIAGNOSIS — E785 Hyperlipidemia, unspecified: Secondary | ICD-10-CM

## 2021-11-27 DIAGNOSIS — R0609 Other forms of dyspnea: Secondary | ICD-10-CM

## 2021-11-27 DIAGNOSIS — R002 Palpitations: Secondary | ICD-10-CM | POA: Diagnosis not present

## 2021-11-27 NOTE — Progress Notes (Signed)
Cardiology Consultation:    Date:  11/27/2021   ID:  Gabriela Le, DOB 1976/11/09, MRN [id number]  PCP:  Olive Bass, FNP  Cardiologist:  Gypsy Balsam, MD   Referring MD: Sandford Craze, NP   Chief Complaint  Patient presents with   Palpitations    History of Present Illness:    Gabriela Le is a 45 y.o. female who is being seen today for the evaluation of palpitations at the request of Sandford Craze, NP.  She is [place] from [place].  Past medical history significant for dyslipidemia, dyspnea on exertion and also palpitations she had difficult time to describe palpitations she feels the sensation in the lower neck typically when she is trying to relax I not sure exactly if heart was really speeds up just the strength sensation in the neck that lasts only for split-second so repeatedly.  I think what she may be feeling is extrasystole.  She denies having any chest pain tightness squeezing pressure burning chest.  She can walk climb stairs with no difficulties.  She sustained left shoulder rotator Injury and she is actually getting ready to have surgery for it.  She does have dyslipidemia but does not take any medications for it.  She never smoked she used some cough and she drinks a lot of tea.  Her mother recently and up having open heart surgery she is 19.  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    Past Surgical History:  Procedure Laterality Date   APPENDECTOMY     6 YOA   CESAREAN SECTION  01/06/2013   Procedure: CESAREAN SECTION;  Surgeon: Hal Morales, MD;  Location: [facility];  Service: Obstetrics;  Laterality: N/A;   Primary   CESAREAN SECTION N/A 10/09/2014   Procedure: CESAREAN SECTION;  Surgeon: Konrad Felix, MD;  Location: [facility];  Service: Obstetrics;  Laterality: N/A;   CESAREAN SECTION N/A 08/09/2017   Procedure: REPEAT CESAREAN SECTION;  Surgeon: Willodean Rosenthal, MD;  Location: [facility];  Service: Obstetrics;  Laterality: N/A;   CHOLECYSTECTOMY N/A 11/27/2014   Procedure: LAPAROSCOPIC CHOLECYSTECTOMY WITH INTRAOPERATIVE CHOLANGIOGRAM;  Surgeon: Chevis Pretty III, MD;  Location: [facility];  Service: General;  Laterality: N/A;   CYSTECTOMY     SHOULDER ARTHROSCOPY WITH SUBACROMIAL DECOMPRESSION Left 02/27/2021   Procedure: LEFT SHOULDER ARTHROSCOPY WITH DEBRIDEMENT AND SUBACROMIAL DECOMPRESSION;  Surgeon: [name], MD;  Location: [facility];  Service: Orthopedics;  Laterality: Left;    Current Medications: Current Meds  Medication Sig   cetirizine (ZYRTEC) 10 MG tablet Take 1 tablet (10 mg total) by mouth daily.   etodolac (LODINE) 500 MG tablet Take 1 tablet (500 mg total) by mouth 2 (two) times daily between meals as needed.   meloxicam (MOBIC) 15 MG tablet Take 1 tablet (15 mg total) by mouth daily.   valACYclovir (VALTREX) 500 MG tablet Take 1 tablet (500 mg total) by mouth daily.   VITAMIN D PO Take 1 tablet by mouth daily.     Allergies:   Patient has no known allergies.  Social History   Socioeconomic History   Marital status: Married    Spouse name: [name]   Number of children: 0   Years of education: 14   Highest education level: Not on file  Occupational History   Occupation: STUDENT    Comment: [employer]  Tobacco Use   Smoking status: Never   Smokeless tobacco: Never  Substance and Sexual Activity   Alcohol use: No   Drug use: No   Sexual activity: Yes    Partners: Male    Birth control/protection: None  Other Topics Concern   Not on file  Social History Narrative   Not on file   Social Determinants of Health    Financial Resource Strain: Not on file  Food Insecurity: Not on file  Transportation Needs: Not on file  Physical Activity: Not on file  Stress: Not on file  Social Connections: Not on file     Family History: The patient's family history includes Heart disease in her mother; Hypertension in her mother. ROS:   Please see the history of present illness.    All 14 point review of systems negative except as described per history of present illness.  EKGs/Labs/Other Studies Reviewed:    The following studies were reviewed today:   EKG:  EKG is  ordered today.  The ekg ordered today demonstrates normal sinus rhythm, normal P interval, normal QS complex duration morphology, no ST segment changes.  Recent Labs: 11/17/2021: ALT 7; BUN 11; Creatinine, Ser 0.51; Hemoglobin 11.6; Platelets 227.0; Potassium 3.9; Sodium 139; TSH 1.04  Recent Lipid Panel    Component Value Date/Time   CHOL 198 09/23/2021 1129   TRIG 224.0 (H) 09/23/2021 1129   HDL 44.00 09/23/2021 1129   CHOLHDL 4 09/23/2021 1129   VLDL 44.8 (H) 09/23/2021 1129   LDLCALC 134 (H) 08/13/2020 1250   LDLDIRECT 128.0 09/23/2021 1129    Physical Exam:    VS:  BP 110/70 (BP Location: Right Arm, Patient Position: Sitting)   Pulse 84   Ht 5' (1.524 m)   Wt 126 lb (57.2 kg)   SpO2 99%   BMI 24.61 kg/m     Wt Readings from Last 3 Encounters:  11/27/21 126 lb (57.2 kg)  11/17/21 126 lb (57.2 kg)  10/10/21 126 lb (57.2 kg)     GEN:  Well nourished, well developed in no acute distress HEENT: Normal NECK: No JVD; No carotid bruits LYMPHATICS: No lymphadenopathy CARDIAC: RRR, no murmurs, no rubs, no gallops RESPIRATORY:  Clear to auscultation without rales, wheezing or rhonchi  ABDOMEN: Soft, non-tender, non-distended MUSCULOSKELETAL:  No edema; No deformity  SKIN: Warm and dry NEUROLOGIC:  Alert and oriented x 3 PSYCHIATRIC:  Normal affect   ASSESSMENT:    1. Palpitations   2. Dyspnea on exertion   3.  Dyslipidemia    PLAN:    In order of problems listed above:  Palpitations.  I will schedule a Zio patch for 2 weeks to see if she got any significant arrhythmia.  As a part of evaluation echocardiogram will be performed.  I did review her lab work test her TSH were normal.  She tells me also that she got investigation for this problem done couple years ago which was negative. Dyspnea on exertion echocardiogram to be done to check left ventricle ejection fraction. Dyslipidemia, I will not initiate any medication yet but Will calculate her risk and then decide if we need to treat   Medication Adjustments/Labs and Tests Ordered:  Current medicines are reviewed at length with the patient today.  Concerns regarding medicines are outlined above.  No orders of the defined types were placed in this encounter.  No orders of the defined types were placed in this encounter.   Signed, Georgeanna Lea, MD, [facility]. 11/27/2021 4:14 PM    [facility]

## 2021-11-27 NOTE — Patient Instructions (Signed)
Medication Instructions:  Your physician recommends that you continue on your current medications as directed. Please refer to the Current Medication list given to you today.  *If you need a refill on your cardiac medications before your next appointment, please call your pharmacy*   Lab Work: None. If you have labs (blood work) drawn today and your tests are completely normal, you will receive your results only by: MyChart Message (if you have MyChart) OR A paper copy in the mail If you have any lab test that is abnormal or we need to change your treatment, we will call you to review the results.   Testing/Procedures: Your physician has requested that you have an echocardiogram. Echocardiography is a painless test that uses sound waves to create images of your heart. It provides your doctor with information about the size and shape of your heart and how well your heart's chambers and valves are working. This procedure takes approximately one hour. There are no restrictions for this procedure.  A zio monitor was ordered today. It will remain on for 14 days. You will then return monitor and event diary in provided box. It takes 1-2 weeks for report to be downloaded and returned to us. We will call you with the results. If monitor falls off or has orange flashing light, please call Zio for further instructions.     Follow-Up: At [facility], you and your health needs are our priority.  As part of our continuing mission to provide you with exceptional heart care, we have created designated Provider Care Teams.  These Care Teams include your primary Cardiologist (physician) and Advanced Practice Providers (APPs -  Physician Assistants and Nurse Practitioners) who all work together to provide you with the care you need, when you need it.  We recommend signing up for the patient portal called "MyChart".  Sign up information is provided on this After Visit Summary.  MyChart is used to connect  with patients for Virtual Visits (Telemedicine).  Patients are able to view lab/test results, encounter notes, upcoming appointments, etc.  Non-urgent messages can be sent to your provider as well.   To learn more about what you can do with MyChart, go to [email or web address].    Your next appointment:   2 month(s)  The format for your next appointment:   In Person  Provider:   Robert Krasowski, MD    Other Instructions  Echocardiogram An echocardiogram is a test that uses sound waves (ultrasound) to produce images of the heart. Images from an echocardiogram can provide important information about: Heart size and shape. The size and thickness and movement of your heart's walls. Heart muscle function and strength. Heart valve function or if you have stenosis. Stenosis is when the heart valves are too narrow. If blood is flowing backward through the heart valves (regurgitation). A tumor or infectious growth around the heart valves. Areas of heart muscle that are not working well because of poor blood flow or injury from a heart attack. Aneurysm detection. An aneurysm is a weak or damaged part of an artery wall. The wall bulges out from the normal force of blood pumping through the body. Tell a health care provider about: Any allergies you have. All medicines you are taking, including vitamins, herbs, eye drops, creams, and over-the-counter medicines. Any blood disorders you have. Any surgeries you have had. Any medical conditions you have. Whether you are pregnant or may be pregnant. What are the risks? Generally, this is a safe test. However,   problems may occur, including an allergic reaction to dye (contrast) that may be used during the test. What happens before the test? No specific preparation is needed. You may eat and drink normally. What happens during the test?  You will take off your clothes from the waist up and put on a hospital gown. Electrodes or  electrocardiogram (ECG)patches may be placed on your chest. The electrodes or patches are then connected to a device that monitors your heart rate and rhythm. You will lie down on a table for an ultrasound exam. A gel will be applied to your chest to help sound waves pass through your skin. A handheld device, called a transducer, will be pressed against your chest and moved over your heart. The transducer produces sound waves that travel to your heart and bounce back (or "echo" back) to the transducer. These sound waves will be captured in real-time and changed into images of your heart that can be viewed on a video monitor. The images will be recorded on a computer and reviewed by your health care provider. You may be asked to change positions or hold your breath for a short time. This makes it easier to get different views or better views of your heart. In some cases, you may receive contrast through an IV in one of your veins. This can improve the quality of the pictures from your heart. The procedure may vary among health care providers and hospitals. What can I expect after the test? You may return to your normal, everyday life, including diet, activities, and medicines, unless your health care provider tells you not to do that. Follow these instructions at home: It is up to you to get the results of your test. Ask your health care provider, or the department that is doing the test, when your results will be ready. Keep all follow-up visits. This is important. Summary An echocardiogram is a test that uses sound waves (ultrasound) to produce images of the heart. Images from an echocardiogram can provide important information about the size and shape of your heart, heart muscle function, heart valve function, and other possible heart problems. You do not need to do anything to prepare before this test. You may eat and drink normally. After the echocardiogram is completed, you may return to your  normal, everyday life, unless your health care provider tells you not to do that. This information is not intended to replace advice given to you by your health care provider. Make sure you discuss any questions you have with your health care provider. Document Revised: 08/27/2021 Document Reviewed: 08/06/2020 Elsevier Patient Education  2022 Elsevier Inc.   

## 2021-11-27 NOTE — Addendum Note (Signed)
Addended by: Hazle Quant on: 11/27/2021 04:20 PM   Modules accepted: Orders

## 2022-01-02 ENCOUNTER — Ambulatory Visit (HOSPITAL_BASED_OUTPATIENT_CLINIC_OR_DEPARTMENT_OTHER)
Admission: RE | Admit: 2022-01-02 | Discharge: 2022-01-02 | Disposition: A | Discharge status: Home / Self Care | Payer: 59 | Source: Ambulatory Visit | Attending: Cardiology | Admitting: Cardiology

## 2022-01-02 ENCOUNTER — Other Ambulatory Visit: Payer: Self-pay

## 2022-01-02 DIAGNOSIS — R002 Palpitations: Secondary | ICD-10-CM | POA: Diagnosis present

## 2022-01-02 DIAGNOSIS — R0609 Other forms of dyspnea: Secondary | ICD-10-CM | POA: Insufficient documentation

## 2022-01-02 DIAGNOSIS — E785 Hyperlipidemia, unspecified: Secondary | ICD-10-CM | POA: Diagnosis present

## 2022-01-02 LAB — ECHOCARDIOGRAM COMPLETE
AR max vel: 1.93 cm2
AV Area VTI: 2.08 cm2
AV Area mean vel: 1.85 cm2
AV Mean grad: 4 mmHg
AV Peak grad: 8 mmHg
Ao pk vel: 1.41 m/s
Area-P 1/2: 4.86 cm2
Calc EF: 72.8 %
S' Lateral: 2.1 cm
Single Plane A2C EF: 64.7 %
Single Plane A4C EF: 79.6 %

## 2022-01-15 ENCOUNTER — Telehealth: Payer: Self-pay | Admitting: *Deleted

## 2022-01-15 ENCOUNTER — Telehealth: Payer: Self-pay | Admitting: Orthopaedic Surgery

## 2022-01-15 NOTE — Telephone Encounter (Signed)
° °  Pre-operative Risk Assessment    Patient Name: Gabriela Le  DOB: 12-24-1976 MRN: [id number]      Request for Surgical Clearance    Procedure:   LEFT SHOULDER ARTHROSCOPY/RC REPAIR  Date of Surgery:  Clearance TBD                                 Surgeon:  DR. Doneen Poisson Surgeon's Group or Practice Name:  [facility] Phone number:  [phone] Fax number:  [phone]   Type of Clearance Requested:   - Medical    Type of Anesthesia:  General  & BLOCK   Additional requests/questions:    Elpidio Anis   01/15/2022, 3:52 PM

## 2022-01-15 NOTE — Telephone Encounter (Signed)
Pt came in stating she was supposed to get a call to set up surgery but no one ever reached out. Pt would like a CB to discuss getting on the schedule asap please.   [phone]

## 2022-01-16 NOTE — Telephone Encounter (Signed)
° °  Primary Cardiologist: Gypsy Balsam, MD  Chart reviewed as part of pre-operative protocol coverage. Given past medical history and time since last visit, based on ACC/AHA guidelines, Gabriela Le would be at acceptable risk for the planned procedure without further cardiovascular testing.   Her RCRI is class I risk, 0.4% risk of major cardiac event.  I will route this recommendation to the requesting party via Epic fax function and remove from pre-op pool.  Please call with questions.  Thomasene Ripple. Tamela Elsayed NP-C    01/16/2022, 7:58 AM [facility] [address] Office [phone] Fax [phone]

## 2022-01-20 ENCOUNTER — Other Ambulatory Visit: Payer: Self-pay | Admitting: Orthopaedic Surgery

## 2022-01-22 NOTE — Telephone Encounter (Signed)
I called and scheduled surgery. 

## 2022-02-03 ENCOUNTER — Other Ambulatory Visit: Payer: Self-pay | Admitting: Physician Assistant

## 2022-02-11 ENCOUNTER — Encounter (HOSPITAL_BASED_OUTPATIENT_CLINIC_OR_DEPARTMENT_OTHER): Payer: Self-pay | Admitting: Orthopaedic Surgery

## 2022-02-18 ENCOUNTER — Ambulatory Visit: Payer: 59 | Admitting: Cardiology

## 2022-02-19 ENCOUNTER — Other Ambulatory Visit: Payer: Self-pay

## 2022-02-19 ENCOUNTER — Ambulatory Visit (HOSPITAL_BASED_OUTPATIENT_CLINIC_OR_DEPARTMENT_OTHER): Payer: 59 | Admitting: Certified Registered Nurse Anesthetist

## 2022-02-19 ENCOUNTER — Encounter (HOSPITAL_BASED_OUTPATIENT_CLINIC_OR_DEPARTMENT_OTHER)
Admission: RE | Disposition: A | Discharge status: Home / Self Care | Payer: Self-pay | Source: Home / Self Care | Attending: Orthopaedic Surgery

## 2022-02-19 ENCOUNTER — Ambulatory Visit (HOSPITAL_BASED_OUTPATIENT_CLINIC_OR_DEPARTMENT_OTHER)
Admission: RE | Admit: 2022-02-19 | Discharge: 2022-02-19 | Disposition: A | Discharge status: Home / Self Care | Payer: 59 | Attending: Orthopaedic Surgery | Admitting: Orthopaedic Surgery

## 2022-02-19 ENCOUNTER — Encounter (HOSPITAL_BASED_OUTPATIENT_CLINIC_OR_DEPARTMENT_OTHER): Payer: Self-pay | Admitting: Orthopaedic Surgery

## 2022-02-19 DIAGNOSIS — M75122 Complete rotator cuff tear or rupture of left shoulder, not specified as traumatic: Secondary | ICD-10-CM | POA: Diagnosis not present

## 2022-02-19 DIAGNOSIS — S46012D Strain of muscle(s) and tendon(s) of the rotator cuff of left shoulder, subsequent encounter: Secondary | ICD-10-CM

## 2022-02-19 DIAGNOSIS — S46012A Strain of muscle(s) and tendon(s) of the rotator cuff of left shoulder, initial encounter: Secondary | ICD-10-CM | POA: Diagnosis not present

## 2022-02-19 DIAGNOSIS — M19012 Primary osteoarthritis, left shoulder: Secondary | ICD-10-CM | POA: Diagnosis not present

## 2022-02-19 DIAGNOSIS — K219 Gastro-esophageal reflux disease without esophagitis: Secondary | ICD-10-CM | POA: Insufficient documentation

## 2022-02-19 DIAGNOSIS — X500XXA Overexertion from strenuous movement or load, initial encounter: Secondary | ICD-10-CM | POA: Insufficient documentation

## 2022-02-19 HISTORY — PX: SHOULDER ARTHROSCOPY WITH ROTATOR CUFF REPAIR: SHX5685

## 2022-02-19 LAB — POCT PREGNANCY, URINE: Preg Test, Ur: NEGATIVE

## 2022-02-19 SURGERY — ARTHROSCOPY, SHOULDER, WITH ROTATOR CUFF REPAIR
Anesthesia: General | Site: Shoulder | Laterality: Left

## 2022-02-19 MED ORDER — OXYCODONE HCL 5 MG PO TABS
5.0000 mg | ORAL_TABLET | ORAL | 0 refills | Status: DC | PRN
Start: 2022-02-19 — End: 2022-02-25

## 2022-02-19 MED ORDER — FENTANYL CITRATE (PF) 100 MCG/2ML IJ SOLN
100.0000 ug | Freq: Once | INTRAMUSCULAR | Status: AC
  Administered 2022-02-19: 100 ug via INTRAVENOUS

## 2022-02-19 MED ORDER — PROPOFOL 10 MG/ML IV BOLUS
INTRAVENOUS | Status: AC
  Filled 2022-02-19: qty 20

## 2022-02-19 MED ORDER — SODIUM CHLORIDE 0.9 % IR SOLN
Status: DC | PRN
  Administered 2022-02-19 (×2): 3000 mL

## 2022-02-19 MED ORDER — PHENYLEPHRINE HCL (PRESSORS) 10 MG/ML IV SOLN
INTRAVENOUS | Status: AC
  Filled 2022-02-19: qty 1

## 2022-02-19 MED ORDER — MIDAZOLAM HCL 2 MG/2ML IJ SOLN
INTRAMUSCULAR | Status: AC
  Filled 2022-02-19: qty 2

## 2022-02-19 MED ORDER — ONDANSETRON HCL 4 MG/2ML IJ SOLN
INTRAMUSCULAR | Status: AC
  Filled 2022-02-19: qty 2

## 2022-02-19 MED ORDER — CEFAZOLIN SODIUM-DEXTROSE 2-4 GM/100ML-% IV SOLN
2.0000 g | INTRAVENOUS | Status: AC
  Administered 2022-02-19: 2 g via INTRAVENOUS

## 2022-02-19 MED ORDER — EPHEDRINE SULFATE-NACL 50-0.9 MG/10ML-% IV SOSY
PREFILLED_SYRINGE | INTRAVENOUS | Status: DC | PRN
  Administered 2022-02-19 (×3): 5 mg via INTRAVENOUS

## 2022-02-19 MED ORDER — PROPOFOL 10 MG/ML IV BOLUS
INTRAVENOUS | Status: DC | PRN
  Administered 2022-02-19: 200 mg via INTRAVENOUS

## 2022-02-19 MED ORDER — DEXAMETHASONE SODIUM PHOSPHATE 10 MG/ML IJ SOLN
INTRAMUSCULAR | Status: DC | PRN
  Administered 2022-02-19: 5 mg via INTRAVENOUS

## 2022-02-19 MED ORDER — BUPIVACAINE LIPOSOME 1.3 % IJ SUSP
INTRAMUSCULAR | Status: DC | PRN
  Administered 2022-02-19: 10 mL via PERINEURAL

## 2022-02-19 MED ORDER — FENTANYL CITRATE (PF) 100 MCG/2ML IJ SOLN
INTRAMUSCULAR | Status: AC
  Filled 2022-02-19: qty 2

## 2022-02-19 MED ORDER — DEXAMETHASONE SODIUM PHOSPHATE 10 MG/ML IJ SOLN
INTRAMUSCULAR | Status: AC
  Filled 2022-02-19: qty 1

## 2022-02-19 MED ORDER — FENTANYL CITRATE (PF) 100 MCG/2ML IJ SOLN
INTRAMUSCULAR | Status: DC | PRN
  Administered 2022-02-19: 25 ug via INTRAVENOUS

## 2022-02-19 MED ORDER — BUPIVACAINE HCL (PF) 0.5 % IJ SOLN
INTRAMUSCULAR | Status: DC | PRN
  Administered 2022-02-19: 20 mL via PERINEURAL

## 2022-02-19 MED ORDER — LIDOCAINE 2% (20 MG/ML) 5 ML SYRINGE
INTRAMUSCULAR | Status: DC | PRN
Start: 2022-02-19 — End: 2022-02-19
  Administered 2022-02-19: 80 mg via INTRAVENOUS

## 2022-02-19 MED ORDER — CEFAZOLIN SODIUM-DEXTROSE 2-4 GM/100ML-% IV SOLN
INTRAVENOUS | Status: AC
  Filled 2022-02-19: qty 100

## 2022-02-19 MED ORDER — LACTATED RINGERS IV SOLN: INTRAVENOUS | Status: DC

## 2022-02-19 MED ORDER — ONDANSETRON HCL 4 MG/2ML IJ SOLN
INTRAMUSCULAR | Status: DC | PRN
  Administered 2022-02-19: 4 mg via INTRAVENOUS

## 2022-02-19 MED ORDER — MIDAZOLAM HCL 2 MG/2ML IJ SOLN
2.0000 mg | Freq: Once | INTRAMUSCULAR | Status: AC
  Administered 2022-02-19: 2 mg via INTRAVENOUS

## 2022-02-19 MED ORDER — PHENYLEPHRINE HCL-NACL 20-0.9 MG/250ML-% IV SOLN
INTRAVENOUS | Status: DC | PRN
  Administered 2022-02-19: 35 ug/min via INTRAVENOUS

## 2022-02-19 MED ORDER — LIDOCAINE 2% (20 MG/ML) 5 ML SYRINGE
INTRAMUSCULAR | Status: AC
  Filled 2022-02-19: qty 5

## 2022-02-19 SURGICAL SUPPLY — 65 items
AID PSTN UNV HD RSTRNT DISP (MISCELLANEOUS) ×1
ANCH SUT SWLK 19.1X4.75 (Anchor) ×1 IMPLANT
ANCHOR SUT BIO SW 4.75X19.1 (Anchor) ×1 IMPLANT
BLADE SURG 15 STRL LF DISP TIS (BLADE) IMPLANT
BLADE SURG 15 STRL SS (BLADE)
BURR OVAL 8 FLU 4.0X13 (MISCELLANEOUS) IMPLANT
BURR OVAL 8 FLU 5.0X13 (MISCELLANEOUS) IMPLANT
CANNULA 5.75X71 LONG (CANNULA) IMPLANT
CANNULA TWIST IN 8.25X7CM (CANNULA) ×1 IMPLANT
DISSECTOR  3.8MM X 13CM (MISCELLANEOUS) ×1
DISSECTOR 3.8MM X 13CM (MISCELLANEOUS) IMPLANT
DISSECTOR 4.0MM X 13CM (MISCELLANEOUS) ×2 IMPLANT
DRAPE IMP U-DRAPE 54X76 (DRAPES) ×2 IMPLANT
DRAPE INCISE IOBAN 66X45 STRL (DRAPES) ×1 IMPLANT
DRAPE SHOULDER BEACH CHAIR (DRAPES) ×2 IMPLANT
DRAPE U-SHAPE 47X51 STRL (DRAPES) ×2 IMPLANT
DRSG PAD ABDOMINAL 8X10 ST (GAUZE/BANDAGES/DRESSINGS) ×2 IMPLANT
DURAPREP 26ML APPLICATOR (WOUND CARE) ×2 IMPLANT
ELECT REM PT RETURN 9FT ADLT (ELECTROSURGICAL)
ELECTRODE REM PT RTRN 9FT ADLT (ELECTROSURGICAL) IMPLANT
GAUZE SPONGE 4X4 12PLY STRL (GAUZE/BANDAGES/DRESSINGS) ×2 IMPLANT
GAUZE SPONGE 4X4 12PLY STRL LF (GAUZE/BANDAGES/DRESSINGS) ×1 IMPLANT
GAUZE XEROFORM 1X8 LF (GAUZE/BANDAGES/DRESSINGS) ×2 IMPLANT
GLOVE SRG 8 PF TXTR STRL LF DI (GLOVE) ×2 IMPLANT
GLOVE SURG ENC MOIS LTX SZ7.5 (GLOVE) ×2 IMPLANT
GLOVE SURG ORTHO LTX SZ8 (GLOVE) ×2 IMPLANT
GLOVE SURG UNDER POLY LF SZ8 (GLOVE) ×4
GOWN STRL REUS W/ TWL LRG LVL3 (GOWN DISPOSABLE) ×1 IMPLANT
GOWN STRL REUS W/ TWL XL LVL3 (GOWN DISPOSABLE) ×1 IMPLANT
GOWN STRL REUS W/TWL LRG LVL3 (GOWN DISPOSABLE) ×2
GOWN STRL REUS W/TWL XL LVL3 (GOWN DISPOSABLE) ×2
IV NS IRRIG 3000ML ARTHROMATIC (IV SOLUTION) ×3 IMPLANT
KIT SHOULDER TRACTION (DRAPES) ×2 IMPLANT
MANIFOLD NEPTUNE II (INSTRUMENTS) ×2 IMPLANT
NDL SAFETY ECLIPSE 18X1.5 (NEEDLE) IMPLANT
NDL SCORPION MULTI FIRE (NEEDLE) IMPLANT
NEEDLE HYPO 18GX1.5 SHARP (NEEDLE)
NEEDLE SCORPION MULTI FIRE (NEEDLE) ×2 IMPLANT
NS IRRIG 1000ML POUR BTL (IV SOLUTION) IMPLANT
PACK ARTHROSCOPY DSU (CUSTOM PROCEDURE TRAY) ×2 IMPLANT
PACK BASIN DAY SURGERY FS (CUSTOM PROCEDURE TRAY) ×2 IMPLANT
PAD ORTHO SHOULDER 7X19 LRG (SOFTGOODS) IMPLANT
PENCIL SMOKE EVACUATOR (MISCELLANEOUS) IMPLANT
PORT APPOLLO RF 90DEGREE MULTI (SURGICAL WAND) ×2 IMPLANT
RESTRAINT HEAD UNIVERSAL NS (MISCELLANEOUS) ×2 IMPLANT
SLEEVE SCD COMPRESS KNEE MED (STOCKING) ×2 IMPLANT
SLING ARM FOAM STRAP LRG (SOFTGOODS) IMPLANT
SLING ULTRA II MEDIUM (SOFTGOODS) ×1 IMPLANT
SPIKE FLUID TRANSFER (MISCELLANEOUS) IMPLANT
SPONGE T-LAP 4X18 ~~LOC~~+RFID (SPONGE) IMPLANT
SUCTION FRAZIER HANDLE 10FR (MISCELLANEOUS)
SUCTION TUBE FRAZIER 10FR DISP (MISCELLANEOUS) IMPLANT
SUT ETHIBOND 2 OS 4 DA (SUTURE) IMPLANT
SUT ETHILON 3 0 PS 1 (SUTURE) ×2 IMPLANT
SUT TIGER TAPE 7 IN WHITE (SUTURE) IMPLANT
SUT VIC AB 2-0 SH 27 (SUTURE)
SUT VIC AB 2-0 SH 27XBRD (SUTURE) IMPLANT
SYR 20ML LL LF (SYRINGE) IMPLANT
SYR BULB EAR ULCER 3OZ GRN STR (SYRINGE) IMPLANT
TAPE FIBER 2MM 7IN #2 BLUE (SUTURE) IMPLANT
TAPE HYPAFIX 6X30 (GAUZE/BANDAGES/DRESSINGS) ×2 IMPLANT
TOWEL GREEN STERILE FF (TOWEL DISPOSABLE) ×2 IMPLANT
TUBE CONNECTING 20X1/4 (TUBING) ×2 IMPLANT
TUBING ARTHROSCOPY IRRIG 16FT (MISCELLANEOUS) ×2 IMPLANT
WATER STERILE IRR 1000ML POUR (IV SOLUTION) ×2 IMPLANT

## 2022-02-19 NOTE — Anesthesia Preprocedure Evaluation (Signed)
Anesthesia Evaluation  Patient identified by MRN, date of birth, ID band Patient awake    Reviewed: Allergy & Precautions, NPO status , Patient's Chart, lab work & pertinent test results  History of Anesthesia Complications Negative for: history of anesthetic complications  Airway Mallampati: II  TM Distance: >3 FB Neck ROM: Full    Dental no notable dental hx.    Pulmonary neg pulmonary ROS,    Pulmonary exam normal        Cardiovascular negative cardio ROS Normal cardiovascular exam     Neuro/Psych negative neurological ROS  negative psych ROS   GI/Hepatic Neg liver ROS, GERD  Controlled,  Endo/Other  negative endocrine ROS  Renal/GU negative Renal ROS  negative genitourinary   Musculoskeletal  (+) Arthritis , Osteoarthritis,  left shoulder impingement   Abdominal   Peds  Hematology negative hematology ROS (+)   Anesthesia Other Findings Day of surgery medications reviewed with patient.  Reproductive/Obstetrics negative OB ROS                             Anesthesia Physical  Anesthesia Plan  ASA: II  Anesthesia Plan: General   Post-op Pain Management: GA combined w/ Regional for post-op pain and Regional block* and Minimal or no pain anticipated   Induction: Intravenous  PONV Risk Score and Plan: 3 and Treatment may vary due to age or medical condition, Ondansetron, Dexamethasone and Midazolam  Airway Management Planned: LMA  Additional Equipment: None  Intra-op Plan:   Post-operative Plan: Extubation in OR  Informed Consent: I have reviewed the patients History and Physical, chart, labs and discussed the procedure including the risks, benefits and alternatives for the proposed anesthesia with the patient or authorized representative who has indicated his/her understanding and acceptance.     Dental advisory given and Interpreter used for interveiw  Plan Discussed  with: CRNA  Anesthesia Plan Comments:         Anesthesia Quick Evaluation

## 2022-02-19 NOTE — Discharge Instructions (Addendum)
Post Anesthesia Home Care Instructions  Activity: Get plenty of rest for the remainder of the day. A responsible individual must stay with you for 24 hours following the procedure.  For the next 24 hours, DO NOT: -Drive a car -Advertising copywriter -Drink alcoholic beverages -Take any medication unless instructed by your physician -Make any legal decisions or sign important papers.  Meals: Start with liquid foods such as gelatin or soup. Progress to regular foods as tolerated. Avoid greasy, spicy, heavy foods. If nausea and/or vomiting occur, drink only clear liquids until the nausea and/or vomiting subsides. Call your physician if vomiting continues.  Special Instructions/Symptoms: Your throat may feel dry or sore from the anesthesia or the breathing tube placed in your throat during surgery. If this causes discomfort, gargle with warm salt water. The discomfort should disappear within 24 hours.  If you had a scopolamine patch placed behind your ear for the management of post- operative nausea and/or vomiting:  1. The medication in the patch is effective for 72 hours, after which it should be removed.  Wrap patch in a tissue and discard in the trash. Wash hands thoroughly with soap and water. 2. You may remove the patch earlier than 72 hours if you experience unpleasant side effects which may include dry mouth, dizziness or visual disturbances. 3. Avoid touching the patch. Wash your hands with soap and water after contact with the patch.     Regional Anesthesia Blocks  1. Numbness or the inability to move the "blocked" extremity may last from 3-48 hours after placement. The length of time depends on the medication injected and your individual response to the medication. If the numbness is not going away after 48 hours, call your surgeon.  2. The extremity that is blocked will need to be protected until the numbness is gone and the  Strength has returned. Because you cannot feel it, you will  need to take extra care to avoid injury. Because it may be weak, you may have difficulty moving it or using it. You may not know what position it is in without looking at it while the block is in effect.  3. For blocks in the legs and feet, returning to weight bearing and walking needs to be done carefully. You will need to wait until the numbness is entirely gone and the strength has returned. You should be able to move your leg and foot normally before you try and bear weight or walk. You will need someone to be with you when you first try to ensure you do not fall and possibly risk injury.  4. Bruising and tenderness at the needle site are common side effects and will resolve in a few days.  5. Persistent numbness or new problems with movement should be communicated to the surgeon or the [facility] [phone] [facility] [phone]).   Information for Discharge Teaching: EXPAREL (bupivacaine liposome injectable suspension)   Your surgeon or anesthesiologist gave you EXPAREL(bupivacaine) to help control your pain after surgery.  EXPAREL is a local anesthetic that provides pain relief by numbing the tissue around the surgical site. EXPAREL is designed to release pain medication over time and can control pain for up to 72 hours. Depending on how you respond to EXPAREL, you may require less pain medication during your recovery.  Possible side effects: Temporary loss of sensation or ability to move in the area where bupivacaine was injected. Nausea, vomiting, constipation Rarely, numbness and tingling in your mouth  or lips, lightheadedness, or anxiety may occur. Call your doctor right away if you think you may be experiencing any of these sensations, or if you have other questions regarding possible side effects.  Follow all other discharge instructions given to you by your surgeon or nurse. Eat a healthy diet and drink plenty of water or other fluids.  If you  return to the hospital for any reason within 96 hours following the administration of EXPAREL, it is important for health care providers to know that you have received this anesthetic. A teal colored band has been placed on your arm with the date, time and amount of EXPAREL you have received in order to alert and inform your health care providers. Please leave this armband in place for the full 96 hours following administration, and then you may remove the band.   Orthopedic Surgical Instructions: Do expect shoulder pain and swelling -for ice intermittently as needed. Do expect some bloody drainage from the shoulder incisions. Leave the dressing in place for the next 24 to 48 hours. You may shower in 2 days and get your incisions wet. Place Band-Aids over your incisions daily after each shower. Wear your sling to bed. You may come out of the sling occasionally but do not like to try to lift your left arm above the shoulder level or reach behind you with the left arm. Wear the sling mostly throughout the day.  You may remove the sling for showers.

## 2022-02-19 NOTE — Op Note (Addendum)
NAMESHAKEMA, SURITA MEDICAL RECORD NO: [id number] ACCOUNT NO: [id number] DATE OF BIRTH: 1976/05/30 FACILITY: [facility] LOCATION: [facility] PHYSICIAN: Vanita Panda. Magnus Ivan, MD  Operative Report   DATE OF PROCEDURE: 02/19/2022  PREOPERATIVE DIAGNOSIS:  Left shoulder retracted full thickness rotator cuff tear.  POSTOPERATIVE DIAGNOSIS:  Left shoulder retracted full thickness rotator cuff tear.  PROCEDURE:  Left shoulder arthroscopy with arthroscopically assisted rotator cuff repair.  IMPLANTS:  Arthrex 4.75 bioabsorbable suture anchor and FiberWire suture.  SURGEON:  Vanita Panda. Magnus Ivan, MD.  ASSISTANT:  Richardean Canal, PA-C.  ANESTHESIA:   1.  Left shoulder regional block. 2.  General.  FINDINGS:  Full thickness retracted rotator cuff tear with repair, mainly of the infraspinatus tendon.  COMPLICATIONS:  None.  INDICATIONS:  The patient is a 46 year old non-English speaking female who injured her left shoulder several months ago when she was picking up some type of heavy suitcase and felt a pop on her shoulder.  A year before, she has had an arthroscopic  intervention of the shoulder, which is tendinosis found and the rotator cuff tear.  We had those arthroscopy pictures and her previous MRI.  We then obtained a new MRI this past fall and found this full thickness tear.  She now presents for an  arthroscopic intervention and an operative intervention of this left shoulder with attempted be able to try to repair the cuff.  Of note, there are signs of muscle atrophy on the MRI as well.  DESCRIPTION OF PROCEDURE:  After informed consent was obtained, appropriate left shoulder was marked.  A regional anesthesia was obtained to the left shoulder with a block in the holding room.  She was then brought to the operating room and placed supine  on the operating table.  General anesthesia was then obtained.  She was then fashioned in a beach chair position with appropriate positioning of  the head, neck, and padding ____ right arm.  There was bending at the waist and the knees and sequential  compression devices placed on both legs.  Her left operative shoulder was prepped and draped with DuraPrep and sterile drapes in place and in line skeletal traction using a fishing pole traction device with 10 pounds of traction and 45 degrees of forward  flexion and neutral rotation.  A timeout was called.  She was identified correct patient, correct left shoulder.  We then actually made a posterior portal, entered subacromial space without going into the glenohumeral joint based on what we saw from her  previous surgery and her MRI.  We then made two separate lateral portals and was able to see the significant rotator cuff tear.  There was significant retraction as well.  We used a Scorpion suture passer to place sutures through the anterior cuff of  the supraspinatus and the posterior cuff apart from the infraspinatus.  We then placed a suture anchor into the footprint on the greater tuberosity of the humeral head.  We pulled over the anterior sutures and pulled right through the tissue.  There was  no other anterior tissue that I could grab at all that was in ____ good, so we did place a horizontal mattress suture in the infraspinatus tendon and brought this over to our 4.75 suture anchor.  We did this in a single row because ____ anchor.  I then  put the shoulder through internal and external rotation, it did moved as a unit, but certainly this is a tenuous repair.  We removed all instrumentation from the  shoulder and closed the portal sites with interrupted nylon suture.  Xeroform well-padded  sterile dressing was applied and she was placed in a shoulder abduction pillow sling.  She was awakened, extubated, and taken to recovery room in stable condition with all final counts being correct.  No complications noted.  Postoperatively, we will  limit her activities with that shoulder until she follows  up with [place] in the office.  We will give her wound care instructions and other mobility instructions as well.  Of note,  Richardean Canal, PA-C did assist in entire case and the assistance was crucial  for facilitating all aspects of this case.  His assistance was crucial and medically necessary for suture management and management of the equipment including arthroscopic camera and positioning to help facilitate this case and his assistance was medically necessary.   CHR D: 02/19/2022 10:50:44 am T: 02/19/2022 1:10:00 pm  JOB: [id number]/ [id number]

## 2022-02-19 NOTE — H&P (Signed)
Gabriela Le is an 46 y.o. female.   Chief Complaint: Left shoulder pain and weakness  HPI: The patient is a 46 year old female with left shoulder pain and weakness after lifting a heavy suitcase.  After the failure of conservative treatment with rest, anti-inflammatories, steroid injections and time, a MRI was obtained of the left shoulder.  It does show a full-thickness retracted rotator cuff tear.  She has had a previous arthroscopic intervention of her shoulder about a year ago for debridement and subacromial decompression due to chronic tendinosis and tendinitis.  Those arthroscopic images showed no evidence of rotator cuff tear and her previous MRI before this accident of lifting a heavy suitcase showed no tear.  She now has significant weakness and pain and would recommend an arthroscopic intervention for her left shoulder.  She does not speak [place] and her husband interprets for her.  Having had surgery before she is fully aware of what this involves.  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    Past Surgical History:  Procedure Laterality Date   APPENDECTOMY     6 YOA   CESAREAN SECTION  01/06/2013   Procedure: CESAREAN SECTION;  Surgeon: Hal Morales, MD;  Location: [facility];  Service: Obstetrics;  Laterality: N/A;  Primary   CESAREAN SECTION N/A 10/09/2014   Procedure: CESAREAN SECTION;  Surgeon: Konrad Felix, MD;  Location: [facility];  Service: Obstetrics;  Laterality: N/A;   CESAREAN SECTION N/A 08/09/2017   Procedure: REPEAT CESAREAN SECTION;  Surgeon: Willodean Rosenthal, MD;  Location: [facility];  Service: Obstetrics;  Laterality: N/A;   CHOLECYSTECTOMY N/A 11/27/2014   Procedure: LAPAROSCOPIC CHOLECYSTECTOMY WITH  INTRAOPERATIVE CHOLANGIOGRAM;  Surgeon: Chevis Pretty III, MD;  Location: [facility];  Service: General;  Laterality: N/A;   CYSTECTOMY     SHOULDER ARTHROSCOPY WITH SUBACROMIAL DECOMPRESSION Left 02/27/2021   Procedure: LEFT SHOULDER ARTHROSCOPY WITH DEBRIDEMENT AND SUBACROMIAL DECOMPRESSION;  Surgeon: [name], MD;  Location: [facility];  Service: Orthopedics;  Laterality: Left;    Family History  Problem Relation Age of Onset   Heart disease Mother        PALPITATIONS   Hypertension Mother    Social History:  reports that she has never smoked. She has never used smokeless tobacco. She reports that she does not drink alcohol and does not use drugs.  Allergies: No Known Allergies  Medications Prior to Admission  Medication Sig Dispense Refill   Cetirizine HCl (ZYRTEC ALLERGY) 10 MG CAPS Take by mouth.     vitamin B-12 (CYANOCOBALAMIN) 100 MCG tablet Take 100 mcg by mouth daily.     VITAMIN D PO Take 1 tablet by mouth daily.     etodolac (LODINE) 500 MG tablet Take 1 tablet (500 mg total) by mouth 2 (two) times daily between meals as needed. 60 tablet 3    Results for orders placed or performed during the hospital encounter of 02/19/22 (from the past 48 hour(s))  Pregnancy, urine POC     Status: None   Collection Time: 02/19/22  7:57 AM  Result Value Ref Range   Preg Test, Ur NEGATIVE NEGATIVE    Comment:  THE SENSITIVITY OF THIS METHODOLOGY IS >24 mIU/mL    No results found.  Review of Systems  All other systems reviewed and are negative.  Blood pressure 114/68, pulse 73, temperature 97.8 F (36.6 C), temperature source Oral, resp. rate 10, height 5\' 2"  (1.575 m), weight 56.1 kg, last menstrual period 02/07/2022, SpO2 96 %. Physical Exam Vitals reviewed.  Constitutional:      Appearance: Normal appearance.  HENT:     Head: Normocephalic and atraumatic.  Eyes:     Extraocular Movements: Extraocular movements intact.     Pupils: Pupils are  equal, round, and reactive to light.  Cardiovascular:     Rate and Rhythm: Normal rate and regular rhythm.  Pulmonary:     Effort: Pulmonary effort is normal.     Breath sounds: Normal breath sounds.  Musculoskeletal:     Left shoulder: Tenderness and bony tenderness present. Decreased range of motion. Decreased strength.     Cervical back: Normal range of motion.  Neurological:     Mental Status: She is alert and oriented to person, place, and time.  Psychiatric:        Behavior: Behavior normal.     Assessment/Plan Left shoulder with pain, weakness and significant rotator cuff tear  The plan is to proceed to surgery today as an outpatient for a left shoulder arthroscopy with extensive debridement and repair of the rotator cuff as indicated and it is possible to repair the cuff.  The risk and benefits of surgery been explained in detail and informed consent was obtained.  The left operative shoulder has been marked.  04/07/2022, MD 02/19/2022, 9:08 AM

## 2022-02-19 NOTE — Transfer of Care (Signed)
Immediate Anesthesia Transfer of Care Note  Patient: Gabriela Le  Procedure(s) Performed: LEFT SHOULDER ARTHROSCOPY WITH ROTATOR CUFF REPAIR (Left: Shoulder)  Patient Location: PACU  Anesthesia Type:General and Regional  Level of Consciousness: awake and patient cooperative  Airway & Oxygen Therapy: Patient Spontanous Breathing and Patient connected to face mask oxygen  Post-op Assessment: Report given to RN and Post -op Vital signs reviewed and stable  Post vital signs: Reviewed and stable  Last Vitals:  Vitals Value Taken Time  BP 133/96   Temp    Pulse 86 02/19/22 1106  Resp 13 02/19/22 1106  SpO2 99 % 02/19/22 1106  Vitals shown include unvalidated device data.  Last Pain:  Vitals:   02/19/22 0811  TempSrc: Oral  PainSc: 2          Complications: No notable events documented.

## 2022-02-19 NOTE — Anesthesia Procedure Notes (Signed)
Anesthesia Regional Block: Interscalene brachial plexus block   Pre-Anesthetic Checklist: , timeout performed,  Correct Patient, Correct Site, Correct Laterality,  Correct Procedure, Correct Position, site marked,  Risks and benefits discussed,  Surgical consent,  Pre-op evaluation,  At surgeon's request and post-op pain management  Laterality: Left  Prep: chloraprep       Needles:  Injection technique: Single-shot  Needle Type: Stimiplex     Needle Length: 9cm  Needle Gauge: 21     Additional Needles:   Procedures:,,,, ultrasound used (permanent image in chart),,    Narrative:  Start time: 02/19/2022 8:47 AM End time: 02/19/2022 8:52 AM Injection made incrementally with aspirations every 5 mL.  Performed by: Personally  Anesthesiologist: Lowella Curb, MD

## 2022-02-19 NOTE — Progress Notes (Signed)
Assisted Dr. Miller with left, ultrasound guided, interscalene  block. Side rails up, monitors on throughout procedure. See vital signs in flow sheet. Tolerated Procedure well.  

## 2022-02-19 NOTE — Anesthesia Postprocedure Evaluation (Signed)
Anesthesia Post Note  Patient: Gabriela Le  Procedure(s) Performed: LEFT SHOULDER ARTHROSCOPY WITH ROTATOR CUFF REPAIR (Left: Shoulder)     Patient location during evaluation: PACU Anesthesia Type: General Level of consciousness: awake and alert Pain management: pain level controlled Vital Signs Assessment: post-procedure vital signs reviewed and stable Respiratory status: spontaneous breathing, nonlabored ventilation and respiratory function stable Cardiovascular status: blood pressure returned to baseline and stable Postop Assessment: no apparent nausea or vomiting Anesthetic complications: no   No notable events documented.  Last Vitals:  Vitals:   02/19/22 1145 02/19/22 1230  BP: 133/86 (!) 132/94  Pulse: 78 76  Resp: 14   Temp:  36.6 C  SpO2: 95% 96%    Last Pain:  Vitals:   02/19/22 1230  TempSrc:   PainSc: 0-No pain                 Lowella Curb

## 2022-02-19 NOTE — Anesthesia Procedure Notes (Signed)
Procedure Name: LMA Insertion Date/Time: 02/19/2022 9:36 AM Performed by: Audie Pinto, CRNA Pre-anesthesia Checklist: Patient identified, Emergency Drugs available, Suction available and Patient being monitored Patient Re-evaluated:Patient Re-evaluated prior to induction Oxygen Delivery Method: Circle system utilized Preoxygenation: Pre-oxygenation with 100% oxygen Induction Type: IV induction LMA: LMA inserted LMA Size: 3.0 Placement Confirmation: positive ETCO2 Dental Injury: Teeth and Oropharynx as per pre-operative assessment

## 2022-02-19 NOTE — Brief Op Note (Signed)
02/19/2022  10:52 AM  PATIENT:  Gabriela Le  46 y.o. female  PRE-OPERATIVE DIAGNOSIS:  left shoulder full-thickness rotator cuff tear  POST-OPERATIVE DIAGNOSIS:  left shoulder full-thickness rotator cuff tear  PROCEDURE:  Procedure(s): LEFT SHOULDER ARTHROSCOPY WITH ROTATOR CUFF REPAIR (Left)  SURGEON:  Surgeon(s) and Role:    Mcarthur Rossetti, MD - Primary  PHYSICIAN ASSISTANT:  Benita Stabile, PA-C  ANESTHESIA:   regional and general  COUNTS:  YES  DICTATION: .Other Dictation: Dictation Number [id number]  PLAN OF CARE: Discharge to home after PACU  PATIENT DISPOSITION:  PACU - hemodynamically stable.   Delay start of Pharmacological VTE agent (>24hrs) due to surgical blood loss or risk of bleeding: no

## 2022-02-20 ENCOUNTER — Telehealth: Payer: Self-pay | Admitting: Orthopaedic Surgery

## 2022-02-20 ENCOUNTER — Encounter (HOSPITAL_BASED_OUTPATIENT_CLINIC_OR_DEPARTMENT_OTHER): Payer: Self-pay | Admitting: Orthopaedic Surgery

## 2022-02-20 ENCOUNTER — Other Ambulatory Visit: Payer: Self-pay | Admitting: Orthopaedic Surgery

## 2022-02-20 MED ORDER — TIZANIDINE HCL 4 MG PO TABS: 4.0000 mg | ORAL_TABLET | Freq: Three times a day (TID) | ORAL | 0 refills | Status: DC | PRN

## 2022-02-20 MED ORDER — HYDROCODONE-ACETAMINOPHEN 7.5-325 MG PO TABS: 1.0000 | ORAL_TABLET | Freq: Four times a day (QID) | ORAL | 0 refills | Status: DC | PRN

## 2022-02-20 NOTE — Telephone Encounter (Signed)
I called and talked to the son. He stated the pain medication isnt helping her pain and causing her to itch. She is also taking tylenol and ibu. She isnt on a muscle relaxer. Please advise

## 2022-02-20 NOTE — Telephone Encounter (Signed)
I talked to the son. Pt would like to switch to the hydrocodone. Says the oxy makes her itch. Can you please send this in

## 2022-02-20 NOTE — Telephone Encounter (Signed)
Patient's husband called. He has some questions about her medication. Would like a call back. [phone]

## 2022-02-20 NOTE — Telephone Encounter (Signed)
Called pharmacy. Med is ready for pick...lvm advising son

## 2022-02-21 ENCOUNTER — Other Ambulatory Visit: Payer: Self-pay | Admitting: Orthopaedic Surgery

## 2022-02-25 ENCOUNTER — Other Ambulatory Visit: Payer: Self-pay | Admitting: Orthopaedic Surgery

## 2022-02-25 ENCOUNTER — Telehealth: Payer: Self-pay

## 2022-02-25 ENCOUNTER — Encounter: Payer: Self-pay | Admitting: Orthopaedic Surgery

## 2022-02-25 ENCOUNTER — Other Ambulatory Visit: Payer: Self-pay

## 2022-02-25 ENCOUNTER — Ambulatory Visit (INDEPENDENT_AMBULATORY_CARE_PROVIDER_SITE_OTHER): Payer: 59 | Admitting: Orthopaedic Surgery

## 2022-02-25 DIAGNOSIS — Z9889 Other specified postprocedural states: Secondary | ICD-10-CM

## 2022-02-25 DIAGNOSIS — S46012D Strain of muscle(s) and tendon(s) of the rotator cuff of left shoulder, subsequent encounter: Secondary | ICD-10-CM

## 2022-02-25 MED ORDER — OXYCODONE HCL 5 MG PO TABS: 5.0000 mg | ORAL_TABLET | ORAL | 0 refills | Status: DC | PRN

## 2022-02-25 MED ORDER — MELOXICAM 15 MG PO TABS
15.0000 mg | ORAL_TABLET | Freq: Every day | ORAL | 3 refills | Status: DC | PRN
Start: 2022-02-25 — End: 2022-03-03

## 2022-02-25 MED ORDER — ETODOLAC 500 MG PO TABS: 500.0000 mg | ORAL_TABLET | Freq: Two times a day (BID) | ORAL | 3 refills | Status: DC | PRN

## 2022-02-25 NOTE — Telephone Encounter (Signed)
Her insurance isn't covering the Etodolac ?"Preferred Drugs" are Ketorolac, meloxicam, naproxen ?

## 2022-02-25 NOTE — Progress Notes (Signed)
The patient is 6 days status post a left shoulder arthroscopy with rotator cuff repair.  We were only able to repair of the infraspinatus tendon portion of the rotator cuff.  The sutures pulled through the supraspinatus portion and did not have good soft tissue quality.  She is with her husband today who interprets for her.  She is in need of more pain medications. ? ?The left shoulder has pain throughout the arc of motion.  The incision sites have healed well so sutures been removed and Steri-Strips placed. ? ?I did stress the importance of staying in her sling and coming out of it for gentle motion of the shoulder.  We need to set her up for outpatient physical therapy with a therapist going very slow given the tenuous nature of the repair.  I will see her back in 4 weeks to see how she is doing overall.  I will send in some oxycodone and an NSAID. ?

## 2022-03-02 ENCOUNTER — Telehealth: Payer: Self-pay | Admitting: Orthopaedic Surgery

## 2022-03-02 NOTE — Telephone Encounter (Signed)
Placed at the front desk. Called number and advised  ?

## 2022-03-02 NOTE — Telephone Encounter (Signed)
Pt asking for a work note stating she will be out at least a month post surg date to give to her work. The best call back number to let them know it is ready to be picked up is [phone].  ?

## 2022-03-02 NOTE — Telephone Encounter (Signed)
How long do you plan to have her out of work? ?

## 2022-03-02 NOTE — Telephone Encounter (Signed)
Note completed 

## 2022-03-03 ENCOUNTER — Other Ambulatory Visit: Payer: Self-pay

## 2022-03-03 ENCOUNTER — Encounter: Payer: Self-pay | Admitting: Cardiology

## 2022-03-03 ENCOUNTER — Ambulatory Visit (INDEPENDENT_AMBULATORY_CARE_PROVIDER_SITE_OTHER): Payer: 59 | Admitting: Cardiology

## 2022-03-03 VITALS — BP 116/68 | HR 73 | Ht 62.0 in | Wt 125.0 lb

## 2022-03-03 DIAGNOSIS — E785 Hyperlipidemia, unspecified: Secondary | ICD-10-CM | POA: Diagnosis not present

## 2022-03-03 DIAGNOSIS — I471 Supraventricular tachycardia, unspecified: Secondary | ICD-10-CM

## 2022-03-03 DIAGNOSIS — R002 Palpitations: Secondary | ICD-10-CM | POA: Diagnosis not present

## 2022-03-03 DIAGNOSIS — R0609 Other forms of dyspnea: Secondary | ICD-10-CM | POA: Diagnosis not present

## 2022-03-03 NOTE — Therapy (Signed)
OUTPATIENT PHYSICAL THERAPY SHOULDER EVALUATION   Patient Name: Gabriela Le MRN: [id number] DOB:1976/03/14, 46 y.o., female Today's Date: 03/05/2022   PT End of Session - 03/05/22 2257     Visit Number 1    Number of Visits 12    Date for PT Re-Evaluation 05/08/22    Authorization Type [employer]    Progress Note Due on Visit 10    PT Start Time 1549    PT Stop Time 1640    PT Time Calculation (min) 51 min    Activity Tolerance Patient tolerated treatment well    Behavior During Therapy WFL for tasks assessed/performed             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;  Service: Obstetrics;  Laterality: N/A;  Primary   CESAREAN SECTION N/A 10/09/2014   Procedure: CESAREAN SECTION;  Surgeon: Alinda Dooms, MD;  Location: [facility];  Service: Obstetrics;  Laterality: N/A;   CESAREAN SECTION N/A 08/09/2017   Procedure: REPEAT CESAREAN SECTION;  Surgeon: Lavonia Drafts, MD;  Location: [place];  Service: Obstetrics;  Laterality: N/A;   CHOLECYSTECTOMY N/A 11/27/2014   Procedure: LAPAROSCOPIC CHOLECYSTECTOMY WITH INTRAOPERATIVE CHOLANGIOGRAM;  Surgeon: Autumn Messing III, MD;  Location: [facility];  Service: General;  Laterality: N/A;   CYSTECTOMY     SHOULDER ARTHROSCOPY WITH ROTATOR CUFF REPAIR Left 02/19/2022   Procedure: LEFT SHOULDER ARTHROSCOPY WITH ROTATOR CUFF REPAIR;  Surgeon: Mcarthur Rossetti, MD;  Location: [place];  Service: Orthopedics;  Laterality: Left;   SHOULDER ARTHROSCOPY  WITH SUBACROMIAL DECOMPRESSION Left 02/27/2021   Procedure: LEFT SHOULDER ARTHROSCOPY WITH DEBRIDEMENT AND SUBACROMIAL DECOMPRESSION;  Surgeon: Mcarthur Rossetti, MD;  Location: [place];  Service: Orthopedics;  Laterality: Left;   Patient Active Problem List   Diagnosis Date Noted   Supraventricular tachycardia ([place]) 03/03/2022   Complete tear of left rotator cuff 02/19/2022   Dyspnea on exertion 11/27/2021   Dyslipidemia 11/27/2021   Irregular menses 11/25/2021   Infertility associated with anovulation 11/25/2021   H/O cold sores 11/25/2021   Palpitations 11/17/2021   Impingement syndrome of left shoulder 02/27/2021   Epidermoid cyst 11/04/2020   Candidal vulvovaginitis 11/04/2020   Late prenatal care in third trimester 06/23/2017   Supervision of high risk pregnancy, antepartum 02/15/2017   Advanced maternal age in multigravida, unspecified trimester 02/15/2017   Previous cesarean delivery, antepartum condition or complication [id number]   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 Pt notes she is having [place] area and upper trap pain.  PERTINENT HISTORY: Previous arthroscopic surgery 02/27/21  PAIN:  Are you having pain? Yes NPRS scale: 5/10 Pain location: L shoulder and upper trap Pain orientation: Left  PAIN TYPE: dull,  sharp, and stabbing, heavy Pain description: constant  Aggravating factors: Out of sling for shower Relieving factors: cold packs  PRECAUTIONS: Per Dr Trevor Mace 02/25/22 note. We were only able to repair of the infraspinatus tendon portion of the rotator cuff.  The sutures pulled through the supraspinatus portion and did not have good soft tissue quality.  The left shoulder has pain throughout the arc of motion.  The incision sites have healed well so sutures been removed and Steri-Strips placed.  I did stress the importance of staying in her sling and coming out of it for gentle motion of the shoulder.  We need to set her up for outpatient physical therapy with a therapist going very slow given the tenuous nature of the repair.  I will see her back in 4 weeks to see how she is doing overall.   WEIGHT BEARING RESTRICTIONS Yes NWB  FALLS:  Has patient fallen in last 6 months? 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 Handles light objects  PLOF: Independent  PATIENT GOALS: For my L shoulder to function as close to normal as possible  OBJECTIVE:   DIAGNOSTIC FINDINGS:  IMPRESSION: L shoulder 10/10/21 1. Supraspinatus tendinosis with extensive irregular tear of the anterior and mid tendon with full-thickness components. This has significantly progressed compared to the previous study. Progressive supraspinatus muscle atrophy. 2. Intra-articular biceps tendinosis. 3. Moderate subacromial-subdeltoid bursitis.  PATIENT SURVEYS:  FOTO TBA  COGNITION:  Overall cognitive status: Within functional limits for tasks assessed     SENSATION:  Light touch: Appears intact    POSTURE: Forward head, rounded shoulder  UPPER EXTREMITY PROM:  A/PROM Right 03/05/2022 Left 03/05/2022  Shoulder flexion  120  Shoulder extension    Shoulder abduction  60   Shoulder adduction    Shoulder internal rotation    Shoulder external rotation  10 arm by side  Elbow flexion  WNLS  Elbow extension  WNLS  Wrist flexion  WNLS  Wrist extension  WNLS  Wrist ulnar deviation    Wrist radial deviation    Wrist pronation    Wrist supination    (Blank rows = not tested)  UPPER EXTREMITY MMT: Not assessed MMT Right 03/05/2022 Left 03/05/2022  Shoulder flexion    Shoulder extension    Shoulder abduction    Shoulder adduction    Shoulder internal rotation    Shoulder external rotation    Middle trapezius    Lower trapezius    Elbow flexion    Elbow extension    Wrist flexion    Wrist extension    Wrist ulnar deviation    Wrist radial deviation    Wrist pronation    Wrist supination    Grip strength (lbs)    (Blank rows = not tested)   TODAY'S TREATMENT:  Gentle Flexion-Extension Shoulder Pendulum with Table Support 15x Gentle Horizontal Shoulder Pendulum with Table Support 15x Grip strength with light putty  Pt tolerated pendulum exs reporting no pain with her L shoulder  PATIENT EDUCATION: Education details: Eval findings, POC, HEP, use of cold pack for L GH area pain and use of a heating pad for upper trap pain associated with sling Person educated: Patient and Spouse Education method: Explanation, Demonstration, Tactile cues, Verbal cues, and Handouts Education comprehension: verbalized understanding, returned demonstration, verbal cues required, and tactile cues required   HOME EXERCISE PROGRAM: Access Code: [email or web address] URL: [email or web address] Date: 03/05/2022 Prepared by: Gar Ponto  Exercises Flexion-Extension Shoulder Pendulum with Table Support - 2 x daily - 7 x weekly - 2 sets - 15 reps Horizontal Shoulder Pendulum with Table Support - 2 x daily - 7 x weekly - 2 sets - 15 reps   ASSESSMENT:  CLINICAL IMPRESSION: Patient is a 46 y.o. F who was seen today for physical therapy evaluation and treatment for  s/p L shoulder arthroscopy with rotator cuff repair.   OBJECTIVE IMPAIRMENTS decreased activity tolerance, decreased ROM, decreased strength, impaired UE functional use, and pain.   ACTIVITY LIMITATIONS cleaning, community activity, driving, meal prep, occupation, laundry, yard work, and shopping.   PERSONAL FACTORS Past/current experiences and 1 comorbidity: previous surgery  are also affecting patient's functional outcome.    REHAB POTENTIAL: Good  CLINICAL DECISION MAKING: Evolving/moderate complexity  EVALUATION COMPLEXITY: Moderate   GOALS:  SHORT TERM GOALS:  Pt will be ind in an initail HEP Baseline: strated on eval Target date: 03/26/2022 Goal status: INITIAL  2.  Increase L shoulder PROM to flex 140, ER c arm by side to 40, and abd s rotation to 80d Baseline: 120, 10, 60 respectively Target date: 04/02/2022 Goal status: INITIAL  LONG TERM GOALS:  Pt will demonstrate PROM of the L shoulder 80% of the R Baseline: see flow sheets Target date: 05/08/22 Goal status: INITIAL  2.  Pt will be able to complete AAROM exs indly Baseline:  Target date: 05/08/22 Goal status: INITIAL  3.  Pt will be ind in an advanced HEP Baseline:  Target date: 05/08/22 Goal status: INITIAL  4.  Develop progressive goals for the L shoulder as appropriate per shoulder rotator cuff protocol  Baseline:  Target date: 05/08/22 Goal status: INITIAL  PLAN: PT FREQUENCY: 1x/week for 4 weeks, then 2x/week for 4 weeks  PT DURATION: 8 weeks  PLANNED INTERVENTIONS: Therapeutic exercises, Therapeutic activity, Neuromuscular re-education, Patient/Family education, Joint mobilization, Dry Needling, Electrical stimulation, Cryotherapy, Moist heat, Taping, Vasopneumatic device, Ultrasound, Ionotophoresis 4mg /ml Dexamethasone, and Manual therapy  PLAN FOR NEXT SESSION: Complete FOTO and review; assess response to HEP, assess R shoulder ROM and strength, complete PROM, use of modalities as  indicated   [employer] MS, PT 03/05/22 11:15 PM

## 2022-03-03 NOTE — Patient Instructions (Signed)
Medication Instructions:  °Your physician recommends that you continue on your current medications as directed. Please refer to the Current Medication list given to you today. ° °*If you need a refill on your cardiac medications before your next appointment, please call your pharmacy* ° ° °Lab Work: °None °If you have labs (blood work) drawn today and your tests are completely normal, you will receive your results only by: °MyChart Message (if you have MyChart) OR °A paper copy in the mail °If you have any lab test that is abnormal or we need to change your treatment, we will call you to review the results. ° ° °Follow-Up: °At [facility], you and your health needs are our priority.  As part of our continuing mission to provide you with exceptional heart care, we have created designated Provider Care Teams.  These Care Teams include your primary Cardiologist (physician) and Advanced Practice Providers (APPs -  Physician Assistants and Nurse Practitioners) who all work together to provide you with the care you need, when you need it. ° °We recommend signing up for the patient portal called "MyChart".  Sign up information is provided on this After Visit Summary.  MyChart is used to connect with patients for Virtual Visits (Telemedicine).  Patients are able to view lab/test results, encounter notes, upcoming appointments, etc.  Non-urgent messages can be sent to your provider as well.   °To learn more about what you can do with MyChart, go to [email or web address].   ° °Your next appointment:   °1 year(s) ° °The format for your next appointment:   °In Person ° °Provider:   °Robert Krasowski, MD   °

## 2022-03-03 NOTE — Progress Notes (Signed)
?Cardiology Office Note:   ? ?Date:  03/03/2022  ? ?ID:  Gabriela Le, DOB 03/06/1976, MRN [id number] ? ?PCP:  Gabriela Bass, FNP  ?Cardiologist:  Gabriela Balsam, MD   ? ?Referring MD: Gabriela Le,*  ? ?No chief complaint on file. ?Doing much better ? ?History of Present Illness:   ? ?Gabriela Le is a 46 y.o. female who was referred to [place] because of episode of palpitations as well as dyspnea on exertion.  Quite extensive evaluation has been performed which included monitoring.  When she were monitored with multiple triggered events however does have showing only sinus tachycardia.  She did have however nonsustained supraventricular tachycardia but only short and very few episodes of it.  Asymptomatic.  She comes today for regular follow-up.  She said she discovered what the problem was.  She is to drink a lot of coffee, when she stopped doing it palpitation completely subsided.  She also had echocardiogram done which showed no evidence of of significant pathology.  In the meantime she did have left shoulder surgery.  That happened 2 weeks ago she went to surgery with no difficulty still recovering. ? ?Past Medical History:  ?Diagnosis Date  ? GERD (gastroesophageal reflux disease)   ? H/O cold sores   ? ACYCLOVIR OINTMENT PRN  ? Infertility associated with anovulation   ? HAS TAKEN CLOMID IN THE PAST  ? Irregular menses   ? Missed ab 12/29/2010  ? no surgery required  ? 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 ? ?Past Surgical History:  ?Procedure Laterality Date  ? APPENDECTOMY    ? 6 YOA  ? CESAREAN SECTION  01/06/2013  ? Procedure: CESAREAN SECTION;  Surgeon: Hal Morales, MD;  Location: [facility];  Service: Obstetrics;  Laterality: N/A;  Primary  ? CESAREAN SECTION N/A 10/09/2014  ? Procedure: CESAREAN SECTION;  Surgeon: Konrad Felix, MD;  Location: [facility];  Service: Obstetrics;  Laterality: N/A;  ? CESAREAN SECTION N/A 08/09/2017  ? Procedure: REPEAT CESAREAN SECTION;  Surgeon: Willodean Rosenthal, MD;  Location: [facility];  Service: Obstetrics;  Laterality: N/A;  ? CHOLECYSTECTOMY N/A 11/27/2014  ? Procedure: LAPAROSCOPIC CHOLECYSTECTOMY WITH INTRAOPERATIVE CHOLANGIOGRAM;  Surgeon: Chevis Pretty III, MD;  Location: [facility];  Service: General;  Laterality: N/A;  ? CYSTECTOMY    ? SHOULDER ARTHROSCOPY WITH ROTATOR CUFF REPAIR Left 02/19/2022  ? Procedure: LEFT SHOULDER ARTHROSCOPY WITH ROTATOR CUFF REPAIR;  Surgeon: [name], MD;  Location: [facility];  Service: Orthopedics;  Laterality: Left;  ? SHOULDER ARTHROSCOPY WITH SUBACROMIAL DECOMPRESSION Left 02/27/2021  ? Procedure: LEFT SHOULDER ARTHROSCOPY WITH DEBRIDEMENT AND SUBACROMIAL DECOMPRESSION;  Surgeon: [name], MD;  Location: [facility];  Service: Orthopedics;  Laterality: Left;  ? ? ?Current Medications: ?Current Meds  ?Medication Sig  ? Cetirizine HCl (ZYRTEC ALLERGY) 10 MG CAPS Take by mouth.  ? HYDROcodone-acetaminophen (NORCO) 7.5-325 MG tablet Take 1-2 tablets by mouth every 6 (six) hours as needed for moderate pain.  ? tiZANidine (ZANAFLEX) 4 MG tablet TAKE 1 TABLET(4 MG) BY MOUTH EVERY 8 HOURS AS NEEDED FOR MUSCLE SPASMS  ? vitamin B-12 (CYANOCOBALAMIN) 100 MCG tablet Take 100 mcg by mouth daily.  ? VITAMIN D PO Take 1 tablet by mouth daily.  ?  ? ?Allergies:   Patient has no known allergies.  ? ?Social History  ? ?Socioeconomic History  ? Marital status: Married  ?  Spouse name: Gabriela Le  ? Number of children:  0  ? Years of education: 6  ? Highest education level: Not on file  ?Occupational History  ? Occupation: STUDENT  ?  Comment: [employer]  ?Tobacco Use  ? Smoking status: Never  ? Smokeless tobacco: Never  ?Substance and Sexual Activity  ? Alcohol use: No  ? Drug use: No  ? Sexual activity: Yes  ?  Partners: Male  ?  Birth control/protection: None  ?Other Topics Concern  ? Not on file  ?Social History Narrative  ? Not on file  ? ?Social Determinants of Health   ? ?Financial Resource Strain: Not on file  ?Food Insecurity: Not on file  ?Transportation Needs: Not on file  ?Physical Activity: Not on file  ?Stress: Not on file  ?Social Connections: Not on file  ?  ? ?Family History: ?The patient's family history includes Heart disease in her mother; Hypertension in her mother. ?ROS:   ?Please see the history of present illness.    ?All 14 point review of systems negative except as described per history of present illness ? ?EKGs/Labs/Other Studies Reviewed:   ? ? ? ?Recent Labs: ?11/17/2021: ALT 7; BUN 11; Creatinine, Ser 0.51; Hemoglobin 11.6; Platelets 227.0; Potassium 3.9; Sodium 139; TSH 1.04  ?Recent Lipid Panel ?   ?Component Value Date/Time  ? CHOL 198 09/23/2021 1129  ? TRIG 224.0 (H) 09/23/2021 1129  ? HDL 44.00 09/23/2021 1129  ? CHOLHDL 4 09/23/2021 1129  ? VLDL 44.8 (H) 09/23/2021 1129  ? LDLCALC 134 (H) 08/13/2020 1250  ? LDLDIRECT 128.0 09/23/2021 1129  ? ? ?Physical Exam:   ? ?VS:  BP 116/68   Pulse 73   Ht 5\' 2"  (1.575 m)   Wt 125 lb (56.7 kg)   LMP 02/07/2022   SpO2 99%   BMI 22.86 kg/m?    ? ?Wt Readings from Last 3 Encounters:  ?03/03/22 125 lb (56.7 kg)  ?02/19/22 123 lb 10.9 oz (56.1 kg)  ?11/27/21 126 lb (57.2 kg)  ?  ? ?GEN:  Well nourished, well developed in no acute distress ?HEENT: Normal ?NECK: No JVD; No carotid bruits ?LYMPHATICS: No lymphadenopathy ?CARDIAC: RRR, no murmurs, no rubs, no gallops ?RESPIRATORY:  Clear to auscultation without rales, wheezing or rhonchi  ?ABDOMEN: Soft, non-tender, non-distended ?MUSCULOSKELETAL:  No edema; No deformity  ?SKIN: Warm and dry ?LOWER EXTREMITIES: no swelling ?NEUROLOGIC:  Alert and oriented x 3 ?PSYCHIATRIC:  Normal affect  ? ?ASSESSMENT:   ? ?1. Palpitations   ?2. Dyspnea on exertion   ?3. Dyslipidemia   ?4. Supraventricular tachycardia (HCC)   ? ?PLAN:   ? ?In order of problems listed above: ? ?Palpitations: Monitor show no significant arrhythmia.  She stopped drinking coffee and palpitations  completely subsided.  I encouraged her to stay away from caffeinated drinks.  And I told her if in the future she gets more palpitations small dose of beta-blocker can be used to help with symptomatology. ?Dyspnea on exertion, echocardiogram seems to be good.  Echocardiogram showed ejection fraction being normal.  Continue monitoring. ?Dyslipidemia.  Her LDL 134 HDL 44 not enough to initiate medical therapy yet.  But she needs to stick with good diet and exercise routine. ?Supraventricular tachycardia which is new discovery.  She is completely asymptomatic.  No need to treat ? ? ?Medication Adjustments/Labs and Tests Ordered: ?Current medicines are reviewed at length with the patient today.  Concerns regarding medicines are outlined above.  ?No orders of the defined types were placed in this encounter. ? ?Medication changes: No  orders of the defined types were placed in this encounter. ? ? ?Signed, ?Georgeanna Lea, MD, [facility] ?03/03/2022 3:36 PM    ?[facility] ?

## 2022-03-04 ENCOUNTER — Ambulatory Visit: Payer: 59 | Attending: Orthopaedic Surgery

## 2022-03-04 DIAGNOSIS — G8929 Other chronic pain: Secondary | ICD-10-CM | POA: Diagnosis present

## 2022-03-04 DIAGNOSIS — M6281 Muscle weakness (generalized): Secondary | ICD-10-CM | POA: Diagnosis present

## 2022-03-04 DIAGNOSIS — M25512 Pain in left shoulder: Secondary | ICD-10-CM | POA: Diagnosis present

## 2022-03-04 DIAGNOSIS — M25612 Stiffness of left shoulder, not elsewhere classified: Secondary | ICD-10-CM | POA: Insufficient documentation

## 2022-03-04 DIAGNOSIS — Z9889 Other specified postprocedural states: Secondary | ICD-10-CM | POA: Diagnosis not present

## 2022-03-10 NOTE — Therapy (Signed)
?OUTPATIENT PHYSICAL THERAPY TREATMENT NOTE ? ? ?Patient Name: Gabriela Le ?MRN: [id number] ?DOB:25-Mar-1976, 46 y.o., female ?Today's Date: 03/11/2022 ? ?PCP: Olive Bass, FNP ?REFERRING PROVIDER: [name]* ? ? PT End of Session - 03/11/22 1556   ? ? Visit Number 2   ? Number of Visits 12   ? Date for PT Re-Evaluation 05/08/22   ? Authorization Type [facility] Medicaid   ? Progress Note Due on Visit 10   ? PT Start Time 1553   ? PT Stop Time 1632   ? PT Time Calculation (min) 39 min   ? Activity Tolerance Patient tolerated treatment well   ? Behavior During Therapy [facility] for tasks assessed/performed   ? ?  ?  ? ?  ? ? ?Past Medical History:  ?Diagnosis Date  ? GERD (gastroesophageal reflux disease)   ? H/O cold sores   ? ACYCLOVIR OINTMENT PRN  ? Infertility associated with anovulation   ? HAS TAKEN CLOMID IN THE PAST  ? Irregular menses   ? Missed ab 12/29/2010  ? no surgery required  ? 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 ?Past Surgical History:  ?Procedure Laterality Date  ? APPENDECTOMY    ? 6 YOA  ? CESAREAN SECTION  01/06/2013  ? Procedure: CESAREAN SECTION;  Surgeon: Hal Morales, MD;  Location: [facility];  Service: Obstetrics;  Laterality: N/A;  Primary  ? CESAREAN SECTION N/A 10/09/2014  ? Procedure: CESAREAN SECTION;  Surgeon: Konrad Felix, MD;  Location: [facility];  Service: Obstetrics;  Laterality: N/A;  ? CESAREAN SECTION N/A 08/09/2017  ? Procedure: REPEAT CESAREAN SECTION;  Surgeon: Willodean Rosenthal, MD;  Location: [facility];  Service: Obstetrics;  Laterality: N/A;  ? CHOLECYSTECTOMY N/A 11/27/2014  ? Procedure: LAPAROSCOPIC CHOLECYSTECTOMY WITH INTRAOPERATIVE CHOLANGIOGRAM;  Surgeon: Chevis Pretty III, MD;  Location: [facility];  Service: General;  Laterality: N/A;  ? CYSTECTOMY    ? SHOULDER ARTHROSCOPY WITH ROTATOR CUFF REPAIR Left 02/19/2022  ? Procedure: LEFT SHOULDER ARTHROSCOPY WITH ROTATOR CUFF REPAIR;  Surgeon: [name], MD;  Location: [facility];  Service: Orthopedics;  Laterality: Left;  ? SHOULDER ARTHROSCOPY WITH SUBACROMIAL DECOMPRESSION Left 02/27/2021  ? Procedure: LEFT SHOULDER ARTHROSCOPY WITH DEBRIDEMENT AND SUBACROMIAL DECOMPRESSION;  Surgeon: [name], MD;  Location: [facility];  Service: Orthopedics;  Laterality: Left;  ? ?Patient Active Problem List  ? Diagnosis Date Noted  ? Supraventricular tachycardia (HCC) 03/03/2022  ? Complete tear of left rotator cuff 02/19/2022  ? Dyspnea on exertion 11/27/2021  ? Dyslipidemia 11/27/2021  ? Irregular menses 11/25/2021  ? Infertility associated with anovulation 11/25/2021  ? H/O cold sores 11/25/2021  ? Palpitations 11/17/2021  ? Impingement syndrome of left shoulder 02/27/2021  ? Epidermoid cyst 11/04/2020  ? Candidal vulvovaginitis 11/04/2020  ? Late prenatal care in third trimester 06/23/2017  ? Supervision of high risk pregnancy, antepartum 02/15/2017  ? Advanced maternal age in multigravida, unspecified trimester 02/15/2017  ? Previous cesarean delivery, antepartum condition or complication 10/06/2014  ? 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 Missed ab 12/29/2010  ? ? ?REFERRING DIAG: S/P arthroscopy of left shoulder ? ?THERAPY DIAG:  ?Chronic left shoulder pain ? ?Muscle weakness (generalized) ? ?SUBJECTIVE:                                                                                                                                                                                     ?  ?  SUBJECTIVE STATEMENT: ?Pt reports she has been completing her HEP 2x daily ?  ?PAIN:  ?Are you having pain? Yes ?NPRS scale: 5/10 L GH area; 7/10 upper trap ?Pain location: L GH and upper trap ?Pain orientation: Left  ?PAIN TYPE: dull, sharp, and stabbing, heavy ?Pain description: constant  ?Aggravating factors: Out of sling for shower ?Relieving factors: cold packs ? ?PERTINENT HISTORY: ?Previous arthroscopic surgery  02/27/21 ?  ?PRECAUTIONS: Per Dr Eliberto Ivory 02/25/22 note. We were only able to repair of the infraspinatus tendon portion of the rotator cuff.  The sutures pulled through the supraspinatus portion and did not have good soft tissue quality. ? ?The left shoulder has pain throughout the arc of motion.  The incision sites have healed well so sutures been removed and Steri-Strips placed. ? ?I did stress the importance of staying in her sling and coming out of it for gentle motion of the shoulder.  We need to set her up for outpatient physical therapy with a therapist going very slow given the tenuous nature of the repair.  I will see her back in 4 weeks to see how she is doing overall.  ?  ?WEIGHT BEARING RESTRICTIONS Yes NWB ?  ?FALLS:  ?Has patient fallen in last 6 months? No ?  ?LIVING ENVIRONMENT: ?Lives with: lives with their family ?No issues with accessing home or mobility within home ?  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 Handles light objects ?  ?PLOF: Independent ?  ?PATIENT GOALS: For my L shoulder to function as close to normal as possible ?  ?OBJECTIVE:  ?  ?DIAGNOSTIC FINDINGS:  ?IMPRESSION: L shoulder 10/10/21 ?1. Supraspinatus tendinosis with extensive irregular tear of the ?anterior and mid tendon with full-thickness components. This has ?significantly progressed compared to the previous study. Progressive ?supraspinatus muscle atrophy. ?2. Intra-articular biceps tendinosis. ?3. Moderate subacromial-subdeltoid bursitis. ?  ?PATIENT SURVEYS:  ?FOTO TBA ?  ?COGNITION: ?         Overall cognitive status: Within functional limits for tasks assessed ?                              ?SENSATION: ?         Light touch: Appears intact ?          ?  ?POSTURE: ?Forward head, rounded shoulder ?  ?UPPER EXTREMITY PROM: ?  ?A/PROM Right ?03/05/2022 Left ?03/05/2022  ?Shoulder flexion   120  ?Shoulder extension      ?Shoulder abduction   60  ?Shoulder adduction      ?Shoulder internal rotation       ?Shoulder external rotation   10 arm by side  ?Elbow flexion   WNLS  ?Elbow extension   WNLS  ?Wrist flexion   WNLS  ?Wrist extension   WNLS  ?Wrist ulnar deviation      ?Wrist radial deviation      ?Wrist pronation      ?Wrist supination      ?(Blank rows = not tested) ?  ?UPPER EXTREMITY MMT: ?Not assessed ?MMT Right ?03/05/2022 Left ?03/05/2022  ?Shoulder flexion      ?Shoulder extension      ?Shoulder abduction      ?Shoulder adduction      ?Shoulder internal rotation      ?Shoulder external rotation      ?Middle trapezius      ?Lower trapezius      ?Elbow flexion      ?  Elbow extension      ?Wrist flexion      ?Wrist extension      ?Wrist ulnar deviation      ?Wrist radial deviation      ?Wrist pronation      ?Wrist supination      ?Grip strength (lbs)      ?(Blank rows = not tested) ?           ?TODAY'S TREATMENT:  ?[facility]                                                DATE: 03/11/22 ?Therapeutic Exercise: ?Pendulum flex/ext and add/abd 3x10 each ?Manual Therapy: ?STM and DTM to the upper trap, levator and interscapular muscles. ?PROM to the L scapula in all motions ?PROM for L shoulder flex/ext, ER/IR, and abd/add   ? ?Modalities: ?Moist heat to the upper back, upper trap and levator 15 mins   ? ?Self Care: ?Use of heating pad for upper trap and mid back pain ? ?Eval Treatment: ?Gentle Flexion-Extension Shoulder Pendulum with Table Support 15x ?Gentle Horizontal Shoulder Pendulum with Table Support 15x ?Grip strength with light putty ?  ?Pt tolerated pendulum exs reporting no pain with her L shoulder  ?  ?  ?PATIENT EDUCATION: ?Education details: Eval findings, POC, HEP, use of cold pack for L GH area pain and use of a heating pad for upper trap pain associated with sling ?Person educated: Patient and Spouse ?Education method: Explanation, Demonstration, Tactile cues, Verbal cues, and Handouts ?Education comprehension: verbalized understanding, returned demonstration, verbal cues required, and  tactile cues required ?  ?  ?HOME EXERCISE PROGRAM: ?Access Code: [email or web address] ?URL: [email or web address] ?Date: 03/05/2022 ?Prepared by: Joellyn RuedAllen Randie Tallarico ?  ?Exercises ?Flexion-Extension Shoulder Pendul

## 2022-03-11 ENCOUNTER — Other Ambulatory Visit: Payer: Self-pay

## 2022-03-11 ENCOUNTER — Ambulatory Visit: Payer: 59

## 2022-03-11 DIAGNOSIS — M6281 Muscle weakness (generalized): Secondary | ICD-10-CM

## 2022-03-11 DIAGNOSIS — G8929 Other chronic pain: Secondary | ICD-10-CM

## 2022-03-11 DIAGNOSIS — M25512 Pain in left shoulder: Secondary | ICD-10-CM | POA: Diagnosis not present

## 2022-03-18 ENCOUNTER — Other Ambulatory Visit: Payer: Self-pay

## 2022-03-18 ENCOUNTER — Ambulatory Visit: Payer: 59

## 2022-03-18 DIAGNOSIS — M6281 Muscle weakness (generalized): Secondary | ICD-10-CM

## 2022-03-18 DIAGNOSIS — G8929 Other chronic pain: Secondary | ICD-10-CM

## 2022-03-18 DIAGNOSIS — M25512 Pain in left shoulder: Secondary | ICD-10-CM | POA: Diagnosis not present

## 2022-03-18 NOTE — Therapy (Addendum)
?OUTPATIENT PHYSICAL THERAPY TREATMENT NOTE ? ? ?Patient Name: Gabriela Le ?MRN: [id number] ?DOB:Sep 24, 1976, 46 y.o., female ?Today's Date: 03/18/2022 ? ?PCP: Olive Bass, FNP ?REFERRING PROVIDER: [name]* ? ? PT End of Session - 03/18/22 1621   ? ? Visit Number 3   ? Number of Visits 12   ? Date for PT Re-Evaluation 05/08/22   ? Authorization Type [facility] Medicaid   ? Progress Note Due on Visit 10   ? PT Start Time 1549   ? PT Stop Time 1631   ? PT Time Calculation (min) 42 min   ? Activity Tolerance Patient tolerated treatment well   ? Behavior During Therapy [facility] for tasks assessed/performed   ? ?  ?  ? ?  ? ? ? ?Past Medical History:  ?Diagnosis Date  ? GERD (gastroesophageal reflux disease)   ? H/O cold sores   ? ACYCLOVIR OINTMENT PRN  ? Infertility associated with anovulation   ? HAS TAKEN CLOMID IN THE PAST  ? Irregular menses   ? Missed ab 12/29/2010  ? no surgery required  ? 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 ?Past Surgical History:  ?Procedure Laterality Date  ? APPENDECTOMY    ? 6 YOA  ? CESAREAN SECTION  01/06/2013  ? Procedure: CESAREAN SECTION;  Surgeon: Hal Morales, MD;  Location: [facility];  Service: Obstetrics;  Laterality: N/A;  Primary  ? CESAREAN SECTION N/A 10/09/2014  ? Procedure: CESAREAN SECTION;  Surgeon: Konrad Felix, MD;  Location: [facility];  Service: Obstetrics;  Laterality: N/A;  ? CESAREAN SECTION N/A 08/09/2017  ? Procedure: REPEAT CESAREAN SECTION;  Surgeon: Willodean Rosenthal, MD;  Location: [facility];  Service: Obstetrics;  Laterality: N/A;  ? CHOLECYSTECTOMY N/A 11/27/2014  ? Procedure: LAPAROSCOPIC CHOLECYSTECTOMY WITH INTRAOPERATIVE CHOLANGIOGRAM;  Surgeon: Chevis Pretty III, MD;  Location: [facility];  Service: General;  Laterality: N/A;  ? CYSTECTOMY    ? SHOULDER ARTHROSCOPY WITH ROTATOR CUFF REPAIR Left 02/19/2022  ? Procedure: LEFT SHOULDER ARTHROSCOPY WITH ROTATOR CUFF REPAIR;  Surgeon: [name], MD;  Location: [facility];  Service: Orthopedics;  Laterality: Left;  ? SHOULDER ARTHROSCOPY WITH SUBACROMIAL DECOMPRESSION Left 02/27/2021  ? Procedure: LEFT SHOULDER ARTHROSCOPY WITH DEBRIDEMENT AND SUBACROMIAL DECOMPRESSION;  Surgeon: [name], MD;  Location: [facility];  Service: Orthopedics;  Laterality: Left;  ? ?Patient Active Problem List  ? Diagnosis Date Noted  ? Supraventricular tachycardia (HCC) 03/03/2022  ? Complete tear of left rotator cuff 02/19/2022  ? Dyspnea on exertion 11/27/2021  ? Dyslipidemia 11/27/2021  ? Irregular menses 11/25/2021  ? Infertility associated with anovulation 11/25/2021  ? H/O cold sores 11/25/2021  ? Palpitations 11/17/2021  ? Impingement syndrome of left shoulder 02/27/2021  ? Epidermoid cyst 11/04/2020  ? Candidal vulvovaginitis 11/04/2020  ? Late prenatal care in third trimester 06/23/2017  ? Supervision of high risk pregnancy, antepartum 02/15/2017  ? Advanced maternal age in multigravida, unspecified trimester 02/15/2017  ? Previous cesarean delivery, antepartum condition or complication 10/06/2014  ? 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 Missed ab 12/29/2010  ? ? ?REFERRING DIAG: S/P arthroscopy of left shoulder ? ?THERAPY DIAG:  ?Chronic left shoulder pain ? ?Muscle weakness (generalized) ? ?SUBJECTIVE:                                                                                                                                                                                     ?  ?  SUBJECTIVE STATEMENT: ?Pt reports lateral L shoulder pain with UE movements outside the sling with preparing meals. Pt notes she is not experiencing R upper trap pain. ?  ?PAIN:  ?Are you having pain? Yes ?NPRS scale: 5/10 L GH area; 0/10 upper trap- tight ?Pain location: L GH and upper trap ?Pain orientation: Left  ?PAIN TYPE: dull, sharp, and stabbing, heavy ?Pain description: constant  ?Aggravating factors: Out of  sling for shower ?Relieving factors: cold packs ? ?PERTINENT HISTORY: ?Previous arthroscopic surgery 02/27/21 ?  ?PRECAUTIONS: Per Dr Eliberto Ivory 02/25/22 note. We were only able to repair of the infraspinatus tendon portion of the rotator cuff.  The sutures pulled through the supraspinatus portion and did not have good soft tissue quality. ? ?The left shoulder has pain throughout the arc of motion.  The incision sites have healed well so sutures been removed and Steri-Strips placed. ? ?I did stress the importance of staying in her sling and coming out of it for gentle motion of the shoulder.  We need to set her up for outpatient physical therapy with a therapist going very slow given the tenuous nature of the repair.  I will see her back in 4 weeks to see how she is doing overall.  ?  ?WEIGHT BEARING RESTRICTIONS Yes NWB ?  ?FALLS:  ?Has patient fallen in last 6 months? No ?  ?LIVING ENVIRONMENT: ?Lives with: lives with their family ?No issues with accessing home or mobility within home ?  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 Handles light objects ?  ?PLOF: Independent ?  ?PATIENT GOALS: For my L shoulder to function as close to normal as possible ?  ?OBJECTIVE:  ?  ?DIAGNOSTIC FINDINGS:  ?IMPRESSION: L shoulder 10/10/21 ?1. Supraspinatus tendinosis with extensive irregular tear of the ?anterior and mid tendon with full-thickness components. This has ?significantly progressed compared to the previous study. Progressive ?supraspinatus muscle atrophy. ?2. Intra-articular biceps tendinosis. ?3. Moderate subacromial-subdeltoid bursitis. ?  ?PATIENT SURVEYS:  ?FOTO TBA ?  ?COGNITION: ?         Overall cognitive status: Within functional limits for tasks assessed ?                              ?SENSATION: ?         Light touch: Appears intact ?          ?  ?POSTURE: ?Forward head, rounded shoulder ?  ?UPPER EXTREMITY PROM: ?  ?A/PROM Right ?03/05/2022 Left ?03/05/2022 Left ?03/18/22  ?Shoulder flexion    120 140 tight end feel  ?Shoulder extension       ?Shoulder abduction   60 80 empty end feel  ?Shoulder adduction       ?Shoulder internal rotation       ?Shoulder external rotation   10 arm by side 40 c arm by side. Tight end feel c discomfort  ?Elbow flexion   WNLS   ?Elbow extension   WNLS   ?Wrist flexion   WNLS   ?Wrist extension   WNLS   ?Wrist ulnar deviation       ?Wrist radial deviation       ?Wrist pronation       ?Wrist supination       ?(Blank rows = not tested) ?  ?UPPER EXTREMITY MMT: ?Not assessed ?MMT Right ?03/05/2022 Left ?03/05/2022  ?Shoulder flexion      ?Shoulder extension      ?  Shoulder abduction      ?Shoulder adduction      ?Shoulder internal rotation      ?Shoulder external rotation      ?Middle trapezius      ?Lower trapezius      ?Elbow flexion      ?Elbow extension      ?Wrist flexion      ?Wrist extension      ?Wrist ulnar deviation      ?Wrist radial deviation      ?Wrist pronation      ?Wrist supination      ?Grip strength (lbs)      ?(Blank rows = not tested) ?           ?[facility]S TREATMENT:  [facility]                                                [facility]: 03/18/22 ?Therapeutic Exercise: ?Pendulum flex/ext and add/abd 3x10 each ?PROM to the L scapula in all motions ?PROM for L shoulder flex/ext, ER/IR, and abd/add   ? ?Self Care: ?Pt was advised to wear the L arm sling at all times except when showering to protect her L shoulder surgery ? ? ?[facility]                                                [facility]: 03/11/22 ?Therapeutic Exercise: ?Pendulum flex/ext and add/abd 3x10 each ?Manual Therapy: ?STM and DTM to the upper trap, levator and interscapular muscles. ?PROM to the L scapula in all motions ?PROM for L shoulder flex/ext, ER/IR, and abd/add   ? ?Modalities: ?Moist heat to the upper back, upper trap and levator 15 mins   ? ?Self Care: ?Use of heating pad for upper trap and mid back pain ? ?Eval Treatment: ?Gentle Flexion-Extension Shoulder Pendulum with Table  Support 15x ?Gentle Horizontal Shoulder Pendulum with Table Support 15x ?Grip strength with light putty ?  ?Pt tolerated pendulum exs reporting no pain with her L shoulder  ?  ?  ?PATIENT EDUCATION: ?Educ

## 2022-03-25 ENCOUNTER — Encounter: Payer: Self-pay | Admitting: Orthopaedic Surgery

## 2022-03-25 ENCOUNTER — Ambulatory Visit (INDEPENDENT_AMBULATORY_CARE_PROVIDER_SITE_OTHER): Payer: 59

## 2022-03-25 ENCOUNTER — Ambulatory Visit (INDEPENDENT_AMBULATORY_CARE_PROVIDER_SITE_OTHER): Payer: 59 | Admitting: Orthopaedic Surgery

## 2022-03-25 ENCOUNTER — Other Ambulatory Visit: Payer: Self-pay

## 2022-03-25 ENCOUNTER — Ambulatory Visit: Payer: 59

## 2022-03-25 DIAGNOSIS — M25512 Pain in left shoulder: Secondary | ICD-10-CM | POA: Diagnosis not present

## 2022-03-25 DIAGNOSIS — M25561 Pain in right knee: Secondary | ICD-10-CM

## 2022-03-25 DIAGNOSIS — M6281 Muscle weakness (generalized): Secondary | ICD-10-CM

## 2022-03-25 DIAGNOSIS — G8929 Other chronic pain: Secondary | ICD-10-CM

## 2022-03-25 DIAGNOSIS — M25612 Stiffness of left shoulder, not elsewhere classified: Secondary | ICD-10-CM

## 2022-03-25 NOTE — Progress Notes (Signed)
The patient is status post a left shoulder rotator cuff repair that we did several weeks ago.  This was a tenuous repair and we could only partial repair of the rotator cuff.  She does report that she is doing well in physical therapy and attending physical therapy.  Her pain is less.  She has been experiencing 3 weeks of right knee pain with no known injury.  She points to the lateral aspect of her knee and just posterior lateral source of her pain.  There is been no locking catching. ? ?She has full range of motion of her right knee.  It is ligamentously stable.  There is no effusion.  Her pain seems to be around the IT band. ? ?There is still significant weakness with her left shoulder and some pain but slightly improved motion. ? ?2 views of the right knee are normal.  There is negative findings in terms of normal alignment and well-maintained joint space. ? ?We will have physical therapy work on both her right knee and her left shoulder.  I gave a prescription reflecting that.  We will see her back in 4 weeks to see how she is doing overall. ?

## 2022-03-25 NOTE — Therapy (Signed)
?OUTPATIENT PHYSICAL THERAPY TREATMENT NOTE ? ? ?Patient Name: Gabriela Le ?MRN: [id number] ?DOB:January 05, 1976, 46 y.o., female ?Today's Date: 03/25/2022 ? ?PCP: Olive Bass, FNP ?REFERRING PROVIDER: [name]* ? ? PT End of Session - 03/25/22 1550   ? ? Visit Number 4   ? Number of Visits 12   ? Date for PT Re-Evaluation 05/08/22   ? Authorization Type [facility] Medicaid   ? Progress Note Due on Visit 10   ? PT Start Time 1549   ? PT Stop Time 1630   ? PT Time Calculation (min) 41 min   ? Activity Tolerance Patient tolerated treatment well   ? Behavior During Therapy [facility] for tasks assessed/performed   ? ?  ?  ? ?  ? ? ? ? ?Past Medical History:  ?Diagnosis Date  ? GERD (gastroesophageal reflux disease)   ? H/O cold sores   ? ACYCLOVIR OINTMENT PRN  ? Infertility associated with anovulation   ? HAS TAKEN CLOMID IN THE PAST  ? Irregular menses   ? Missed ab 12/29/2010  ? no surgery required  ? 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 ?Past Surgical History:  ?Procedure Laterality Date  ? APPENDECTOMY    ? 6 YOA  ? CESAREAN SECTION  01/06/2013  ? Procedure: CESAREAN SECTION;  Surgeon: Hal Morales, MD;  Location: [facility];  Service: Obstetrics;  Laterality: N/A;  Primary  ? CESAREAN SECTION N/A 10/09/2014  ? Procedure: CESAREAN SECTION;  Surgeon: Konrad Felix, MD;  Location: [facility];  Service: Obstetrics;  Laterality: N/A;  ? CESAREAN SECTION N/A 08/09/2017  ? Procedure: REPEAT CESAREAN SECTION;  Surgeon: Willodean Rosenthal, MD;  Location: [facility];  Service: Obstetrics;  Laterality: N/A;  ? CHOLECYSTECTOMY N/A 11/27/2014  ? Procedure: LAPAROSCOPIC CHOLECYSTECTOMY WITH INTRAOPERATIVE CHOLANGIOGRAM;  Surgeon: Chevis Pretty III, MD;  Location: [facility];  Service: General;  Laterality: N/A;  ? CYSTECTOMY    ? SHOULDER ARTHROSCOPY WITH ROTATOR CUFF REPAIR Left 02/19/2022  ? Procedure: LEFT SHOULDER ARTHROSCOPY WITH ROTATOR CUFF REPAIR;  Surgeon: [name], MD;  Location: [facility];  Service: Orthopedics;  Laterality: Left;  ? SHOULDER ARTHROSCOPY WITH SUBACROMIAL DECOMPRESSION Left 02/27/2021  ? Procedure: LEFT SHOULDER ARTHROSCOPY WITH DEBRIDEMENT AND SUBACROMIAL DECOMPRESSION;  Surgeon: [name], MD;  Location: [facility];  Service: Orthopedics;  Laterality: Left;  ? ?Patient Active Problem List  ? Diagnosis Date Noted  ? Supraventricular tachycardia (HCC) 03/03/2022  ? Complete tear of left rotator cuff 02/19/2022  ? Dyspnea on exertion 11/27/2021  ? Dyslipidemia 11/27/2021  ? Irregular menses 11/25/2021  ? Infertility associated with anovulation 11/25/2021  ? H/O cold sores 11/25/2021  ? Palpitations 11/17/2021  ? Impingement syndrome of left shoulder 02/27/2021  ? Epidermoid cyst 11/04/2020  ? Candidal vulvovaginitis 11/04/2020  ? Late prenatal care in third trimester 06/23/2017  ? Supervision of high risk pregnancy, antepartum 02/15/2017  ? Advanced maternal age in multigravida, unspecified trimester 02/15/2017  ? Previous cesarean delivery, antepartum condition or complication 10/06/2014  ? 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 Missed ab 12/29/2010  ? ? ?REFERRING DIAG: S/P arthroscopy of left shoulder ? ?THERAPY DIAG:  ?Chronic left shoulder pain ? ?Muscle weakness (generalized) ? ?Stiffness of left shoulder, not elsewhere classified ? ?SUBJECTIVE:                                                                                                                                                                                     ?  ?  SUBJECTIVE STATEMENT: ?Pt reports her L shoulder is doing better less painful ?  ?PAIN:  ?Are you having pain? Yes ?NPRS scale: 3/10 L GH area; 0/10 upper trap- tight ?Pain location: L GH and upper trap ?Pain orientation: Left  ?PAIN TYPE: dull, sharp, and stabbing, heavy ?Pain description: constant  ?Aggravating factors: Out of sling for shower ?Relieving factors:  cold packs ? ?PERTINENT HISTORY: ?Previous arthroscopic surgery 02/27/21 ?  ?PRECAUTIONS:  ?Sling DCed on 03/25/22/ following appt c Dr. Magnus IvanBlackman ?  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 Handles light objects ?  ?PATIENT GOALS: For my L shoulder to function as close to normal as possible ?  ?OBJECTIVE:  ?  ?DIAGNOSTIC FINDINGS:  ?IMPRESSION: L shoulder 10/10/21 ?1. Supraspinatus tendinosis with extensive irregular tear of the ?anterior and mid tendon with full-thickness components. This has ?significantly progressed compared to the previous study. Progressive ?supraspinatus muscle atrophy. ?2. Intra-articular biceps tendinosis. ?3. Moderate subacromial-subdeltoid bursitis. ?  ?PATIENT SURVEYS:  ?FOTO TBA ?  ?COGNITION: ?         Overall cognitive status: Within functional limits for tasks assessed ?                              ?SENSATION: ?         Light touch: Appears intact ?          ?  ?POSTURE: ?Forward head, rounded shoulder ?  ?UPPER EXTREMITY PROM: ?  ?A/PROM Right ?03/05/2022 Left ?03/05/2022 Left ?03/18/22  ?Shoulder flexion   120 140 tight end feel  ?Shoulder extension       ?Shoulder abduction   60 80 empty end feel  ?Shoulder adduction       ?Shoulder internal rotation       ?Shoulder external rotation   10 arm by side 40 c arm by side. Tight end feel c discomfort  ?Elbow flexion   WNLS   ?Elbow extension   WNLS   ?Wrist flexion   WNLS   ?Wrist extension   WNLS   ?Wrist ulnar deviation       ?Wrist radial deviation       ?Wrist pronation       ?Wrist supination       ?(Blank rows = not tested) ?  ?UPPER EXTREMITY MMT: ?Not assessed ?MMT Right ?03/05/2022 Left ?03/05/2022  ?Shoulder flexion      ?Shoulder extension      ?Shoulder abduction      ?Shoulder adduction      ?Shoulder internal rotation      ?Shoulder external rotation      ?Middle trapezius      ?Lower trapezius      ?Elbow flexion      ?Elbow extension      ?Wrist flexion      ?Wrist extension      ?Wrist ulnar deviation       ?Wrist radial deviation      ?Wrist pronation      ?Wrist supination      ?Grip strength (lbs)      ?(Blank rows = not tested) ?           ?TODAY'S TREATMENT:  ?[facility] Adult PT Treatment:  DATE: 03/25/22 ?Therapeutic Exercise: ?Supine AAROM wand exs for shoulder flexion, abduction and ER x10 5" ?Supine bilat arm presses into mat table x10 5 sec ?Manual Therapy: ?PROM to the L shoulder for flexion, abduction, and ER as tolerated ? ? ?[facility] Adult PT Treatment:                                                DATE: 03/18/22 ?Therapeutic Exercise: ?Pendulum flex/ext and add/abd 3x10 each ?PROM to the L scapula in all motions ?PROM for L shoulder flex/ext, ER/IR, and abd/add   ? ?Self Care: ?Pt was advised to wear the L arm sling at all times except when showering to protect her L shoulder surgery ? ? ?[facility] Adult PT Treatment:                                                DATE: 03/11/22 ?Therapeutic Exercise: ?Pendulum flex/ext and add/abd 3x10 each ?Manual Therapy: ?STM and DTM to the upper trap, levator and interscapular muscles. ?PROM to the L scapula in all motions ?PROM for L shoulder flex/ext, ER/IR, and abd/add   ? ?Modalities: ?Moist heat to the upper back, upper trap and levator 15 mins   ? ?Self Care: ?Use of heating pad for upper trap and mid back pain ?  ?  ?PATIENT EDUCATION: ?Education details: Eval findings, POC, HEP, use of cold pack for L GH area pain and use of a heating pad for upper trap pain associated with sling ?Person educated: Patient and Spouse ?Education method: Explanation, Demonstration, Tactile cues, Verbal cues, and Handouts ?Education comprehension: verbalized understanding, returned demonstration, verbal cues required, and tactile cues required ?  ?  ?HOME EXERCISE PROGRAM: ?Access Code: [email or web address] ?URL: [email or web address] ?Date: 03/05/2022 ?Prepared by: Joellyn Rued ?  ?Exercises ?Flexion-Extension Shoulder Pendulum with Table Support  - 2 x daily - 7 x weekly - 2 sets - 15 reps ?Horizontal Shoulder Pendulum with Table Support - 2 x daily - 7 x weekly - 2 sets - 15 reps ?  ?  ?ASSESSMENT: ?  ?CLINICAL IMPRESSION: ?Pt returns to PT f

## 2022-04-01 ENCOUNTER — Ambulatory Visit: Payer: 59

## 2022-04-01 ENCOUNTER — Ambulatory Visit: Payer: 59 | Admitting: Dermatology

## 2022-04-01 NOTE — Therapy (Signed)
?OUTPATIENT PHYSICAL THERAPY TREATMENT NOTE/R knee Cert ? ? ?Patient Name: Gabriela Le ?MRN: [id number] ?DOB:[birth year], 46 y.o., female ?Today's Date: 04/02/2022 ? ?PCP: Olive Bass, FNP ?REFERRING PROVIDER: [name]* ? ? PT End of Session - 04/02/22 0855   ? ? Visit Number 5   ? Number of Visits 12   ? Date for PT Re-Evaluation 05/08/22   ? Authorization Type [facility] Medicaid   ? Progress Note Due on Visit 10   ? PT Start Time [phone]   ? PT Stop Time 0936   ? PT Time Calculation (min) 46 min   ? Activity Tolerance Patient tolerated treatment well   ? Behavior During Therapy [facility] for tasks assessed/performed   ? ?  ?  ? ?  ? ? ? ? ? ?Past Medical History:  ?Diagnosis Date  ? GERD (gastroesophageal reflux disease)   ? H/O cold sores   ? ACYCLOVIR OINTMENT PRN  ? Infertility associated with anovulation   ? HAS TAKEN CLOMID IN THE PAST  ? Irregular menses   ? Missed ab 12/29/2010  ? no surgery required  ? 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 ?Past Surgical History:  ?Procedure Laterality Date  ? APPENDECTOMY    ? 6 YOA  ? CESAREAN SECTION  01/06/2013  ? Procedure: CESAREAN SECTION;  Surgeon: Hal Morales, MD;  Location: [facility];  Service: Obstetrics;  Laterality: N/A;  Primary  ? CESAREAN SECTION N/A 10/09/2014  ? Procedure: CESAREAN SECTION;  Surgeon: Konrad Felix, MD;  Location: [facility];  Service: Obstetrics;  Laterality: N/A;  ? CESAREAN SECTION N/A 08/09/2017  ? Procedure: REPEAT CESAREAN SECTION;  Surgeon: Willodean Rosenthal, MD;  Location: [facility];  Service: Obstetrics;  Laterality: N/A;  ? CHOLECYSTECTOMY N/A 11/27/2014  ? Procedure: LAPAROSCOPIC CHOLECYSTECTOMY WITH INTRAOPERATIVE CHOLANGIOGRAM;  Surgeon: Chevis Pretty III, MD;  Location: [facility];  Service: General;  Laterality: N/A;  ? CYSTECTOMY    ? SHOULDER ARTHROSCOPY WITH ROTATOR CUFF REPAIR Left 02/19/2022  ? Procedure: LEFT SHOULDER ARTHROSCOPY WITH ROTATOR CUFF REPAIR;  Surgeon: [name], MD;   Location: [facility];  Service: Orthopedics;  Laterality: Left;  ? SHOULDER ARTHROSCOPY WITH SUBACROMIAL DECOMPRESSION Left 02/27/2021  ? Procedure: LEFT SHOULDER ARTHROSCOPY WITH DEBRIDEMENT AND SUBACROMIAL DECOMPRESSION;  Surgeon: [name], MD;  Location: [facility];  Service: Orthopedics;  Laterality: Left;  ? ?Patient Active Problem List  ? Diagnosis Date Noted  ? Supraventricular tachycardia (HCC) 03/03/2022  ? Complete tear of left rotator cuff 02/19/2022  ? Dyspnea on exertion 11/27/2021  ? Dyslipidemia 11/27/2021  ? Irregular menses 11/25/2021  ? Infertility associated with anovulation 11/25/2021  ? H/O cold sores 11/25/2021  ? Palpitations 11/17/2021  ? Impingement syndrome of left shoulder 02/27/2021  ? Epidermoid cyst 11/04/2020  ? Candidal vulvovaginitis 11/04/2020  ? Late prenatal care in third trimester 06/23/2017  ? Supervision of high risk pregnancy, antepartum 02/15/2017  ? Advanced maternal age in multigravida, unspecified trimester 02/15/2017  ? Previous cesarean delivery, antepartum condition or complication 10/06/2014  ? 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 Missed ab 12/29/2010  ? ? ?REFERRING DIAG: S/P arthroscopy of left shoulder; R knee IT band pain ? ?THERAPY DIAG:  ?Chronic left shoulder pain - Plan: PT plan of care cert/re-cert ? ?Muscle weakness (generalized) - Plan: PT plan of care cert/re-cert ? ?Stiffness of left shoulder, not elsewhere classified - Plan: PT plan  of care cert/re-cert ? ?Chronic pain of right knee - Plan: PT plan of care cert/re-cert ? ?SUBJECTIVE:                                                                                                                                                                                     ?  ?SUBJECTIVE STATEMENT: ?Pt reports her L shoulder is better and not hurting all the time. Pt reports difficulty completing the AAROm ex for ER. Pt  reports intermittent R knee pain in the area of the post/lat knee. ?  ?PAIN:  ?Are you having pain? Yes ?NPRS scale: L shoulder- 0/10 L GH area; worst:7/10 brief. R knee 5/10 ?Pain location: L GH and upper trap ?Pain orientation: Left  ?PAIN TYPE: dull, sharp, and stabbing, heavy ?Pain description: constant  ?Aggravating factors: Out of sling for shower ?Relieving factors: cold packs ? ?PERTINENT HISTORY: ?Previous arthroscopic surgery 02/27/21 ?  ?PRECAUTIONS:  ?Sling DCed on 03/25/22/ following appt c Dr. Magnus IvanBlackman ?  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 Handles light objects ?  ?PATIENT GOALS: For my L shoulder to function as close to normal as possible ?  ?OBJECTIVE:  ?  ?DIAGNOSTIC FINDINGS:  ?IMPRESSION: L shoulder 10/10/21 ?1. Supraspinatus tendinosis with extensive irregular tear of the ?anterior and mid tendon with full-thickness components. This has ?significantly progressed compared to the previous study. Progressive ?supraspinatus muscle atrophy. ?2. Intra-articular biceps tendinosis. ?3. Moderate subacromial-subdeltoid bursitis. ?  ?PATIENT SURVEYS:  ?FOTO TBA ?  ?COGNITION: ?         Overall cognitive status: Within functional limits for tasks assessed ?                              ?SENSATION: ?         Light touch: Appears intact ?          ?  ?POSTURE: ?Forward head, rounded shoulder ?  ?UPPER EXTREMITY PROM: ?  ?A/PROM Right ?03/05/2022 Left ?03/05/2022 Left ?03/18/22  ?Shoulder flexion   120 140 tight end feel  ?Shoulder extension       ?Shoulder abduction   60 80 empty end feel  ?Shoulder adduction       ?Shoulder internal rotation       ?Shoulder external rotation   10 arm by side 40 c arm by side. Tight end feel c discomfort  ?Elbow flexion   WNLS   ?Elbow extension   WNLS   ?Wrist flexion   WNLS   ?Wrist extension   WNLS   ?Wrist ulnar deviation       ?  Wrist radial deviation       ?Wrist pronation       ?Wrist supination       ?(Blank rows = not tested) ?  ?UPPER EXTREMITY  MMT: ?Not assessed ?MMT Right ?03/05/2022 Left ?03/05/2022  ?Shoulder flexion      ?Shoulder extension      ?Shoulder abduction      ?Shoulder adduction      ?Shoulder internal rotation      ?Shoulder external rotation      ?Middle trapezius      ?Lower trapezius      ?Elbow flexion      ?Elbow extension      ?Wrist flexion      ?Wrist extension      ?Wrist ulnar deviation      ?Wrist radial deviation      ?Wrist pronation      ?Wrist supination      ?Grip strength (lbs)      ?(Blank rows = not tested) ? ?04/02/22 R Knee Assessment : ?TTP post lat R knee in area of hamstring insertion ?R Knee strength 5/5, hip strength 4-/5 (L 4+/5) ?AROM of the R knee is WNLs ? ?           ?TODAY'S TREATMENT:  ?[facility] Adult PT Treatment:                                                DATE: 04/02/22 ?Therapeutic Exercise: ?Supine AAROM wand exs for shoulder flexion and ER x10 5" ( pt had difficulty with abd and it was Dced for the wand) ?Seated shoulder AAROM on table top for flex, abd, ER x10 5" ?Supine bilat arm presses into mat table x10 5 sec ?Bridging 10x ?R hip SLR 10x ?R hip abd 10x ?Supine hip abd 10x GTB ?HEP updated ? ?[facility] Adult PT Treatment:                                                DATE: 03/25/22 ?Therapeutic Exercise: ?Supine AAROM wand exs for shoulder flexion, abduction and ER x10 5" ?Supine bilat arm presses into mat table x10 5 sec ?Manual Therapy: ?PROM to the L shoulder for flexion, abduction, and ER as tolerated ? ? ?[facility] Adult PT Treatment:                                                DATE: 03/18/22 ?Therapeutic Exercise: ?Pendulum flex/ext and add/abd 3x10 each ?PROM to the L scapula in all motions ?PROM for L shoulder flex/ext, ER/IR, and abd/add   ? ?Self Care: ?Pt was advised to wear the L arm sling at all times except when showering to protect her L shoulder surgery ?  ?  ?PATIENT EDUCATION: ?Education details: Eval findings, POC, HEP, use of cold pack for L GH area pain and use of a heating pad for upper trap  pain associated with sling ?Person educated: Patient and Spouse ?Education method: Explanation, Demonstration, Tactile cues, Verbal cues, and Handouts ?Education comprehension: verbalized understanding, returned

## 2022-04-02 ENCOUNTER — Ambulatory Visit: Payer: 59 | Attending: Orthopaedic Surgery

## 2022-04-02 DIAGNOSIS — M25561 Pain in right knee: Secondary | ICD-10-CM | POA: Insufficient documentation

## 2022-04-02 DIAGNOSIS — M25512 Pain in left shoulder: Secondary | ICD-10-CM | POA: Insufficient documentation

## 2022-04-02 DIAGNOSIS — G8929 Other chronic pain: Secondary | ICD-10-CM | POA: Insufficient documentation

## 2022-04-02 DIAGNOSIS — M25612 Stiffness of left shoulder, not elsewhere classified: Secondary | ICD-10-CM | POA: Diagnosis present

## 2022-04-02 DIAGNOSIS — M6281 Muscle weakness (generalized): Secondary | ICD-10-CM | POA: Diagnosis present

## 2022-04-07 ENCOUNTER — Ambulatory Visit (INDEPENDENT_AMBULATORY_CARE_PROVIDER_SITE_OTHER): Payer: 59 | Admitting: Dermatology

## 2022-04-07 ENCOUNTER — Encounter: Payer: Self-pay | Admitting: Dermatology

## 2022-04-07 DIAGNOSIS — L659 Nonscarring hair loss, unspecified: Secondary | ICD-10-CM

## 2022-04-07 DIAGNOSIS — L639 Alopecia areata, unspecified: Secondary | ICD-10-CM

## 2022-04-07 MED ORDER — CLOBETASOL PROPIONATE 0.05 % EX FOAM: Freq: Two times a day (BID) | CUTANEOUS | 5 refills | Status: DC

## 2022-04-07 NOTE — Patient Instructions (Addendum)
Call Dr. Darreld Mclean at [facility] [phone] ?

## 2022-04-07 NOTE — Therapy (Signed)
?OUTPATIENT PHYSICAL THERAPY TREATMENT NOTE/R knee Cert ? ? ?Patient Name: Gabriela BandaMona O Le ?MRN: [id number] ?DOB:07/27/1976, 46 y.o., female ?Today's Date: 04/09/2022 ? ?PCP: Olive BassMurray, Laura Woodruff, FNP ?REFERRING PROVIDER: Kathryne HitchBlackman, Christopher Y* ? ? PT End of Session - 04/08/22 1034   ? ? Visit Number 6   ? Number of Visits 12   ? Date for PT Re-Evaluation 05/08/22   ? Authorization Type [employer] [facility] [employer] [employer]   ? Progress Note Due on Visit 10   ? PT Start Time 1022   ? PT Stop Time 1105   ? PT Time Calculation (min) 43 min   ? Activity Tolerance Patient tolerated treatment well   ? Behavior During [facility] for tasks assessed/performed   ? ?  ?  ? ?  ? ? ? ? ? ? ?Past Medical History:  ?Diagnosis Date  ? GERD (gastroesophageal reflux disease)   ? H/O cold sores   ? ACYCLOVIR OINTMENT PRN  ? Infertility associated with anovulation   ? HAS TAKEN CLOMID IN THE PAST  ? Irregular menses   ? Missed ab 12/29/2010  ? no surgery required  ? 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 ?Past Surgical History:  ?Procedure Laterality Date  ? APPENDECTOMY    ? 6 YOA  ? CESAREAN SECTION  01/06/2013  ? Procedure: CESAREAN SECTION;  Surgeon: Hal MoralesVanessa P Haygood, MD;  Location: [facility];  Service: Obstetrics;  Laterality: N/A;  Primary  ? CESAREAN SECTION N/A 10/09/2014  ? Procedure: CESAREAN SECTION;  Surgeon: Konrad FelixEma Wakuru Kulwa, MD;  Location: [facility];  Service: Obstetrics;  Laterality: N/A;  ? CESAREAN SECTION N/A 08/09/2017  ? Procedure: REPEAT CESAREAN SECTION;  Surgeon: Willodean RosenthalHarraway-Smith, Carolyn, MD;  [facility];  Service: Obstetrics;  Laterality: N/A;  ? CHOLECYSTECTOMY N/A 11/27/2014  ? Procedure: LAPAROSCOPIC CHOLECYSTECTOMY WITH INTRAOPERATIVE CHOLANGIOGRAM;  Surgeon: Chevis PrettyPaul Toth III, MD;  Location: [facility];  Service: General;  Laterality: N/A;  ? CYSTECTOMY    ? SHOULDER ARTHROSCOPY WITH ROTATOR CUFF REPAIR Left 02/19/2022  ? Procedure: LEFT SHOULDER ARTHROSCOPY WITH ROTATOR CUFF REPAIR;  Surgeon: Kathryne HitchBlackman, Christopher Y, MD;   Location: [facility];  Service: Orthopedics;  Laterality: Left;  ? SHOULDER ARTHROSCOPY WITH SUBACROMIAL DECOMPRESSION Left 02/27/2021  ? Procedure: LEFT SHOULDER ARTHROSCOPY WITH DEBRIDEMENT AND SUBACROMIAL DECOMPRESSION;  Surgeon: Kathryne HitchBlackman, Christopher Y, MD;  Location: [facility];  Service: Orthopedics;  Laterality: Left;  ? ?Patient Active Problem List  ? Diagnosis Date Noted  ? Supraventricular tachycardia (HCC) 03/03/2022  ? Complete tear of left rotator cuff 02/19/2022  ? Dyspnea on exertion 11/27/2021  ? Dyslipidemia 11/27/2021  ? Irregular menses 11/25/2021  ? Infertility associated with anovulation 11/25/2021  ? H/O cold sores 11/25/2021  ? Palpitations 11/17/2021  ? Impingement syndrome of left shoulder 02/27/2021  ? Epidermoid cyst 11/04/2020  ? Candidal vulvovaginitis 11/04/2020  ? Late prenatal care in third trimester 06/23/2017  ? Supervision of high risk pregnancy, antepartum 02/15/2017  ? Advanced maternal age in multigravida, unspecified trimester 02/15/2017  ? Previous cesarean delivery, antepartum condition or complication 10/06/2014  ? 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 Missed ab 12/29/2010  ? ? ?REFERRING DIAG: S/P arthroscopy of left shoulder; R knee IT band pain ? ?THERAPY DIAG:  ?Chronic left shoulder pain ? ?Muscle weakness (generalized) ? ?Stiffness of left shoulder, not elsewhere classified ? ?Chronic pain of right knee ? ?SUBJECTIVE:                                                                                                                                                                                     ?  ?  SUBJECTIVE STATEMENT: ?L shoulder pain continue to improve. Pt reports R knee pain is the same. ?  ?PAIN:  ?Are you having pain? Yes ?NPRS scale: L shoulder- 0/10 L GH area; worst:7/10 brief. R knee 5/10 ?Pain location: L GH and upper trap ?Pain orientation: Left  ?PAIN TYPE: dull, sharp, and  stabbing, heavy ?Pain description: constant  ?Aggravating factors: Out of sling for shower ?Relieving factors: cold packs ? ?PERTINENT HISTORY: ?Previous arthroscopic surgery 02/27/21 ?  ?PRECAUTIONS:  ?Sling DCed on 03/25/22/ following appt c Dr. Magnus Ivan ?  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 Handles light objects ?  ?PATIENT GOALS: For my L shoulder to function as close to normal as possible ?  ?OBJECTIVE:  ?  ?DIAGNOSTIC FINDINGS:  ?IMPRESSION: L shoulder 10/10/21 ?1. Supraspinatus tendinosis with extensive irregular tear of the ?anterior and mid tendon with full-thickness components. This has ?significantly progressed compared to the previous study. Progressive ?supraspinatus muscle atrophy. ?2. Intra-articular biceps tendinosis. ?3. Moderate subacromial-subdeltoid bursitis. ?  ?PATIENT SURVEYS:  ?FOTO TBA ?  ?COGNITION: ?         Overall cognitive status: Within functional limits for tasks assessed ?                              ?SENSATION: ?         Light touch: Appears intact ?          ?  ?POSTURE: ?Forward head, rounded shoulder ?  ?UPPER EXTREMITY PROM: ?  ?A/PROM Right ?03/05/2022 Left ?03/05/2022 Left ?03/18/22 Left ?04/08/22  ?Shoulder flexion   120 140 tight end feel 150  ?Shoulder extension        ?Shoulder abduction   60 80 empty end feel 153  ?Shoulder adduction        ?Shoulder internal rotation        ?Shoulder external rotation   10 arm by side 40 c arm by side. Tight end feel c discomfort 58  ?Elbow flexion   WNLS    ?Elbow extension   WNLS    ?Wrist flexion   WNLS    ?Wrist extension   WNLS    ?Wrist ulnar deviation        ?Wrist radial deviation        ?Wrist pronation        ?Wrist supination        ?(Blank rows = not tested) ?  ?UPPER EXTREMITY MMT: ?Not assessed ?MMT Right ?03/05/2022 Left ?03/05/2022  ?Shoulder flexion      ?Shoulder extension      ?Shoulder abduction      ?Shoulder adduction      ?Shoulder internal rotation      ?Shoulder external rotation      ?Middle  trapezius      ?Lower trapezius      ?Elbow flexion      ?Elbow extension      ?Wrist flexion      ?Wrist extension      ?Wrist ulnar deviation      ?Wrist radial deviation      ?Wrist pronation      ?Wrist supination      ?Grip strength (lbs)      ?(Blank rows = not tested) ? ?04/02/22 R Knee Assessment : ?TTP post lat R knee in area of hamstring insertion ?R Knee strength 5/5, hip strength 4-/5 (L 4+/5) ?AROM of the R knee is WNLs ? ?           ?  TODAY'S TREATMENT:  ?[facility] Adult PT Treatment:                                                DATE: 04/08/22 ?Therapeutic Exercise: ?Seated shoulder AAROM on table top for flex, abd, ER 2x10 5" ?Isometric shoulder ext x10 5" ?Isometric shoulder IR x10 5" ?Seated hamstring stretch x3 20" ?Seated piriformis stretch x3 20" ?Standing ITB stretch x3 20" ? ?[facility] Adult PT Treatment:                                                DATE: 04/02/22 ?Therapeutic Exercise: ?Supine AAROM wand exs for shoulder flexion and ER x10 5" ( pt had difficulty with abd and it was Dced for the wand) ?Seated shoulder AAROM on table top for flex, abd, ER x10 5" ?Supine bilat arm presses into mat table x10 5 sec ?Bridging 10x ?R hip SLR 10x ?R hip abd 10x ?Supine hip abd 10x GTB ?HEP updated ? ?[facility] Adult PT Treatment:                                                DATE: 03/25/22 ?Therapeutic Exercise: ?Supine AAROM wand exs for shoulder flexion, abduction and ER x10 5" ?Supine bilat arm presses into mat table x10 5 sec ?Manual Therapy: ?PROM to the L shoulder for flexion, abduction, and ER as tolerate ?  ?PATIENT EDUCATION: ?Education details: Eval findings, POC, HEP, use of cold pack for L GH area pain and use of a heating pad for upper trap pain associated with sling ?Person educated: Patient and Spouse ?Education method: Explanation, Demonstration, Tactile cues, Verbal cues, and Handouts ?Education comprehension: verbalized understanding, returned demonstration, verbal cues required, and tactile cues  required ?  ?  ?HOME EXERCISE PROGRAM: ?Access Code: [email or web address] ?URL: [email or web address] ?Date: 04/08/2022 ?Prepared by: Joellyn Rued ? ?Exercises ?- Flexion-Extension Shoulder Pendulum with Table S

## 2022-04-08 ENCOUNTER — Ambulatory Visit: Payer: 59

## 2022-04-08 DIAGNOSIS — M25512 Pain in left shoulder: Secondary | ICD-10-CM | POA: Diagnosis not present

## 2022-04-08 DIAGNOSIS — G8929 Other chronic pain: Secondary | ICD-10-CM

## 2022-04-08 DIAGNOSIS — M25612 Stiffness of left shoulder, not elsewhere classified: Secondary | ICD-10-CM

## 2022-04-08 DIAGNOSIS — M6281 Muscle weakness (generalized): Secondary | ICD-10-CM

## 2022-04-10 ENCOUNTER — Ambulatory Visit: Payer: 59

## 2022-04-10 DIAGNOSIS — G8929 Other chronic pain: Secondary | ICD-10-CM

## 2022-04-10 DIAGNOSIS — M25512 Pain in left shoulder: Secondary | ICD-10-CM | POA: Diagnosis not present

## 2022-04-10 DIAGNOSIS — M6281 Muscle weakness (generalized): Secondary | ICD-10-CM

## 2022-04-10 DIAGNOSIS — M25612 Stiffness of left shoulder, not elsewhere classified: Secondary | ICD-10-CM

## 2022-04-10 NOTE — Therapy (Signed)
?OUTPATIENT PHYSICAL THERAPY TREATMENT NOTE/R knee Cert ? ? ?Patient Name: Gabriela Le ?MRN: [id number] ?DOB:02/13/1976, 46 y.o., female ?Today's Date: 04/10/2022 ? ?PCP: Olive BassMurray, Laura Woodruff, FNP ?REFERRING PROVIDER: Kathryne HitchBlackman, Christopher Y* ? ? PT End of Session - 04/10/22 1026   ? ? Visit Number 7   ? Number of Visits 12   ? Date for PT Re-Evaluation 05/08/22   ? Authorization Type [facility]   ? Progress Note Due on Visit 10   ? PT Start Time 1020   ? PT Stop Time 1105   ? PT Time Calculation (min) 45 min   ? Activity Tolerance Patient tolerated treatment well   ? Behavior [facility] for tasks assessed/performed   ? ?  ?  ? ?  ? ? ? ? ? ? ? ?Past Medical History:  ?Diagnosis Date  ? GERD (gastroesophageal reflux disease)   ? H/O cold sores   ? ACYCLOVIR OINTMENT PRN  ? Infertility associated with anovulation   ? HAS TAKEN CLOMID IN THE PAST  ? Irregular menses   ? Missed ab 12/29/2010  ? no surgery required  ? 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 ?Past Surgical History:  ?Procedure Laterality Date  ? APPENDECTOMY    ? 6 YOA  ? CESAREAN SECTION  01/06/2013  ? Procedure: CESAREAN SECTION;  Surgeon: Hal MoralesVanessa P Haygood, MD;  Location: [facility];  Service: Obstetrics;  Laterality: N/A;  Primary  ? CESAREAN SECTION N/A 10/09/2014  ? Procedure: CESAREAN SECTION;  Surgeon: Konrad FelixEma Wakuru Kulwa, MD;  Location: [facility];  Service: Obstetrics;  Laterality: N/A;  ? CESAREAN SECTION N/A 08/09/2017  ? Procedure: REPEAT CESAREAN SECTION;  Surgeon: Willodean RosenthalHarraway-Smith, Carolyn, MD;  [facility];  Service: Obstetrics;  Laterality: N/A;  ? CHOLECYSTECTOMY N/A 11/27/2014  ? Procedure: LAPAROSCOPIC CHOLECYSTECTOMY WITH INTRAOPERATIVE CHOLANGIOGRAM;  Surgeon: Chevis PrettyPaul Toth III, MD;  Location: [facility];  Service: General;  Laterality: N/A;  ? CYSTECTOMY    ? SHOULDER ARTHROSCOPY WITH ROTATOR CUFF REPAIR Left 02/19/2022  ? Procedure: LEFT SHOULDER ARTHROSCOPY WITH ROTATOR CUFF REPAIR;  Surgeon: Kathryne HitchBlackman, Christopher Y, MD;   Location: [facility];  Service: Orthopedics;  Laterality: Left;  ? SHOULDER ARTHROSCOPY WITH SUBACROMIAL DECOMPRESSION Left 02/27/2021  ? Procedure: LEFT SHOULDER ARTHROSCOPY WITH DEBRIDEMENT AND SUBACROMIAL DECOMPRESSION;  Surgeon: Kathryne HitchBlackman, Christopher Y, MD;  [facility];  Service: Orthopedics;  Laterality: Left;  ? ?Patient Active Problem List  ? Diagnosis Date Noted  ? Supraventricular tachycardia (HCC) 03/03/2022  ? Complete tear of left rotator cuff 02/19/2022  ? Dyspnea on exertion 11/27/2021  ? Dyslipidemia 11/27/2021  ? Irregular menses 11/25/2021  ? Infertility associated with anovulation 11/25/2021  ? H/O cold sores 11/25/2021  ? Palpitations 11/17/2021  ? Impingement syndrome of left shoulder 02/27/2021  ? Epidermoid cyst 11/04/2020  ? Candidal vulvovaginitis 11/04/2020  ? Late prenatal care in third trimester 06/23/2017  ? Supervision of high risk pregnancy, antepartum 02/15/2017  ? Advanced maternal age in multigravida, unspecified trimester 02/15/2017  ? Previous cesarean delivery, antepartum condition or complication 10/06/2014  ? 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 Missed ab 12/29/2010  ? ? ?REFERRING DIAG: S/P arthroscopy of left shoulder; R knee IT band pain ? ?THERAPY DIAG:  ?Chronic left shoulder pain ? ?Muscle weakness (generalized) ? ?Stiffness of left shoulder, not elsewhere classified ? ?Chronic pain of right knee ? ?SUBJECTIVE:                                                                                                                                                                                     ?  ?  SUBJECTIVE STATEMENT: ?Pt reports her R knee is not bothering her a lot today. Pt notes she experiences r knee pian in a full kneeling position and at the end of a day. ?  ?PAIN:  ?Are you having pain? Yes ?NPRS scale: L shoulder- 0/10 L GH area; worst:7/10 brief. R knee 5/10 ?Pain location: L  GH and upper trap ?Pain orientation: Left  ?PAIN TYPE: dull, sharp, and stabbing, heavy ?Pain description: constant  ?Aggravating factors: Out of sling for shower ?Relieving factors: cold packs ? ?PERTINENT HISTORY: ?Previous arthroscopic surgery 02/27/21 ?  ?PRECAUTIONS:  ?Sling DCed on 03/25/22/ following appt c Dr. Magnus Ivan ?  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 Handles light objects ?  ?PATIENT GOALS: For my L shoulder to function as close to normal as possible ?  ?OBJECTIVE:  ?  ?DIAGNOSTIC FINDINGS:  ?IMPRESSION: L shoulder 10/10/21 ?1. Supraspinatus tendinosis with extensive irregular tear of the ?anterior and mid tendon with full-thickness components. This has ?significantly progressed compared to the previous study. Progressive ?supraspinatus muscle atrophy. ?2. Intra-articular biceps tendinosis. ?3. Moderate subacromial-subdeltoid bursitis. ?  ?PATIENT SURVEYS:  ?FOTO TBA ?  ?COGNITION: ?         Overall cognitive status: Within functional limits for tasks assessed ?                              ?SENSATION: ?         Light touch: Appears intact ?          ?  ?POSTURE: ?Forward head, rounded shoulder ?  ?UPPER EXTREMITY PROM: ?  ?A/PROM Right ?03/05/2022 Left ?03/05/2022 Left ?03/18/22 Left ?04/08/22  ?Shoulder flexion   120 140 tight end feel 150  ?Shoulder extension        ?Shoulder abduction   60 80 empty end feel 153  ?Shoulder adduction        ?Shoulder internal rotation        ?Shoulder external rotation   10 arm by side 40 c arm by side. Tight end feel c discomfort 58  ?Elbow flexion   WNLS    ?Elbow extension   WNLS    ?Wrist flexion   WNLS    ?Wrist extension   WNLS    ?Wrist ulnar deviation        ?Wrist radial deviation        ?Wrist pronation        ?Wrist supination        ?(Blank rows = not tested) ?  ?UPPER EXTREMITY MMT: ?Not assessed ?MMT Right ?03/05/2022 Left ?03/05/2022  ?Shoulder flexion      ?Shoulder extension      ?Shoulder abduction      ?Shoulder adduction       ?Shoulder internal rotation      ?Shoulder external rotation      ?Middle trapezius      ?Lower trapezius      ?Elbow flexion      ?Elbow extension      ?Wrist flexion      ?Wrist extension      ?Wrist ulnar deviation      ?Wrist radial deviation      ?Wrist pronation      ?Wrist supination      ?Grip strength (lbs)      ?(Blank rows = not tested) ? ?04/02/22 R Knee Assessment : ?TTP post lat R knee in area of hamstring  insertion ?R Knee strength 5/5, hip strength 4-/5 (L 4+/5) ?AROM of the R knee is WNLs ? ?           ?TODAY'S TREATMENT:  ?[facility] Adult PT Treatment:                                                DATE: 04/10/22 ?Therapeutic Exercise: ?Seated hamstring stretch x3 20" ?Seated piriformis stretch x3 20" ?Standing ITB stretch x3 20" ?Bridging 10x2 ?SL R hip abd 10x2 ?SL R hip clam RTB 10x2 GTB ?HEP updated ?Modalities: ?Iontophoresis to the R lateral knee, ITB insertion. Dexamethasone 4mg /ml, 67ml, 6 hours ?Advised not to get wet and possible skin reaction to the adhesive  ?Self Care: ?Use of cold pack x 10 mins to manage R lat. Knee pain ? ?[facility] Adult PT Treatment:                                                DATE: 04/08/22 ?Therapeutic Exercise: ?Seated shoulder AAROM on table top for flex, abd, ER 2x10 5" ?Isometric shoulder ext x10 5" ?Isometric shoulder IR x10 5" ?Seated hamstring stretch x3 20" ?Seated piriformis stretch x3 20" ?Standing ITB stretch x3 20" ? ?[facility] Adult PT Treatment:                                                DATE: 04/02/22 ?Therapeutic Exercise: ?Supine AAROM wand exs for shoulder flexion and ER x10 5" ( pt had difficulty with abd and it was Dced for the wand) ?Seated shoulder AAROM on table top for flex, abd, ER x10 5" ?Supine bilat arm presses into mat table x10 5 sec ?Bridging 10x ?R hip SLR 10x ?R hip abd 10x ?Supine hip abd 10x GTB ?HEP updated ?  ?HOME EXERCISE PROGRAM: ?Access Code: [email or web address] ?URL: [email or web address] ?Date: 04/10/2022 ?Prepared by: 04/12/2022 ? ?Exercises ?- Flexion-Extension Shoulder Pendulum with Table Support  - 2 x daily - 7 x weekly - 2 sets - 15 reps ?- Horizontal Shoulder Pendulum with Table Support  - 2 x daily - 7 x weekly - 2 sets - 15 r

## 2022-04-13 ENCOUNTER — Telehealth: Payer: Self-pay

## 2022-04-13 NOTE — Telephone Encounter (Signed)
Prior authorization done through [facility] for the patient's Clobetasol Foam. ? ? ?[employer] is reviewing your PA request. Typically an electronic response will be received within 24-72 hours. To check for an update later, open this request from your dashboard. ? ?You may close this dialog and return to your dashboard to perform other tasks. ?

## 2022-04-13 NOTE — Telephone Encounter (Signed)
Fax received from [facility] stating that a prior authorization is needed for the patient's Clobetasol Foam.  ?

## 2022-04-14 NOTE — Therapy (Signed)
?OUTPATIENT PHYSICAL THERAPY TREATMENT NOTE/R knee Cert ? ? ?Patient Name: Gabriela Le ?MRN: [id number] ?DOB:1976/11/06, 46 y.o., female ?Today's Date: 04/15/2022 ? ?PCP: Olive Bass, FNP ?REFERRING PROVIDER: [name]* ? ? PT End of Session - 04/15/22 1539   ? ? Visit Number 8   ? Number of Visits 12   ? Date for PT Re-Evaluation 05/08/22   ? Authorization Type [employer] [facility] Medicaid   ? Progress Note Due on Visit 10   ? PT Start Time 1024   ? PT Stop Time 1104   ? PT Time Calculation (min) 40 min   ? Activity Tolerance Patient tolerated treatment well   ? Behavior During Therapy [facility] for tasks assessed/performed   ? ?  ?  ? ?  ? ? ? ? ? ? ? ? ?Past Medical History:  ?Diagnosis Date  ? GERD (gastroesophageal reflux disease)   ? H/O cold sores   ? ACYCLOVIR OINTMENT PRN  ? Infertility associated with anovulation   ? HAS TAKEN CLOMID IN THE PAST  ? Irregular menses   ? Missed ab 12/29/2010  ? no surgery required  ? 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 ?Past Surgical History:  ?Procedure Laterality Date  ? APPENDECTOMY    ? 6 YOA  ? CESAREAN SECTION  01/06/2013  ? Procedure: CESAREAN SECTION;  Surgeon: Hal Morales, MD;  Location: [facility];  Service: Obstetrics;  Laterality: N/A;  Primary  ? CESAREAN SECTION N/A 10/09/2014  ? Procedure: CESAREAN SECTION;  Surgeon: Konrad Felix, MD;  Location: [facility];  Service: Obstetrics;  Laterality: N/A;  ? CESAREAN SECTION N/A 08/09/2017  ? Procedure: REPEAT CESAREAN SECTION;  Surgeon: Willodean Rosenthal, MD;  Location: [facility];  Service: Obstetrics;  Laterality: N/A;  ? CHOLECYSTECTOMY N/A 11/27/2014  ? Procedure: LAPAROSCOPIC CHOLECYSTECTOMY WITH INTRAOPERATIVE CHOLANGIOGRAM;  Surgeon: Chevis Pretty III, MD;  Location: [facility];  Service: General;  Laterality: N/A;  ? CYSTECTOMY    ? SHOULDER ARTHROSCOPY WITH ROTATOR CUFF REPAIR Left 02/19/2022  ? Procedure: LEFT SHOULDER ARTHROSCOPY WITH ROTATOR CUFF REPAIR;  Surgeon: [name], MD;   Location: [facility];  Service: Orthopedics;  Laterality: Left;  ? SHOULDER ARTHROSCOPY WITH SUBACROMIAL DECOMPRESSION Left 02/27/2021  ? Procedure: LEFT SHOULDER ARTHROSCOPY WITH DEBRIDEMENT AND SUBACROMIAL DECOMPRESSION;  Surgeon: [name], MD;  Location: [facility];  Service: Orthopedics;  Laterality: Left;  ? ?Patient Active Problem List  ? Diagnosis Date Noted  ? Supraventricular tachycardia (HCC) 03/03/2022  ? Complete tear of left rotator cuff 02/19/2022  ? Dyspnea on exertion 11/27/2021  ? Dyslipidemia 11/27/2021  ? Irregular menses 11/25/2021  ? Infertility associated with anovulation 11/25/2021  ? H/O cold sores 11/25/2021  ? Palpitations 11/17/2021  ? Impingement syndrome of left shoulder 02/27/2021  ? Epidermoid cyst 11/04/2020  ? Candidal vulvovaginitis 11/04/2020  ? Late prenatal care in third trimester 06/23/2017  ? Supervision of high risk pregnancy, antepartum 02/15/2017  ? Advanced maternal age in multigravida, unspecified trimester 02/15/2017  ? Previous cesarean delivery, antepartum condition or complication 10/06/2014  ? 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 Missed ab 12/29/2010  ? ? ?REFERRING DIAG: S/P arthroscopy of left shoulder; R knee IT band pain ? ?THERAPY DIAG:  ?Chronic left shoulder pain ? ?Muscle weakness (generalized) ? ?Stiffness of left shoulder, not elsewhere classified ? ?Chronic pain of right knee ? ?SUBJECTIVE:                                                                                                                                                                                     ?  ?  SUBJECTIVE STATEMENT: ?2 days ago her R knee started bothering her more. Pt notes the standing R ITB stretch bothers her knee. The ionto patch did not help her knee pain.  ?  ?PAIN:  ?Are you having pain? Yes ?NPRS scale: L shoulder- 0/10 L GH area; worst:7/10 brief. R knee 5/10 ?Pain location:  L GH and upper trap ?Pain orientation: Left  ?PAIN TYPE: dull, sharp, and stabbing, heavy ?Pain description: constant  ?Aggravating factors: Out of sling for shower ?Relieving factors: cold packs ? ?PERTINENT HISTORY: ?Previous arthroscopic surgery 02/27/21 ?  ?PRECAUTIONS:  ?Sling DCed on 03/25/22/ following appt c Dr. Magnus IvanBlackman ?  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 Handles light objects ?  ?PATIENT GOALS: For my L shoulder to function as close to normal as possible ?  ?OBJECTIVE:  ?  ?DIAGNOSTIC FINDINGS:  ?IMPRESSION: L shoulder 10/10/21 ?1. Supraspinatus tendinosis with extensive irregular tear of the ?anterior and mid tendon with full-thickness components. This has ?significantly progressed compared to the previous study. Progressive ?supraspinatus muscle atrophy. ?2. Intra-articular biceps tendinosis. ?3. Moderate subacromial-subdeltoid bursitis. ?  ?PATIENT SURVEYS:  ?FOTO TBA ?  ?COGNITION: ?         Overall cognitive status: Within functional limits for tasks assessed ?                              ?SENSATION: ?         Light touch: Appears intact ?          ?  ?POSTURE: ?Forward head, rounded shoulder ?  ?UPPER EXTREMITY PROM: ?  ?A/PROM Right ?03/05/2022 Left ?03/05/2022 Left ?03/18/22 Left ?04/08/22  ?Shoulder flexion   120 140 tight end feel 150  ?Shoulder extension        ?Shoulder abduction   60 80 empty end feel 153  ?Shoulder adduction        ?Shoulder internal rotation        ?Shoulder external rotation   10 arm by side 40 c arm by side. Tight end feel c discomfort 58  ?Elbow flexion   WNLS    ?Elbow extension   WNLS    ?Wrist flexion   WNLS    ?Wrist extension   WNLS    ?Wrist ulnar deviation        ?Wrist radial deviation        ?Wrist pronation        ?Wrist supination        ?(Blank rows = not tested) ?  ?UPPER EXTREMITY MMT: ?Not assessed ?MMT Right ?03/05/2022 Left ?03/05/2022  ?Shoulder flexion      ?Shoulder extension      ?Shoulder abduction      ?Shoulder adduction       ?Shoulder internal rotation      ?Shoulder external rotation      ?Middle trapezius      ?Lower trapezius      ?Elbow flexion      ?Elbow extension      ?Wrist flexion      ?Wrist extension      ?Wrist ulnar deviation      ?Wrist radial deviation      ?Wrist pronation      ?Wrist supination      ?Grip strength (lbs)      ?(Blank rows = not tested) ? ?04/02/22 R Knee Assessment : ?TTP post lat R knee in area of hamstring insertion ?  R Knee strength 5/5, hip strength 4-/5 (L 4+/5) ?AROM of the R knee is WNLs ? ?           ?TODAY'S TREATMENT:  ?[facility] Adult PT Treatment:                                                DATE: 04/15/22 ?Therapeutic Exercise: ?Seated hamstring stretch x3 20" ?Seated piriformis stretch x3 20" ?Supine ITB stretch c strap 3 20" ( replaced standing ex) ?Bridging c static hip abd c RTB 10x2 ?SL R hip abd 2x10 ?Isometric shoulder ext x10 5" ?Isometric shoulder IR x10 5" ?SL R hip clam RTB 10x2 GTB ?HEP updated ? ?[facility] Adult PT Treatment:                                                DATE: 04/10/22 ?Therapeutic Exercise: ?Seated hamstring stretch x3 20" ?Seated piriformis stretch x3 20" ?Standing ITB stretch x3 20" ?Bridging 10x2 ?SL R hip abd 10x2 ?SL R hip clam RTB 10x2 GTB ?HEP updated ?Modalities: ?Iontophoresis to the R lateral knee, ITB insertion. Dexamethasone 4mg /ml, 79ml, 6 hours ?Advised not to get wet and possible skin reaction to the adhesive  ?Self Care: ?Use of cold pack x 10 mins to manage R lat. Knee pain ? ?[facility] Adult PT Treatment:                                                DATE: 04/08/22 ?Therapeutic Exercise: ?Seated shoulder AAROM on table top for flex, abd, ER 2x10 5" ?Isometric shoulder ext x10 5" ?Isometric shoulder IR x10 5" ?Seated hamstring stretch x3 20" ?Seated piriformis stretch x3 20" ?Standing ITB stretch x3 20" ?  ?HOME EXERCISE PROGRAM: ?Access Code: [email or web address] ?URL: [email or web address] ?Date: 04/15/2022 ?Prepared by: 04/17/2022 ? ?Exercises ?-  Flexion-Extension Shoulder Pendulum with Table Support  - 2 x daily - 7 x weekly - 2 sets - 15 reps ?- Horizontal Shoulder Pendulum with Table Support  - 2 x daily - 7 x weekly - 2 sets - 15 reps ?- Supine Should

## 2022-04-15 ENCOUNTER — Ambulatory Visit: Payer: 59

## 2022-04-15 DIAGNOSIS — M25612 Stiffness of left shoulder, not elsewhere classified: Secondary | ICD-10-CM

## 2022-04-15 DIAGNOSIS — M25512 Pain in left shoulder: Secondary | ICD-10-CM | POA: Diagnosis not present

## 2022-04-15 DIAGNOSIS — M6281 Muscle weakness (generalized): Secondary | ICD-10-CM

## 2022-04-15 DIAGNOSIS — G8929 Other chronic pain: Secondary | ICD-10-CM

## 2022-04-21 NOTE — Therapy (Signed)
?OUTPATIENT PHYSICAL THERAPY TREATMENT NOTE/R knee Cert ? ? ?Patient Name: Gabriela Le ?MRN: [id number] ?DOB:26-Feb-1976, 46 y.o., female ?Today's Date: 04/22/2022 ? ?PCP: Olive Bass, FNP ?REFERRING PROVIDER: Olive Bass,* ? ? PT End of Session - 04/22/22 1035   ? ? Visit Number 9   ? Number of Visits 12   ? Date for PT Re-Evaluation 05/08/22   ? Authorization Type [facility] Medicaid   ? Progress Note Due on Visit 10   ? PT Start Time 1021   ? PT Stop Time 1103   ? PT Time Calculation (min) 42 min   ? Activity Tolerance Patient tolerated treatment well   ? Behavior During Therapy [facility] for tasks assessed/performed   ? ?  ?  ? ?  ? ? ? ? ? ? ? ? ? ?Past Medical History:  ?Diagnosis Date  ? GERD (gastroesophageal reflux disease)   ? H/O cold sores   ? ACYCLOVIR OINTMENT PRN  ? Infertility associated with anovulation   ? HAS TAKEN CLOMID IN THE PAST  ? Irregular menses   ? Missed ab 12/29/2010  ? no surgery required  ? 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 ?Past Surgical History:  ?Procedure Laterality Date  ? APPENDECTOMY    ? 6 YOA  ? CESAREAN SECTION  01/06/2013  ? Procedure: CESAREAN SECTION;  Surgeon: Hal Morales, MD;  Location: [facility];  Service: Obstetrics;  Laterality: N/A;  Primary  ? CESAREAN SECTION N/A 10/09/2014  ? Procedure: CESAREAN SECTION;  Surgeon: Konrad Felix, MD;  Location: [facility];  Service: Obstetrics;  Laterality: N/A;  ? CESAREAN SECTION N/A 08/09/2017  ? Procedure: REPEAT CESAREAN SECTION;  Surgeon: Willodean Rosenthal, MD;  Location: [facility]IRTHING SUITES;  Service: Obstetrics;  Laterality: N/A;  ? CHOLECYSTECTOMY N/A 11/27/2014  ? Procedure: LAPAROSCOPIC CHOLECYSTECTOMY WITH INTRAOPERATIVE CHOLANGIOGRAM;  Surgeon: Chevis Pretty III, MD;  Location: [facility];  Service: General;  Laterality: N/A;  ? CYSTECTOMY    ? SHOULDER ARTHROSCOPY WITH ROTATOR CUFF REPAIR Left 02/19/2022  ? Procedure: LEFT SHOULDER ARTHROSCOPY WITH ROTATOR CUFF REPAIR;  Surgeon: [name], MD;   Location: [facility];  Service: Orthopedics;  Laterality: Left;  ? SHOULDER ARTHROSCOPY WITH SUBACROMIAL DECOMPRESSION Left 02/27/2021  ? Procedure: LEFT SHOULDER ARTHROSCOPY WITH DEBRIDEMENT AND SUBACROMIAL DECOMPRESSION;  Surgeon: [name], MD;  Location: [facility];  Service: Orthopedics;  Laterality: Left;  ? ?Patient Active Problem List  ? Diagnosis Date Noted  ? Supraventricular tachycardia (HCC) 03/03/2022  ? Complete tear of left rotator cuff 02/19/2022  ? Dyspnea on exertion 11/27/2021  ? Dyslipidemia 11/27/2021  ? Irregular menses 11/25/2021  ? Infertility associated with anovulation 11/25/2021  ? H/O cold sores 11/25/2021  ? Palpitations 11/17/2021  ? Impingement syndrome of left shoulder 02/27/2021  ? Epidermoid cyst 11/04/2020  ? Candidal vulvovaginitis 11/04/2020  ? Late prenatal care in third trimester 06/23/2017  ? Supervision of high risk pregnancy, antepartum 02/15/2017  ? Advanced maternal age in multigravida, unspecified trimester 02/15/2017  ? Previous cesarean delivery, antepartum condition or complication 10/06/2014  ? 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 Missed ab 12/29/2010  ? ? ?REFERRING DIAG: S/P arthroscopy of left shoulder; R knee IT band pain ? ?THERAPY DIAG:  ?Chronic left shoulder pain ? ?Muscle weakness (generalized) ? ?Stiffness of left shoulder, not elsewhere classified ? ?Chronic pain of right knee ? ?SUBJECTIVE:                                                                                                                                                                                     ?  ?  SUBJECTIVE STATEMENT: ?Pt reports her L shoulder is continuing to improve re: pain and ease of mobility. Pt reports her R knee is not getting better. The knee is popping and gets stuck. ?  ?PAIN:  ?Are you having pain? Yes ?NPRS scale: L shoulder- 0/10 L GH area; worst:5/10 brief. R knee  8/10 ?Pain location: L GH and upper trap ?Pain orientation: Left  ?PAIN TYPE: dull, sharp, and stabbing, heavy ?Pain description: constant  ?Aggravating factors: Out of sling for shower ?Relieving factors: cold packs ? ?PERTINENT HISTORY: ?Previous arthroscopic surgery 02/27/21 ?  ?PRECAUTIONS:  ?Sling DCed on 03/25/22/ following appt c Dr. Magnus IvanBlackman ?  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 Handles light objects ?  ?PATIENT GOALS: For my L shoulder to function as close to normal as possible ?  ?OBJECTIVE:  ?  ?DIAGNOSTIC FINDINGS:  ?IMPRESSION: L shoulder 10/10/21 ?1. Supraspinatus tendinosis with extensive irregular tear of the ?anterior and mid tendon with full-thickness components. This has ?significantly progressed compared to the previous study. Progressive ?supraspinatus muscle atrophy. ?2. Intra-articular biceps tendinosis. ?3. Moderate subacromial-subdeltoid bursitis. ?  ?PATIENT SURVEYS:  ?FOTO TBA ?  ?COGNITION: ?         Overall cognitive status: Within functional limits for tasks assessed ?                              ?SENSATION: ?         Light touch: Appears intact ?          ?  ?POSTURE: ?Forward head, rounded shoulder ?  ?UPPER EXTREMITY PROM: ?  ?A/PROM Right ?03/05/2022 Left ?03/05/2022 Left ?03/18/22 Left ?04/08/22 Left ?04/22/22  ?Shoulder flexion   120 140 tight end feel 150 155  ?Shoulder extension         ?Shoulder abduction   60 80 empty end feel 153 155  ?Shoulder adduction         ?Shoulder internal rotation         ?Shoulder external rotation   10 arm by side 40 c arm by side. Tight end feel c discomfort 58 65  ?Elbow flexion   WNLS     ?Elbow extension   WNLS     ?Wrist flexion   WNLS     ?Wrist extension   WNLS     ?Wrist ulnar deviation         ?Wrist radial deviation         ?Wrist pronation         ?Wrist supination         ?(Blank rows = not tested) ?  ?UPPER EXTREMITY MMT: ?Not assessed ?MMT Right ?03/05/2022 Left ?03/05/2022  ?Shoulder flexion      ?Shoulder extension       ?Shoulder abduction      ?Shoulder adduction      ?Shoulder internal rotation      ?Shoulder external rotation      ?Middle trapezius      ?Lower trapezius      ?Elbow flexion      ?Elbow extension      ?Wrist flexion      ?Wrist extension      ?Wrist ulnar deviation      ?Wrist radial deviation      ?Wrist pronation      ?Wrist supination      ?Grip strength (lbs)      ?(Blank rows = not  tested) ? ?04/02/22 R Knee Assessment : ?TTP post lat R knee in area of hamstring insertion ?R Knee strength 5/5, hip strength 4-/5 (L 4+/5) ?AROM of the R knee is WNLs ? ?           ?TODAY'S TREATMENT: ?[facility] Adult PT Treatment:                                                DATE: 04/22/22 ?Therapeutic Exercise: ?Seated hamstring stretch x3 20" ?Seated piriformis stretch x3 20" ?Supine ITB stretch c strap 3 20"  ?Bridging c static hip abd c RTB 10x2 ?HL hip abd c BluTB 10x2 ?R SLR 10x2 3# ?Table top AAROM for flexion, abd, and ER x10 5" ?Isometric shoulder ext x5 5" ?Isometric shoulder IR x5 5" ?Isometric shoulder flex x5 5" ?Isometric shoulder abd x5 5" ?Isometric shoulder  ER x10 5" ?SL R hip clam RTB 10x2 GTB ?  ?[facility] Adult PT Treatment:                                                DATE: 04/15/22 ?Therapeutic Exercise: ?Seated hamstring stretch x3 20" ?Seated piriformis stretch x3 20" ?Supine ITB stretch c strap 3 20" ( replaced standing ex) ?Bridging c static hip abd c RTB 10x2 ?SL R hip abd 2x10 ?Table top AAROM for flexion, abd, and ER x10 5" ?Isometric shoulder ext x5 5" ?Isometric shoulder IR x5 5" ?Isometric shoulder flex x5 5" ?Isometric shoulder abd x5 5" ?Isometric shoulder  ER x10 5" ?SL R hip clam RTB 10x2 GTB ?HEP updated ? ?[facility] Adult PT Treatment:                                                DATE: 04/10/22 ?Therapeutic Exercise: ?Seated hamstring stretch x3 20" ?Seated piriformis stretch x3 20" ?Standing ITB stretch x3 20" ?Bridging 10x2 ?SL R hip abd 10x2 ?SL R hip clam RTB 10x2 GTB ?HEP  updated ?Modalities: ?Iontophoresis to the R lateral knee, ITB insertion. Dexamethasone 4mg /ml, 28ml, 6 hours ?Advised not to get wet and possible skin reaction to the adhesive  ?Self Care: ?Use of cold pack x 10 mins to manage R

## 2022-04-22 ENCOUNTER — Encounter: Payer: Self-pay | Admitting: Orthopaedic Surgery

## 2022-04-22 ENCOUNTER — Ambulatory Visit (INDEPENDENT_AMBULATORY_CARE_PROVIDER_SITE_OTHER): Payer: 59 | Admitting: Orthopaedic Surgery

## 2022-04-22 ENCOUNTER — Ambulatory Visit: Payer: 59

## 2022-04-22 VITALS — BP 148/77 | HR 67

## 2022-04-22 DIAGNOSIS — M6281 Muscle weakness (generalized): Secondary | ICD-10-CM

## 2022-04-22 DIAGNOSIS — G8929 Other chronic pain: Secondary | ICD-10-CM

## 2022-04-22 DIAGNOSIS — Z9889 Other specified postprocedural states: Secondary | ICD-10-CM

## 2022-04-22 DIAGNOSIS — M25612 Stiffness of left shoulder, not elsewhere classified: Secondary | ICD-10-CM

## 2022-04-22 DIAGNOSIS — M25561 Pain in right knee: Secondary | ICD-10-CM

## 2022-04-22 DIAGNOSIS — M25512 Pain in left shoulder: Secondary | ICD-10-CM | POA: Diagnosis not present

## 2022-04-22 NOTE — Progress Notes (Signed)
The patient is well-known to me.  She is about 2 months out from a left shoulder arthroscopy with a rotator cuff repair.  We were not able to repair all the rotator cuff tear but we got a decent amount repaired.  She said that the shoulder is doing much better with increased range of motion and strength going to therapy.  She is still having significant right knee pain and we have tried anti-inflammatories and conservative treatment of the right knee and even physical therapy on the right knee.  She continues to have locking catching and pain with her right knee. ? ?Examination of her left shoulder still shows weakness of the rotator cuff and using her deltoid to abduct her shoulder but overall she does look better.  The right knee continues to have global tenderness especially the medial joint line and there is concerned about meniscal tear.  There is a lot of popping in her knee today as I was examining her right knee and it was very painful. ? ?This point I would like to send her for an MRI of her right knee to rule out a meniscal tear given the failure of conservative treatment.  We will see her in follow-up to go over the MRI once that is obtained. ?

## 2022-04-23 ENCOUNTER — Other Ambulatory Visit: Payer: Self-pay

## 2022-04-23 DIAGNOSIS — M25561 Pain in right knee: Secondary | ICD-10-CM

## 2022-04-23 NOTE — Therapy (Signed)
?OUTPATIENT PHYSICAL THERAPY TREATMENT NOTE/Progrress Note ? ? ?Patient Name: Gabriela Le ?MRN: [id number] ?DOB:[birth year], 46 y.o., female ?Today's Date: 04/24/2022 ? ?Progress Note ?Reporting Period 03/04/22 to 04/24/22 ? ?See note below for Objective Data and Assessment of Progress/Goals.  ? ?  ? ?PCP: Olive Bass, FNP ?REFERRING PROVIDER: Olive Bass,* ? ? PT End of Session - 04/24/22 1041   ? ? Visit Number 10   ? Number of Visits 12   ? Date for PT Re-Evaluation 05/08/22   ? Authorization Type [facility] Medicaid   ? Progress Note Due on Visit 10   ? PT Start Time 1020   ? PT Stop Time 1100   ? PT Time Calculation (min) 40 min   ? Activity Tolerance Patient tolerated treatment well   ? Behavior During Therapy [facility] for tasks assessed/performed   ? ?  ?  ? ?  ? ? ? ? ? ? ? ? ? ? ?Past Medical History:  ?Diagnosis Date  ? GERD (gastroesophageal reflux disease)   ? H/O cold sores   ? ACYCLOVIR OINTMENT PRN  ? Infertility associated with anovulation   ? HAS TAKEN CLOMID IN THE PAST  ? Irregular menses   ? Missed ab 12/29/2010  ? no surgery required  ? 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 ?Past Surgical History:  ?Procedure Laterality Date  ? APPENDECTOMY    ? 6 YOA  ? CESAREAN SECTION  01/06/2013  ? Procedure: CESAREAN SECTION;  Surgeon: Hal Morales, MD;  Location: [facility];  Service: Obstetrics;  Laterality: N/A;  Primary  ? CESAREAN SECTION N/A 10/09/2014  ? Procedure: CESAREAN SECTION;  Surgeon: Konrad Felix, MD;  Location: [facility];  Service: Obstetrics;  Laterality: N/A;  ? CESAREAN SECTION N/A 08/09/2017  ? Procedure: REPEAT CESAREAN SECTION;  Surgeon: Willodean Rosenthal, MD;  Location: [facility];  Service: Obstetrics;  Laterality: N/A;  ? CHOLECYSTECTOMY N/A 11/27/2014  ? Procedure: LAPAROSCOPIC CHOLECYSTECTOMY WITH INTRAOPERATIVE CHOLANGIOGRAM;  Surgeon: Chevis Pretty III, MD;  Location: [facility];  Service: General;  Laterality: N/A;  ? CYSTECTOMY    ? SHOULDER ARTHROSCOPY WITH  ROTATOR CUFF REPAIR Left 02/19/2022  ? Procedure: LEFT SHOULDER ARTHROSCOPY WITH ROTATOR CUFF REPAIR;  Surgeon: [name], MD;  Location: [facility];  Service: Orthopedics;  Laterality: Left;  ? SHOULDER ARTHROSCOPY WITH SUBACROMIAL DECOMPRESSION Left 02/27/2021  ? Procedure: LEFT SHOULDER ARTHROSCOPY WITH DEBRIDEMENT AND SUBACROMIAL DECOMPRESSION;  Surgeon: [name], MD;  Location: [facility];  Service: Orthopedics;  Laterality: Left;  ? ?Patient Active Problem List  ? Diagnosis Date Noted  ? Supraventricular tachycardia (HCC) 03/03/2022  ? Complete tear of left rotator cuff 02/19/2022  ? Dyspnea on exertion 11/27/2021  ? Dyslipidemia 11/27/2021  ? Irregular menses 11/25/2021  ? Infertility associated with anovulation 11/25/2021  ? H/O cold sores 11/25/2021  ? Palpitations 11/17/2021  ? Impingement syndrome of left shoulder 02/27/2021  ? Epidermoid cyst 11/04/2020  ? Candidal vulvovaginitis 11/04/2020  ? Late prenatal care in third trimester 06/23/2017  ? Supervision of high risk pregnancy, antepartum 02/15/2017  ? Advanced maternal age in multigravida, unspecified trimester 02/15/2017  ? Previous cesarean delivery, antepartum condition or complication 10/06/2014  ? 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 Missed ab 12/29/2010  ? ? ?REFERRING DIAG: S/P arthroscopy of left shoulder; R knee IT band pain ? ?THERAPY DIAG:  ?Chronic left shoulder pain ? ?Muscle weakness (  generalized) ? ?Stiffness of left shoulder, not elsewhere classified ? ?Chronic pain of right knee ? ?SUBJECTIVE:                                                                                                                                                                                     ?  ?SUBJECTIVE STATEMENT: ?Pt reports she is to have an MRI on the R knee. Pt has returned to work on light duty. L shoulder continues to improve. ?  ?PAIN:   ?Are you having pain? Yes ?NPRS scale: L shoulder- 0/10 L GH area; worst:5/10 brief. R knee 8/10 ?Pain location: L GH and upper trap ?Pain orientation: Left  ?PAIN TYPE: dull, sharp, and stabbing, heavy ?Pain description: constant  ?Aggravating factors: Out of sling for shower ?Relieving factors: cold packs ? ?PERTINENT HISTORY: ?Previous arthroscopic surgery 02/27/21 ?  ?PRECAUTIONS:  ?Sling DCed on 03/25/22/ following appt c Dr. Magnus Ivan ?  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 Handles light objects ?  ?PATIENT GOALS: For my L shoulder to function as close to normal as possible ?  ?OBJECTIVE:  ?  ?DIAGNOSTIC FINDINGS:  ?IMPRESSION: L shoulder 10/10/21 ?1. Supraspinatus tendinosis with extensive irregular tear of the ?anterior and mid tendon with full-thickness components. This has ?significantly progressed compared to the previous study. Progressive ?supraspinatus muscle atrophy. ?2. Intra-articular biceps tendinosis. ?3. Moderate subacromial-subdeltoid bursitis. ?  ?PATIENT SURVEYS:  ?FOTO TBA ?  ?COGNITION: ?         Overall cognitive status: Within functional limits for tasks assessed ?                              ?SENSATION: ?         Light touch: Appears intact ?          ?  ?POSTURE: ?Forward head, rounded shoulder ?  ?UPPER EXTREMITY PROM: ?  ?A/PROM Right ?03/05/2022 Left ?03/05/2022 Left ?03/18/22 Left ?04/08/22 Left ?04/22/22  ?Shoulder flexion   120 140 tight end feel 150 155  ?Shoulder extension         ?Shoulder abduction   60 80 empty end feel 153 155  ?Shoulder adduction         ?Shoulder internal rotation         ?Shoulder external rotation   10 arm by side 40 c arm by side. Tight end feel c discomfort 58 65  ?Elbow flexion   WNLS     ?Elbow extension   WNLS     ?Wrist flexion   WNLS     ?Wrist extension   WNLS     ?  Wrist ulnar deviation         ?Wrist radial deviation         ?Wrist pronation         ?Wrist supination         ?(Blank rows = not tested) ?  ?UPPER EXTREMITY  MMT: ?Not assessed ?MMT Right ?03/05/2022 Left ?03/05/2022  ?Shoulder flexion      ?Shoulder extension      ?Shoulder abduction      ?Shoulder adduction      ?Shoulder internal rotation      ?Shoulder external rotation      ?Middle trapezius      ?Lower trapezius      ?Elbow flexion      ?Elbow extension      ?Wrist flexion      ?Wrist extension      ?Wrist ulnar deviation      ?Wrist radial deviation      ?Wrist pronation      ?Wrist supination      ?Grip strength (lbs)      ?(Blank rows = not tested) ? ?04/02/22 R Knee Assessment : ?TTP post lat R knee in area of hamstring insertion ?R Knee strength 5/5, hip strength 4-/5 (L 4+/5) ?AROM of the R knee is WNLs ? ?           ?TODAY'S TREATMENT: ?[facility] Adult PT Treatment:                                                DATE: 04/24/22 ?Therapeutic Exercise: ?Bridging c static hip abd c RTB 10x2 ?SL hip clams c GTB 10x2 ?R SLR 10x2 3# ?SL hip abd 10x2 3# ?Table top AAROM for flexion, abd, and ER x10 5" ?Isometric shoulder ext x8 5" ?Isometric shoulder IR x8 5" ?Isometric shoulder flex x8 5" ?Isometric shoulder abd x8 5" ?Isometric shoulder  ER x8 5" ? ?[facility] Adult PT Treatment:                                                DATE: 04/22/22 ?Therapeutic Exercise: ?Seated hamstring stretch x3 20" ?Seated piriformis stretch x3 20" ?Supine ITB stretch c strap 3 20"  ?Bridging c static hip abd c RTB 10x2 ?HL hip abd c BluTB 10x2 ?R SLR 10x2 3# ?Table top AAROM for flexion, abd, and ER x10 5" ?Isometric shoulder ext x5 5" ?Isometric shoulder IR x5 5" ?Isometric shoulder flex x5 5" ?Isometric shoulder abd x5 5" ?Isometric shoulder  ER x10 5" ?SL R hip clam RTB 10x2 GTB ?  ?[facility] Adult PT Treatment:                                                DATE: 04/15/22 ?Therapeutic Exercise: ?Seated hamstring stretch x3 20" ?Seated piriformis stretch x3 20" ?Supine ITB stretch c strap 3 20" ( replaced standing ex) ?Bridging c static hip abd c RTB 10x2 ?SL R hip abd 2x10 ?Table top AAROM for flexion,  abd, and ER x10 5" ?Isometric shoulder ext x5 5" ?Isometric shoulder IR x5 5" ?Isometric shoulder flex  x5 5" ?Isometric shoulder abd x5 5" ?Isometric shoulder  ER x10 5" ?SL R hip clam RTB 10x2 GTB ?HEP updated ?

## 2022-04-24 ENCOUNTER — Ambulatory Visit: Payer: 59

## 2022-04-24 DIAGNOSIS — M25512 Pain in left shoulder: Secondary | ICD-10-CM

## 2022-04-24 DIAGNOSIS — M25612 Stiffness of left shoulder, not elsewhere classified: Secondary | ICD-10-CM

## 2022-04-24 DIAGNOSIS — M6281 Muscle weakness (generalized): Secondary | ICD-10-CM

## 2022-04-24 DIAGNOSIS — G8929 Other chronic pain: Secondary | ICD-10-CM

## 2022-04-26 ENCOUNTER — Encounter: Payer: Self-pay | Admitting: Dermatology

## 2022-04-26 NOTE — Progress Notes (Signed)
? ?  New Patient ?  ?Subjective  ?Gabriela Le is a 46 y.o. female who presents for the following: Hair/Scalp Problem (Pt states she has been losing her hair all over x year. Pt also says her hair is breaking off. Pt states she has used an unknown oil to treat. No itching no bleeding. ). ? ?Hair loss and breakage scalp ?Location:  ?Duration:  ?Quality:  ?Associated Signs/Symptoms: ?Modifying Factors:  ?Severity:  ?Timing: ?Context:  ? ? ?The following portions of the chart were reviewed this encounter and updated as appropriate:  Tobacco  Allergies  Meds  Problems  Med Hx  Surg Hx  Fam Hx   ?  ? ?Objective  ?Well appearing patient in no apparent distress; mood and affect are within normal limits. ?Scalp ?Clinically this is a mixture of loss pattern, predominantly crown with some follicular dropout and perhaps subtle edema.  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 ? ? ? ?A focused examination was performed including head, face, ears, scalp, nails.. Relevant physical exam findings are noted in the Assessment and Plan. ? ? ?Assessment & Plan  ?Alopecia ?Scalp ? ?Discussed ways to minimize trauma to hair.  We will initially use clobetasol foam daily for 1 to 2 months; recheck at that time.  We will also schedule distal appointment for second opinion with Dr. Isaac Laud. ? ?clobetasol (OLUX) 0.05 % topical foam - Scalp ?Apply topically 2 (two) times daily. ? ? ?

## 2022-04-30 ENCOUNTER — Ambulatory Visit: Payer: 59 | Attending: Orthopaedic Surgery

## 2022-04-30 DIAGNOSIS — M6281 Muscle weakness (generalized): Secondary | ICD-10-CM

## 2022-04-30 DIAGNOSIS — M25561 Pain in right knee: Secondary | ICD-10-CM | POA: Insufficient documentation

## 2022-04-30 DIAGNOSIS — G8929 Other chronic pain: Secondary | ICD-10-CM

## 2022-04-30 DIAGNOSIS — M25612 Stiffness of left shoulder, not elsewhere classified: Secondary | ICD-10-CM

## 2022-04-30 DIAGNOSIS — M25512 Pain in left shoulder: Secondary | ICD-10-CM | POA: Insufficient documentation

## 2022-04-30 DIAGNOSIS — R293 Abnormal posture: Secondary | ICD-10-CM | POA: Insufficient documentation

## 2022-04-30 NOTE — Therapy (Signed)
?OUTPATIENT PHYSICAL THERAPY TREATMENT NOTE ? ? ?Patient Name: Gabriela Le ?MRN: [id number] ?DOB:Oct 16, 1976, 46 y.o., female ?Today's Date: 05/01/2022 ? ?Progress Note ?Reporting Period 03/04/22 to 04/24/22 ? ?See note below for Objective Data and Assessment of Progress/Goals.  ? ?  ? ?PCP: Olive Bass, FNP ?REFERRING PROVIDER: [name]* ? ? PT End of Session - 05/01/22 1421   ? ? Visit Number 11   ? Number of Visits 12   ? Date for PT Re-Evaluation 05/08/22   ? Authorization Type [facility] Medicaid   ? Progress Note Due on Visit 10   ? PT Start Time 1550   ? PT Stop Time 1630   ? PT Time Calculation (min) 40 min   ? Activity Tolerance Patient tolerated treatment well   ? Behavior During Therapy [facility] for tasks assessed/performed   ? ?  ?  ? ?  ? ? ? ? ? ? ? ? ? ? ? ?Past Medical History:  ?Diagnosis Date  ? GERD (gastroesophageal reflux disease)   ? H/O cold sores   ? ACYCLOVIR OINTMENT PRN  ? Infertility associated with anovulation   ? HAS TAKEN CLOMID IN THE PAST  ? Irregular menses   ? Missed ab 12/29/2010  ? no surgery required  ? 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 ?Past Surgical History:  ?Procedure Laterality Date  ? APPENDECTOMY    ? 6 YOA  ? CESAREAN SECTION  01/06/2013  ? Procedure: CESAREAN SECTION;  Surgeon: Hal Morales, MD;  Location: [facility];  Service: Obstetrics;  Laterality: N/A;  Primary  ? CESAREAN SECTION N/A 10/09/2014  ? Procedure: CESAREAN SECTION;  Surgeon: Konrad Felix, MD;  Location: [facility];  Service: Obstetrics;  Laterality: N/A;  ? CESAREAN SECTION N/A 08/09/2017  ? Procedure: REPEAT CESAREAN SECTION;  Surgeon: Willodean Rosenthal, MD;  Location: [facility];  Service: Obstetrics;  Laterality: N/A;  ? CHOLECYSTECTOMY N/A 11/27/2014  ? Procedure: LAPAROSCOPIC CHOLECYSTECTOMY WITH INTRAOPERATIVE CHOLANGIOGRAM;  Surgeon: Chevis Pretty III, MD;  Location: [facility];  Service: General;  Laterality: N/A;  ? CYSTECTOMY    ? SHOULDER ARTHROSCOPY WITH ROTATOR CUFF  REPAIR Left 02/19/2022  ? Procedure: LEFT SHOULDER ARTHROSCOPY WITH ROTATOR CUFF REPAIR;  Surgeon: [name], MD;  Location: [facility];  Service: Orthopedics;  Laterality: Left;  ? SHOULDER ARTHROSCOPY WITH SUBACROMIAL DECOMPRESSION Left 02/27/2021  ? Procedure: LEFT SHOULDER ARTHROSCOPY WITH DEBRIDEMENT AND SUBACROMIAL DECOMPRESSION;  Surgeon: [name], MD;  Location: [facility];  Service: Orthopedics;  Laterality: Left;  ? ?Patient Active Problem List  ? Diagnosis Date Noted  ? Supraventricular tachycardia (HCC) 03/03/2022  ? Complete tear of left rotator cuff 02/19/2022  ? Dyspnea on exertion 11/27/2021  ? Dyslipidemia 11/27/2021  ? Irregular menses 11/25/2021  ? Infertility associated with anovulation 11/25/2021  ? H/O cold sores 11/25/2021  ? Palpitations 11/17/2021  ? Impingement syndrome of left shoulder 02/27/2021  ? Epidermoid cyst 11/04/2020  ? Candidal vulvovaginitis 11/04/2020  ? Late prenatal care in third trimester 06/23/2017  ? Supervision of high risk pregnancy, antepartum 02/15/2017  ? Advanced maternal age in multigravida, unspecified trimester 02/15/2017  ? Previous cesarean delivery, antepartum condition or complication 10/06/2014  ? 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 Missed ab 12/29/2010  ? ? ?REFERRING DIAG: S/P arthroscopy of left shoulder; R knee IT band pain ? ?THERAPY DIAG:  ?Chronic left shoulder pain ? ?Muscle weakness (  generalized) ? ?Stiffness of left shoulder, not elsewhere classified ? ?Chronic pain of right knee ? ?SUBJECTIVE:                                                                                                                                                                                     ?  ?SUBJECTIVE STATEMENT: ?Pt reports her L shoulder is doing good. Pt states she is using olive oil on her R knee and that has decreased the pain. Pt notes she is having  a MRI of her R knee next 5/11. ?  ?PAIN:  ?Are you having pain? Yes ?NPRS scale: L shoulder- 0/10 L GH area; worst:5/10 brief. R knee 0/10 ?Pain location: L GH and upper trap ?Pain orientation: Left  ?PAIN TYPE: dull, sharp, and stabbing, heavy ?Pain description: constant  ?Aggravating factors: Out of sling for shower ?Relieving factors: cold packs ? ?PERTINENT HISTORY: ?Previous arthroscopic surgery 02/27/21 ?  ?PRECAUTIONS:  ?Sling DCed on 03/25/22/ following appt c Dr. Magnus Ivan ?  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 Handles light objects ?  ?PATIENT GOALS: For my L shoulder to function as close to normal as possible ?  ?OBJECTIVE:  ?  ?DIAGNOSTIC FINDINGS:  ?IMPRESSION: L shoulder 10/10/21 ?1. Supraspinatus tendinosis with extensive irregular tear of the ?anterior and mid tendon with full-thickness components. This has ?significantly progressed compared to the previous study. Progressive ?supraspinatus muscle atrophy. ?2. Intra-articular biceps tendinosis. ?3. Moderate subacromial-subdeltoid bursitis. ?  ?PATIENT SURVEYS:  ?FOTO TBA ?  ?COGNITION: ?         Overall cognitive status: Within functional limits for tasks assessed ?                              ?SENSATION: ?         Light touch: Appears intact ?          ?  ?POSTURE: ?Forward head, rounded shoulder ?  ?UPPER EXTREMITY PROM: ?  ?A/PROM Right ?03/05/2022 Left ?03/05/2022 Left ?03/18/22 Left ?04/08/22 Left ?04/22/22  ?Shoulder flexion   120 140 tight end feel 150 155  ?Shoulder extension         ?Shoulder abduction   60 80 empty end feel 153 155  ?Shoulder adduction         ?Shoulder internal rotation         ?Shoulder external rotation   10 arm by side 40 c arm by side. Tight end feel c discomfort 58 65  ?Elbow flexion   WNLS     ?Elbow extension   WNLS     ?Wrist  flexion   WNLS     ?Wrist extension   WNLS     ?Wrist ulnar deviation         ?Wrist radial deviation         ?Wrist pronation         ?Wrist supination         ?(Blank rows =  not tested) ?  ?UPPER EXTREMITY MMT: ?Not assessed ?MMT Right ?03/05/2022 Left ?03/05/2022  ?Shoulder flexion      ?Shoulder extension      ?Shoulder abduction      ?Shoulder adduction      ?Shoulder internal rotation      ?Shoulder external rotation      ?Middle trapezius      ?Lower trapezius      ?Elbow flexion      ?Elbow extension      ?Wrist flexion      ?Wrist extension      ?Wrist ulnar deviation      ?Wrist radial deviation      ?Wrist pronation      ?Wrist supination      ?Grip strength (lbs)      ?(Blank rows = not tested) ? ?04/02/22 R Knee Assessment : ?TTP post lat R knee in area of hamstring insertion ?R Knee strength 5/5, hip strength 4-/5 (L 4+/5) ?AROM of the R knee is WNLs ? ?           ?TODAY'S TREATMENT: ?[facility] Adult PT Treatment:                                                DATE: 04/30/22 ?Therapeutic Exercise: ?UBE 4 mins L1 2 mins each direction ?Shoulder row 2x10 RTB ?Shoulder ext 2x10 RTB ?Shoulder flexion c UE ranger approx 60d of shoulder flexion 2x10 ?Shoulder IR 2x10 YTB ?Shoulder IR 2x10 YTB ?Bridging x10 ?SLR x10 ?Hip clams x10 GTB ?HEP updated ? ?[facility] Adult PT Treatment:                                                DATE: 04/24/22 ?Therapeutic Exercise: ?Bridging c static hip abd c RTB 10x2 ?SL hip clams c GTB 10x2 ?R SLR 10x2 3# ?SL hip abd 10x2 3# ?Table top AAROM for flexion, abd, and ER x10 5" ?Isometric shoulder ext x8 5" ?Isometric shoulder IR x8 5" ?Isometric shoulder flex x8 5" ?Isometric shoulder abd x8 5" ?Isometric shoulder  ER x8 5" ? ?[facility] Adult PT Treatment:                                                DATE: 04/22/22 ?Therapeutic Exercise: ?Seated hamstring stretch x3 20" ?Seated piriformis stretch x3 20" ?Supine ITB stretch c strap 3 20"  ?Bridging c static hip abd c RTB 10x2 ?HL hip abd c BluTB 10x2 ?R SLR 10x2 3# ?Table top AAROM for flexion, abd, and ER x10 5" ?Isometric shoulder ext x5 5" ?Isometric shoulder IR x5 5" ?Isometric shoulder flex x5 5" ?Isometric shoulder abd  x5 5" ?Isometric shoulder  ER x10 5" ?SL R hip  clam RTB 10x2 GTB ?  ?  ?HOME EXERCISE PROGRAM: ?Access Code: [email or web address] ?URL: [email or web address] ?Date: 04/15/2022 ?Prepared by: Daleen SquibbAllen Rall

## 2022-05-07 ENCOUNTER — Ambulatory Visit (HOSPITAL_COMMUNITY)
Admission: RE | Admit: 2022-05-07 | Discharge: 2022-05-07 | Disposition: A | Discharge status: Home / Self Care | Payer: 59 | Source: Ambulatory Visit | Attending: Orthopaedic Surgery | Admitting: Orthopaedic Surgery

## 2022-05-07 DIAGNOSIS — M25561 Pain in right knee: Secondary | ICD-10-CM | POA: Diagnosis present

## 2022-05-08 ENCOUNTER — Ambulatory Visit: Payer: 59

## 2022-05-08 DIAGNOSIS — M6281 Muscle weakness (generalized): Secondary | ICD-10-CM | POA: Diagnosis present

## 2022-05-08 DIAGNOSIS — G8929 Other chronic pain: Secondary | ICD-10-CM | POA: Diagnosis present

## 2022-05-08 DIAGNOSIS — R293 Abnormal posture: Secondary | ICD-10-CM | POA: Diagnosis present

## 2022-05-08 DIAGNOSIS — M25561 Pain in right knee: Secondary | ICD-10-CM | POA: Diagnosis present

## 2022-05-08 DIAGNOSIS — M25612 Stiffness of left shoulder, not elsewhere classified: Secondary | ICD-10-CM | POA: Diagnosis present

## 2022-05-08 DIAGNOSIS — M25512 Pain in left shoulder: Secondary | ICD-10-CM | POA: Diagnosis present

## 2022-05-08 NOTE — Therapy (Addendum)
?OUTPATIENT PHYSICAL THERAPY TREATMENT NOTE/Re-Cert ? ? ?Patient Name: Gabriela Le ?MRN: [id number] ?DOB:1976/12/01, 46 y.o., female ?Today's Date: 05/09/2022 ? ?Progress Note ?Reporting Period 03/04/22 to 04/24/22 ? ?See note below for Objective Data and Assessment of Progress/Goals.  ? ?  ? ?PCP: Olive Bass, FNP ?REFERRING PROVIDER: Olive Bass,* ? ? PT End of Session - 05/08/22 1050   ? ? Visit Number 12   ? Number of Visits 12   ? Date for PT Re-Evaluation 07/10/22   ? Authorization Type [facility] Medicaid   ? Progress Note Due on Visit 10   ? PT Start Time 1021   ? PT Stop Time 1100   ? PT Time Calculation (min) 39 min   ? Activity Tolerance Patient tolerated treatment well   ? Behavior During Therapy [facility] for tasks assessed/performed   ? ?  ?  ? ?  ? ? ? ? ? ? ? ? ? ? ? ? ?Past Medical History:  ?Diagnosis Date  ? GERD (gastroesophageal reflux disease)   ? H/O cold sores   ? ACYCLOVIR OINTMENT PRN  ? Infertility associated with anovulation   ? HAS TAKEN CLOMID IN THE PAST  ? Irregular menses   ? Missed ab 12/29/2010  ? no surgery required  ? 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 ?Past Surgical History:  ?Procedure Laterality Date  ? APPENDECTOMY    ? 6 YOA  ? CESAREAN SECTION  01/06/2013  ? Procedure: CESAREAN SECTION;  Surgeon: Hal Morales, MD;  Location: [facility];  Service: Obstetrics;  Laterality: N/A;  Primary  ? CESAREAN SECTION N/A 10/09/2014  ? Procedure: CESAREAN SECTION;  Surgeon: Konrad Felix, MD;  Location: [facility];  Service: Obstetrics;  Laterality: N/A;  ? CESAREAN SECTION N/A 08/09/2017  ? Procedure: REPEAT CESAREAN SECTION;  Surgeon: Willodean Rosenthal, MD;  Location: [facility];  Service: Obstetrics;  Laterality: N/A;  ? CHOLECYSTECTOMY N/A 11/27/2014  ? Procedure: LAPAROSCOPIC CHOLECYSTECTOMY WITH INTRAOPERATIVE CHOLANGIOGRAM;  Surgeon: Chevis Pretty III, MD;  Location: [facility];  Service: General;  Laterality: N/A;  ? CYSTECTOMY    ? SHOULDER ARTHROSCOPY WITH  ROTATOR CUFF REPAIR Left 02/19/2022  ? Procedure: LEFT SHOULDER ARTHROSCOPY WITH ROTATOR CUFF REPAIR;  Surgeon: [name], MD;  Location: [facility];  Service: Orthopedics;  Laterality: Left;  ? SHOULDER ARTHROSCOPY WITH SUBACROMIAL DECOMPRESSION Left 02/27/2021  ? Procedure: LEFT SHOULDER ARTHROSCOPY WITH DEBRIDEMENT AND SUBACROMIAL DECOMPRESSION;  Surgeon: [name], MD;  Location: [facility];  Service: Orthopedics;  Laterality: Left;  ? ?Patient Active Problem List  ? Diagnosis Date Noted  ? Supraventricular tachycardia (HCC) 03/03/2022  ? Complete tear of left rotator cuff 02/19/2022  ? Dyspnea on exertion 11/27/2021  ? Dyslipidemia 11/27/2021  ? Irregular menses 11/25/2021  ? Infertility associated with anovulation 11/25/2021  ? H/O cold sores 11/25/2021  ? Palpitations 11/17/2021  ? Impingement syndrome of left shoulder 02/27/2021  ? Epidermoid cyst 11/04/2020  ? Candidal vulvovaginitis 11/04/2020  ? Late prenatal care in third trimester 06/23/2017  ? Supervision of high risk pregnancy, antepartum 02/15/2017  ? Advanced maternal age in multigravida, unspecified trimester 02/15/2017  ? Previous cesarean delivery, antepartum condition or complication 10/06/2014  ? 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 Missed ab 12/29/2010  ? ? ?REFERRING DIAG: S/P arthroscopy of left shoulder; R knee IT band pain ? ?THERAPY DIAG:  ?Muscle weakness (generalized) ? ?Acute pain  of left shoulder ? ?Stiffness of left shoulder, not elsewhere classified ? ?Chronic pain of right knee ? ?SUBJECTIVE:                                                                                                                                                                                     ?  ?SUBJECTIVE STATEMENT: ?Pt reports her L shoulder has no pain, but yesterday it was high along the lateral GH area. A MRI was completed for the R knee  yesterday. ? ?PAIN:  ?Are you having pain? Yes ?NPRS scale: L shoulder- 0/10 L GH area; worst:8/10 brief. R knee 2/10 ?Pain location: L GH and upper trap ?Pain orientation: Left  ?PAIN TYPE: dull, sharp, and stabbing, heavy ?Pain description: constant  ?Aggravating factors: Out of sling for shower ?Relieving factors: cold packs ? ?PERTINENT HISTORY: ?Previous arthroscopic surgery 02/27/21 ?  ?PRECAUTIONS:  ?Sling DCed on 03/25/22/ following appt c Dr. Magnus Ivan ?  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 Handles light objects ?  ?PATIENT GOALS: For my L shoulder to function as close to normal as possible ?  ?OBJECTIVE:  ?  ?DIAGNOSTIC FINDINGS:  ?IMPRESSION: L shoulder 10/10/21 ?1. Supraspinatus tendinosis with extensive irregular tear of the ?anterior and mid tendon with full-thickness components. This has ?significantly progressed compared to the previous study. Progressive ?supraspinatus muscle atrophy. ?2. Intra-articular biceps tendinosis. ?3. Moderate subacromial-subdeltoid bursitis. ?  ?PATIENT SURVEYS:  ?FOTO TBA ?  ?COGNITION: ?         Overall cognitive status: Within functional limits for tasks assessed ?                              ?SENSATION: ?         Light touch: Appears intact ?          ?  ?POSTURE: ?Forward head, rounded shoulder ?  ?UPPER EXTREMITY PROM: ?  ?A/PROM Right ?03/05/2022 Left ?03/05/2022 Left ?03/18/22 Left ?04/08/22 Left ?04/22/22 Lt ?05/08/22  ?Shoulder flexion   120 140 tight end feel 150 155 A=145 c min shrug  ?Shoulder extension          ?Shoulder abduction   60 80 empty end feel 153 155   ?Shoulder adduction          ?Shoulder internal rotation          ?Shoulder external rotation   10 arm by side 40 c arm by side. Tight end feel c discomfort 58 65   ?Elbow flexion   WNLS      ?Elbow extension   WNLS      ?  Wrist flexion   WNLS      ?Wrist extension   WNLS      ?Wrist ulnar deviation          ?Wrist radial deviation          ?Wrist pronation          ?Wrist supination           ?(Blank rows = not tested) ?  ?UPPER EXTREMITY MMT: ?Not assessed ?MMT Right ?03/05/2022 Left ?03/05/2022  ?Shoulder flexion      ?Shoulder extension      ?Shoulder abduction      ?Shoulder adduction      ?Shoulder internal rotation      ?Shoulder external rotation      ?Middle trapezius      ?Lower trapezius      ?Elbow flexion      ?Elbow extension      ?Wrist flexion      ?Wrist extension      ?Wrist ulnar deviation      ?Wrist radial deviation      ?Wrist pronation      ?Wrist supination      ?Grip strength (lbs)      ?(Blank rows = not tested) ? ?04/02/22 R Knee Assessment : ?TTP post lat R knee in area of hamstring insertion ?R Knee strength 5/5, hip strength 4-/5 (L 4+/5) ?AROM of the R knee is WNLs ? ?           ?TODAY'S TREATMENT: ?[facility] Adult PT Treatment:                                                DATE: 05/08/22 ?Therapeutic Exercise: ?UBE 4 mins L1 2 mins each direction ?Isometric shoulder ext x5 5" ?Isometric shoulder IR x5 5" ?Isometric shoulder flex x5 5" ?Isometric shoulder abd x5 5" ?Isometric shoulder  ER x5 5' ?Wall slide ulnar side of hand x10 ?Shoulder row 2x10 RTB ?Shoulder ext 2x10 RTB ?Shoulder IR 2x10 YTB ?Shoulder ER 2x10 YTB ? ?[facility]                                                [facility]: 04/30/22 ?Therapeutic Exercise: ?UBE 4 mins L1 2 mins each direction ?Shoulder row 2x10 RTB ?Shoulder ext 2x10 RTB ?Shoulder flexion c UE ranger approx 60d of shoulder flexion 2x10 ?Shoulder IR 2x10 YTB ?Shoulder IR 2x10 YTB ?Bridging x10 ?SLR x10 ?Hip clams x10 GTB ?HEP updated ? ?[facility]:                                                DATE: 04/24/22 ?Therapeutic Exercise: ?Bridging c static hip abd c RTB 10x2 ?SL hip clams c GTB 10x2 ?R SLR 10x2 3# ?SL hip abd 10x2 3# ?Table top AAROM for flexion, abd, and ER x10 5" ?Isometric shoulder ext x8 5" ?Isometric shoulder IR x8 5" ?Isometric shoulder flex x8 5" ?Isometric shoulder abd x8 5" ?Isometric shoulder  ER x8 5" ?  ?HOME  EXERCISE PROGRAM: ?Access Code: [email or web address] ?URL: [email or web address] ?Date:  04/15/2022 ?Prepared by: Joellyn Rued ? ?Exercises ?- Flexion-Extension Shoulder Pendulum with Table Support  - 2 x da

## 2022-05-09 NOTE — Addendum Note (Signed)
Addended by: Joellyn Rued on: 05/09/2022 12:59 AM ? ? Modules accepted: Orders ? ?

## 2022-05-19 ENCOUNTER — Ambulatory Visit (INDEPENDENT_AMBULATORY_CARE_PROVIDER_SITE_OTHER): Payer: 59 | Admitting: Orthopaedic Surgery

## 2022-05-19 ENCOUNTER — Encounter: Payer: Self-pay | Admitting: Orthopaedic Surgery

## 2022-05-19 DIAGNOSIS — M25561 Pain in right knee: Secondary | ICD-10-CM

## 2022-05-19 MED ORDER — METHYLPREDNISOLONE ACETATE 40 MG/ML IJ SUSP
40.0000 mg | INTRAMUSCULAR | Status: AC | PRN
  Administered 2022-05-19: 40 mg via INTRA_ARTICULAR

## 2022-05-19 MED ORDER — LIDOCAINE HCL 1 % IJ SOLN
3.0000 mL | INTRAMUSCULAR | Status: AC | PRN
  Administered 2022-05-19: 3 mL

## 2022-05-19 NOTE — Progress Notes (Signed)
Office Visit Note   Patient: Gabriela Le           Date of Birth: 20-Sep-1976           MRN: [id number] Visit Date: 05/19/2022              Requested by: Olive Bass, FNP [address] [place],  [place] [address] PCP: Olive Bass, FNP   Assessment & Plan: Visit Diagnoses:  1. Acute pain of right knee     Plan: Based on her clinical exam and MRI of the right knee, there is no surgical indication for her knee and I did recommend a steroid injection.  Her and her husband agreed to this and tolerated it well we did place a steroid injection in her right knee.  Would like to see her back in 3 months to see how she is doing with her right knee but also mainly her left shoulder after having rotator cuff surgery.  All questions and concerns were answered and addressed.  Follow-Up Instructions: Return in about 3 months (around 08/19/2022).   Orders:  Orders Placed This Encounter  Procedures   Large Joint Inj   No orders of the defined types were placed in this encounter.     Procedures: Large Joint Inj: R knee on 05/19/2022 10:44 AM Indications: diagnostic evaluation and pain Details: 22 G 1.5 in needle, superolateral approach  Arthrogram: No  Medications: 3 mL lidocaine 1 %; 40 mg methylPREDNISolone acetate 40 MG/ML Outcome: tolerated well, no immediate complications Procedure, treatment alternatives, risks and benefits explained, specific risks discussed. Consent was given by the patient. Immediately prior to procedure a time out was called to verify the correct patient, procedure, equipment, support staff and site/side marked as required. Patient was prepped and draped in the usual sterile fashion.      Clinical Data: No additional findings.   Subjective: Chief Complaint  Patient presents with   Right Knee - Pain, Follow-up  The patient comes in today to go over the MRI of the right knee.  She been having some acute flareup of pain  of right knee.  She has been recovering from the left shoulder surgery.  She has never had an injection in her right knee and there is been no known injury.  She has had some global pain with the right knee recently.  HPI  Review of Systems There is currently no fever, chills, nausea, vomiting   Objective: Vital Signs: There were no vitals taken for this visit.  Physical Exam She is alert and oriented and in no acute distress Ortho Exam Examination of her right knee shows no effusion with some global tenderness and slight patellofemoral crepitation.  The knee is loosely stable with full range of motion. Specialty Comments:  No specialty comments available.  Imaging: No results found. The MRI shows no effusion with only mild arthritis in the knee with no cartilage deficits that are significant at all.  There is slight maceration of the anterior horn of the lateral meniscus but no meniscal tear of the posterior horn medial meniscus.  The ACL PCL are intact.  PMFS History: Patient Active Problem List   Diagnosis Date Noted   Supraventricular tachycardia (HCC) 03/03/2022   Complete tear of left rotator cuff 02/19/2022   Dyspnea on exertion 11/27/2021   Dyslipidemia 11/27/2021   Irregular menses 11/25/2021   Infertility associated with anovulation 11/25/2021   H/O cold sores 11/25/2021   Palpitations  11/17/2021   Impingement syndrome of left shoulder 02/27/2021   Epidermoid cyst 11/04/2020   Candidal vulvovaginitis 11/04/2020   Late prenatal care in third trimester 06/23/2017   Supervision of high risk pregnancy, antepartum 02/15/2017   Advanced maternal age in multigravida, unspecified trimester 02/15/2017   Previous cesarean delivery, antepartum condition or complication 10/06/2014   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   Missed ab 12/29/2010   Past Medical History:  Diagnosis Date   GERD (gastroesophageal reflux disease)     H/O cold sores    ACYCLOVIR OINTMENT PRN   Infertility associated with anovulation    HAS TAKEN CLOMID IN THE PAST   Irregular menses    Missed ab 12/29/2010   no surgery required   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    Family History  Problem Relation Age of Onset   Heart disease Mother        PALPITATIONS   Hypertension Mother     Past Surgical History:  Procedure Laterality Date   APPENDECTOMY     6 YOA   CESAREAN SECTION  01/06/2013   Procedure: CESAREAN SECTION;  Surgeon: Hal Morales, MD;  Location: [facility];  Service: Obstetrics;  Laterality: N/A;  Primary   CESAREAN SECTION N/A 10/09/2014   Procedure: CESAREAN SECTION;  Surgeon: Konrad Felix, MD;  Location: [facility];  Service: Obstetrics;  Laterality: N/A;   CESAREAN SECTION N/A 08/09/2017   Procedure: REPEAT CESAREAN SECTION;  Surgeon: Willodean Rosenthal, MD;  Location: [facility];  Service: Obstetrics;  Laterality: N/A;   CHOLECYSTECTOMY N/A 11/27/2014   Procedure: LAPAROSCOPIC CHOLECYSTECTOMY WITH INTRAOPERATIVE CHOLANGIOGRAM;  Surgeon: Chevis Pretty III, MD;  Location: [facility];  Service: General;  Laterality: N/A;   CYSTECTOMY     SHOULDER ARTHROSCOPY WITH ROTATOR CUFF REPAIR Left 02/19/2022   Procedure: LEFT SHOULDER ARTHROSCOPY WITH ROTATOR CUFF REPAIR;  Surgeon: [name], MD;  Location: [facility];  Service: Orthopedics;  Laterality: Left;   SHOULDER ARTHROSCOPY WITH SUBACROMIAL DECOMPRESSION Left 02/27/2021   Procedure: LEFT SHOULDER ARTHROSCOPY WITH DEBRIDEMENT AND SUBACROMIAL DECOMPRESSION;  Surgeon: [name], MD;  Location: [facility];  Service: Orthopedics;  Laterality: Left;   Social History   Occupational History   Occupation: STUDENT    Comment: [employer]  Tobacco Use   Smoking status: Never   Smokeless tobacco: Never  Vaping Use   Vaping Use: Never used  Substance and Sexual Activity   Alcohol use: No   Drug use: No   Sexual activity: Yes     Partners: Male    Birth control/protection: None

## 2022-05-22 ENCOUNTER — Ambulatory Visit: Payer: 59

## 2022-05-22 DIAGNOSIS — R293 Abnormal posture: Secondary | ICD-10-CM

## 2022-05-22 DIAGNOSIS — M25612 Stiffness of left shoulder, not elsewhere classified: Secondary | ICD-10-CM

## 2022-05-22 DIAGNOSIS — G8929 Other chronic pain: Secondary | ICD-10-CM

## 2022-05-22 DIAGNOSIS — M6281 Muscle weakness (generalized): Secondary | ICD-10-CM

## 2022-05-22 DIAGNOSIS — M25512 Pain in left shoulder: Secondary | ICD-10-CM

## 2022-05-22 NOTE — Therapy (Signed)
OUTPATIENT PHYSICAL THERAPY TREATMENT NOTE/Re-Cert   Patient Name: Gabriela Le MRN: [id number] DOB:11/09/1976, 46 y.o., female Today's Date: 05/22/2022  Progress Note Reporting Period 03/04/22 to 04/24/22  See note below for Objective Data and Assessment of Progress/Goals.      PCP: Marrian Salvage, [place] REFERRING PROVIDER: Marrian Salvage,*   PT End of Session - 05/22/22 1020     Visit Number 13    Number of Visits 28    Date for PT Re-Evaluation 07/10/22    Progress Note Due on Visit 10    PT Start Time 1018    PT Stop Time 1101    PT Time Calculation (min) 43 min    Activity Tolerance Patient tolerated treatment well    Behavior During Therapy WFL for tasks assessed/performed                        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   Past Surgical History:  Procedure Laterality Date   APPENDECTOMY     6 YOA   CESAREAN SECTION  01/06/2013   Procedure: CESAREAN SECTION;  Surgeon: Eldred Manges, MD;  Location: [facility];  Service: Obstetrics;  Laterality: N/A;  Primary   CESAREAN SECTION N/A 10/09/2014   Procedure: CESAREAN SECTION;  Surgeon: Alinda Dooms, MD;  Location: [facility];  Service: Obstetrics;  Laterality: N/A;   CESAREAN SECTION N/A 08/09/2017   Procedure: REPEAT CESAREAN SECTION;  Surgeon: Lavonia Drafts, MD;  Location: [facility];  Service: Obstetrics;  Laterality: N/A;   CHOLECYSTECTOMY N/A 11/27/2014   Procedure: LAPAROSCOPIC CHOLECYSTECTOMY WITH INTRAOPERATIVE CHOLANGIOGRAM;  Surgeon: Autumn Messing III, MD;  Location: [facility];  Service: General;  Laterality: N/A;   CYSTECTOMY     SHOULDER ARTHROSCOPY WITH ROTATOR CUFF REPAIR Left 02/19/2022   Procedure: LEFT  SHOULDER ARTHROSCOPY WITH ROTATOR CUFF REPAIR;  Surgeon: Mcarthur Rossetti, MD;  Location: [facility];  Service: Orthopedics;  Laterality: Left;   SHOULDER ARTHROSCOPY WITH SUBACROMIAL DECOMPRESSION Left 02/27/2021   Procedure: LEFT SHOULDER ARTHROSCOPY WITH DEBRIDEMENT AND SUBACROMIAL DECOMPRESSION;  Surgeon: Mcarthur Rossetti, MD;  Location: [facility];  Service: Orthopedics;  Laterality: Left;   Patient Active Problem List   Diagnosis Date Noted   Supraventricular tachycardia (Mariano Colon) 03/03/2022   Complete tear of left rotator cuff 02/19/2022   Dyspnea on exertion 11/27/2021   Dyslipidemia 11/27/2021   Irregular menses 11/25/2021   Infertility associated with anovulation 11/25/2021   H/O cold sores 11/25/2021   Palpitations 11/17/2021   Impingement syndrome of left shoulder 02/27/2021   Epidermoid cyst 11/04/2020   Candidal vulvovaginitis 11/04/2020   Late prenatal care in third trimester 06/23/2017   Supervision of high risk pregnancy, antepartum 02/15/2017   Advanced maternal age in multigravida, unspecified trimester 02/15/2017   Previous cesarean delivery, antepartum condition or complication 65/78/4696   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 Pt reports L shoulder is better. She has scapular pain at time, esp at work.   PAIN:  Are you having pain?  Yes NPRS scale: L shoulder- 0/10 L GH area; worst:8/10 brief. R knee 0/10 Pain location: L GH and upper trap Pain orientation: Left  PAIN TYPE: dull, sharp, and stabbing, heavy Pain description: constant  Aggravating factors: Out of sling for shower Relieving factors: cold packs  PERTINENT HISTORY: Previous arthroscopic surgery 02/27/21   PRECAUTIONS:  Sling DCed on 03/25/22/ following appt c Dr. 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 Handles light objects   PATIENT GOALS: For my L shoulder to function as close to normal as possible   OBJECTIVE:    DIAGNOSTIC FINDINGS:  IMPRESSION: L shoulder 10/10/21 1. Supraspinatus tendinosis with extensive irregular tear of the anterior and mid tendon with full-thickness components. This has significantly progressed compared to the previous study. Progressive supraspinatus muscle atrophy. 2. Intra-articular biceps tendinosis. 3. Moderate subacromial-subdeltoid bursitis.  MRI R knee 05/10/22: Lateral meniscus: The anterior horn is degenerated and torn and has a macerated appearance. The posterior horn is intact.   PATIENT SURVEYS:  FOTO TBA   COGNITION:          Overall cognitive status: Within functional limits for tasks assessed                               SENSATION:          Light touch: Appears intact             POSTURE: Forward head, rounded shoulder   UPPER EXTREMITY PROM:   A/PROM Right 03/05/2022 Left 03/05/2022 Left 03/18/22 Left 04/08/22 Left 04/22/22 Lt 05/08/22  Shoulder flexion   120 140 tight end feel 150 155 A=145 c min shrug  Shoulder extension          Shoulder abduction   60 80 empty end feel 153 155   Shoulder adduction          Shoulder internal rotation          Shoulder external rotation   10 arm by side 40 c arm by side. Tight end feel c discomfort 58 65   Elbow flexion  WNLS      Elbow extension   WNLS      Wrist flexion   WNLS      Wrist extension   WNLS      Wrist  ulnar deviation          Wrist radial deviation          Wrist pronation          Wrist supination          (Blank rows = not tested)   UPPER EXTREMITY MMT: Not assessed MMT Right 03/05/2022 Left 03/05/2022  Shoulder flexion      Shoulder extension      Shoulder abduction      Shoulder adduction      Shoulder internal rotation      Shoulder external rotation      Middle trapezius      Lower trapezius      Elbow flexion      Elbow extension      Wrist flexion      Wrist extension      Wrist ulnar deviation      Wrist radial deviation      Wrist pronation      Wrist supination      Grip strength (lbs)      (Blank rows = not tested)  04/02/22 R Knee Assessment : TTP post lat R knee in area of hamstring insertion R Knee strength 5/5, hip strength 4-/5 (L 4+/5) AROM of the R knee is WNLs             TODAY'S TREATMENT: [facility] Adult PT Treatment:                                                DATE: 05/22/22 Therapeutic Exercise: Shoulder flex to 90d 1# 2x10 Shoulder scaption 90 0# 2x10 SL L shoulder ER 1# 3x10 Supine L shoulder perturbations at 90d 3x1 min  Supine serratus press outs 2x10 2#L 3#R Shoulder pull overs c dowel x15 Shoulder row  2x10 RTB Shoulder ext 2x10 RTB  Manual Therapy: *** Neuromuscular re-ed: *** Therapeutic Activity: *** Modalities: *** Self Care: ***  [facility] Adult PT Treatment:                                                DATE: 05/08/22 Therapeutic Exercise: UBE 4 mins L1 2 mins each direction Isometric shoulder ext x5 5" Isometric shoulder IR x5 5" Isometric shoulder flex x5 5" Isometric shoulder abd x5 5" Isometric shoulder  ER x5 5' Wall slide ulnar side of hand x10 Shoulder row 2x10 RTB Shoulder ext 2x10 RTB Shoulder IR 2x10 YTB Shoulder ER 2x10 YTB  [facility] Adult PT Treatment:                                                DATE: 04/30/22 Therapeutic Exercise: UBE 4 mins L1 2 mins each direction Shoulder row 2x10 RTB Shoulder ext 2x10  RTB Shoulder flexion c UE ranger approx 60d of shoulder flexion 2x10 Shoulder IR 2x10 YTB Shoulder IR 2x10 YTB Bridging  x10 SLR x10 Hip clams x10 GTB HEP updated  [facility] Adult PT Treatment:                                                DATE: 04/24/22 Therapeutic Exercise: Bridging c static hip abd c RTB 10x2 SL hip clams c GTB 10x2 R SLR 10x2 3# SL hip abd 10x2 3# Table top AAROM for flexion, abd, and ER x10 5" Isometric shoulder ext x8 5" Isometric shoulder IR x8 5" Isometric shoulder flex x8 5" Isometric shoulder abd x8 5" Isometric shoulder  ER x8 5"   HOME EXERCISE PROGRAM: Access Code: [email or web address] URL: [email or web address] Date: 04/15/2022 Prepared by: Gar Ponto  Exercises - Flexion-Extension Shoulder Pendulum with Table Support  - 2 x daily - 7 x weekly - 2 sets - 15 reps - Horizontal Shoulder Pendulum with Table Support  - 2 x daily - 7 x weekly - 2 sets - 15 reps - Supine Shoulder Flexion Extension AAROM with Dowel  - 2 x daily - 7 x weekly - 1 sets - 10 reps - 5 hold - Supine Shoulder External Rotation with Dowel  - 2 x daily - 7 x weekly - 1 sets - 10 reps - 5 hold - Seated Shoulder Flexion Towel Slide at Table Top  - 1 x daily - 7 x weekly - 1 sets - 10 reps - 5 hold - Seated Shoulder Abduction Towel Slide at Table Top  - 1 x daily - 7 x weekly - 1 sets - 10 reps - 5 hold - Seated Shoulder External Rotation PROM on Table  - 1 x daily - 7 x weekly - 1 sets - 10 reps - 5 hold - Standing Isometric Shoulder Internal Rotation at Doorway  - 2 x daily - 7 x weekly - 1 sets - 10 reps - 5 hold - Standing Isometric Shoulder Extension with Doorway - Arm Bent  - 2 x daily - 7 x weekly - 1 sets - 10 reps - 5 hold - Seated Hamstring Stretch  - 1 x daily - 7 x weekly - 1 sets - 3 reps - 20 hold - Seated Piriformis Stretch (Mirrored)  - 1 x daily - 7 x weekly - 1 sets - 3 reps - 20 hold - Supine ITB Stretch with Strap  - 1 x daily - 7 x weekly - 3 sets - 3 reps - 20  hold - Sidelying Hip Abduction  - 1 x daily - 7 x weekly - 2-3 sets - 10 reps - 3 hold - Supine Bridge  - 1 x daily - 7 x weekly - 2-3 sets - 10 reps - 3 hold - Clamshell with Resistance  - 1 x daily - 7 x weekly - 2-3 sets - 10 reps - 3 hold     ASSESSMENT:   CLINICAL IMPRESSION: PT was completed for strengthening of the L shoulder rotator cuff and periscapular muscles. With L shoulder AROM flexion, shrug is minimal. Pt is not able to complete AROM for L shoulder abd. Light weight resistive strengthening was iniitaited for the L shoulder. Pt tolerated without adverse effects. MRI of the R knee revealed: Lateral meniscus: The anterior horn is degenerated and torn and has a macerated appearance. The posterior horn is intact. Pt rpeorts she is not    Pt's L  shoulder  continues to improve with pt activiely being able to raise to 145d x 1 rep with min shoulder shrug. Pt is making appropriate progress with PT re: PROM for the L shoulder, pain, and with the recent initiation of strengthening. Awaiting results of R knee MRI. Pt will continue to benefit from skilled PT to increase L shoulder strength as per rotator cuff surgery protocol and to address R LE impairments to optimize the function of the pt's L UE and R LE.   OBJECTIVE IMPAIRMENTS decreased activity tolerance, decreased ROM, decreased strength, impaired UE functional use, and pain.     GOALS:   SHORT TERM GOALS:   Pt will be ind in an initail HEP Baseline: strated on eval Target date: 03/26/2022 Goal status: MET   2.  Increase L shoulder PROM to flex 140, ER c arm by side to 40, and abd s rotation to 80d. 04/08/22=150,155, and 58 respectively Baseline: 120, 10, 60 respectively Target date: 04/02/2022 Goal status: MET   LONG TERM GOALS:   Pt will demonstrate PROM of the L shoulder 80% of the R Baseline: see flow sheets Target date: 07/10/22 Goal status: IMproving   2.  Pt will be able to complete AAROM exs indly Baseline:   Target date: 07/10/22 Goal status: Met   3.  Pt will be ind in an advanced HEP Baseline:  Target date: 07/10/22 Goal status: Progressing   4.  Develop progressive goals for the L shoulder as appropriate per shoulder rotator cuff protocol  Baseline:  Target date: 07/10/22 Goal status: Ongoing  5. Increase R hip strength to 4+/5 for improved function.  Baseline: 4-  Target date: 05/08/22  Goal Status: Deferred awaiting MRI R knee  6. Pt will report a decrease in R knee pain with daily activities to 2/10 or less. 05/08/22=0-2/10    Baseline: 5/10   Target date: 07/10/22   Goal Status: Improving PLAN: PT FREQUENCY: 2x per week   PT DURATION: 8 weeks   PLANNED INTERVENTIONS: Therapeutic exercises, Therapeutic activity, Neuromuscular re-education, Patient/Family education, Joint mobilization, Dry Needling, Electrical stimulation, Cryotherapy, Moist heat, Taping, Vasopneumatic device, Ultrasound, Ionotophoresis 68m/ml Dexamethasone, and Manual therapy   PLAN FOR NEXT SESSION: Initiate light weight L shoulder strengthening for HEP if tolerated in clinic. Review HEP for R LE strengthening. Will provide clinic care as needed. R knee pain has resolved following injection..Zenia ResidesRalls MS, PT 05/22/22 1:53 PM

## 2022-05-27 ENCOUNTER — Ambulatory Visit: Payer: 59 | Admitting: Physical Therapy

## 2022-05-27 ENCOUNTER — Encounter: Payer: Self-pay | Admitting: Physical Therapy

## 2022-05-27 DIAGNOSIS — M25512 Pain in left shoulder: Secondary | ICD-10-CM

## 2022-05-27 DIAGNOSIS — M6281 Muscle weakness (generalized): Secondary | ICD-10-CM

## 2022-05-27 DIAGNOSIS — M25612 Stiffness of left shoulder, not elsewhere classified: Secondary | ICD-10-CM

## 2022-05-27 NOTE — Therapy (Signed)
OUTPATIENT PHYSICAL THERAPY TREATMENT NOTE   Patient Name: Gabriela Le MRN: [id number] DOB:07/31/1976, 46 y.o., female Today's Date: 05/27/2022       PCP: Marrian Salvage, [place] REFERRING PROVIDER: Mcarthur Rossetti*     PT End of Session - 05/27/22 1020     Visit Number 14    Number of Visits 28    Date for PT Re-Evaluation 07/10/22    Authorization Type [employer]    Progress Note Due on Visit 20    PT Start Time 1020    PT Stop Time 1058    PT Time Calculation (min) 38 min            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   Past Surgical History:  Procedure Laterality Date   APPENDECTOMY     6 YOA   CESAREAN SECTION  01/06/2013   Procedure: CESAREAN SECTION;  Surgeon: Eldred Manges, MD;  Location: [facility];  Service: Obstetrics;  Laterality: N/A;  Primary   CESAREAN SECTION N/A 10/09/2014   Procedure: CESAREAN SECTION;  Surgeon: Alinda Dooms, MD;  Location: [facility];  Service: Obstetrics;  Laterality: N/A;   CESAREAN SECTION N/A 08/09/2017   Procedure: REPEAT CESAREAN SECTION;  Surgeon: Lavonia Drafts, MD;  Location: [place];  Service: Obstetrics;  Laterality: N/A;   CHOLECYSTECTOMY N/A 11/27/2014   Procedure: LAPAROSCOPIC CHOLECYSTECTOMY WITH INTRAOPERATIVE CHOLANGIOGRAM;  Surgeon: Autumn Messing III, MD;  Location: [facility];  Service: General;  Laterality: N/A;   CYSTECTOMY     SHOULDER ARTHROSCOPY WITH ROTATOR CUFF REPAIR Left 02/19/2022   Procedure: LEFT SHOULDER ARTHROSCOPY WITH ROTATOR CUFF REPAIR;  Surgeon: Mcarthur Rossetti, MD;  Location: [place];  Service: Orthopedics;  Laterality: Left;   SHOULDER ARTHROSCOPY WITH SUBACROMIAL  DECOMPRESSION Left 02/27/2021   Procedure: LEFT SHOULDER ARTHROSCOPY WITH DEBRIDEMENT AND SUBACROMIAL DECOMPRESSION;  Surgeon: Mcarthur Rossetti, MD;  Location: [place];  Service: Orthopedics;  Laterality: Left;   Patient Active Problem List   Diagnosis Date Noted   Supraventricular tachycardia ([place]) 03/03/2022   Complete tear of left rotator cuff 02/19/2022   Dyspnea on exertion 11/27/2021   Dyslipidemia 11/27/2021   Irregular menses 11/25/2021   Infertility associated with anovulation 11/25/2021   H/O cold sores 11/25/2021   Palpitations 11/17/2021   Impingement syndrome of left shoulder 02/27/2021   Epidermoid cyst 11/04/2020   Candidal vulvovaginitis 11/04/2020   Late prenatal care in third trimester 06/23/2017   Supervision of high risk pregnancy, antepartum 02/15/2017   Advanced maternal age in multigravida, unspecified trimester 02/15/2017   Previous cesarean delivery, antepartum condition or complication 10/29/1116   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  SUBJECTIVE:  SUBJECTIVE STATEMENT: Pt reports her R knee pain resolved after an injection. Pt reports minimal soreness in left shoulder after last session. No pain.   PAIN:  Are you having pain? No NPRS scale: L shoulder- 0/10 L GH area; worst:8/10 brief. R knee 0/10 Pain location: L GH and upper trap Pain orientation: Left  PAIN TYPE: dull, sharp, and stabbing, heavy Pain description: constant  Aggravating factors: Out of sling for shower Relieving factors: cold packs  PERTINENT HISTORY: Previous arthroscopic surgery 02/27/21    PRECAUTIONS:  Sling DCed on 03/25/22/ following appt c Dr. 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 Supraspinatus tendinosis with extensive irregular tear of the anterior and mid tendon with full-thickness components. This has significantly progressed compared to the previous study. Progressive supraspinatus muscle atrophy. 2. Intra-articular biceps tendinosis. 3. Moderate subacromial-subdeltoid bursitis.  MRI R knee 05/10/22: Lateral meniscus: The anterior horn is degenerated and torn and has a macerated appearance. The posterior horn is intact.   PATIENT SURVEYS:  FOTO TBA   COGNITION:          Overall cognitive status: Within functional limits for tasks assessed                               SENSATION:          Light touch: Appears intact             POSTURE: Forward head, rounded shoulder   UPPER EXTREMITY PROM:   A/PROM Right 03/05/2022 Left 03/05/2022 Left 03/18/22 Left 04/08/22 Left 04/22/22 Lt 05/08/22  Shoulder flexion   120 140 tight end feel 150 155 A=145 c min shrug  Shoulder extension          Shoulder abduction   60 80 empty end feel 153 155   Shoulder adduction          Shoulder internal rotation          Shoulder external rotation   10 arm by side 40 c arm by side. Tight end feel c discomfort 58 65   Elbow flexion   WNLS      Elbow extension   WNLS      Wrist flexion   WNLS      Wrist extension   WNLS      Wrist ulnar deviation          Wrist radial deviation          Wrist pronation          Wrist supination          (Blank rows = not tested)   UPPER EXTREMITY MMT: Not assessed MMT Right 03/05/2022 Left 03/05/2022  Shoulder flexion      Shoulder extension      Shoulder abduction      Shoulder adduction      Shoulder internal rotation       Shoulder external rotation      Middle trapezius      Lower trapezius      Elbow flexion      Elbow extension      Wrist flexion      Wrist extension      Wrist ulnar deviation        Wrist radial deviation      Wrist pronation      Wrist supination      Grip strength (lbs)      (Blank rows = not tested)  04/02/22 R Knee Assessment : TTP post lat R knee in area of hamstring insertion R Knee strength 5/5, hip strength 4-/5 (L 4+/5) AROM of the R knee is WNLs             TODAY'S TREATMENT: [facility] Adult PT Treatment:                                                DATE: 05/27/22 Therapeutic Exercise: UBE Level 1 2 min each way Shoulder row  x20 RTB Shoulder ext x 20 RTB Shoulder IR x 20 RTB Shoulder ER x 10 x 5 x 5 RTB Standing shoulder wall slides flexion and abduction x 10 each  Supine shoulder flexion to 90d , long arm x 10  Supine L shoulder perturbations at 90d 90 sec x 1  S/L shoulder abduction x 10  AROM SL L shoulder ER 1# 3x10 SUpine serratus press 1# x 15 Left  Passive left shoulder ROM flexion, abduction, ER, IR in scapular plane  [facility] Adult PT Treatment:                                                DATE: 05/22/22 Therapeutic Exercise: Shoulder flex to 90d 1# 2x10 Shoulder scaption 90 0# 2x10 SL L shoulder ER 1# 3x10 Supine L shoulder perturbations at 90d 3x1 min  Supine serratus press outs 2x10 2#L 3#R Shoulder pull overs c dowel x15 Shoulder row  2x10 RTB Shoulder ext 2x10 RTB  [facility] Adult PT Treatment:                                                DATE: 05/08/22 Therapeutic Exercise: UBE 4 mins L1 2 mins each direction Isometric shoulder ext x5 5" Isometric shoulder IR x5 5" Isometric shoulder flex x5 5" Isometric shoulder abd x5 5" Isometric shoulder  ER x5 5' Wall slide ulnar side of hand x10 Shoulder row 2x10 RTB Shoulder ext 2x10 RTB Shoulder IR 2x10 YTB Shoulder ER 2x10 YTB  [facility] Adult PT Treatment:                                                 DATE: 04/30/22 Therapeutic Exercise: UBE 4 mins L1 2 mins each direction Shoulder row 2x10 RTB Shoulder ext 2x10 RTB Shoulder flexion c UE ranger approx 60d of shoulder flexion 2x10 Shoulder IR 2x10 YTB Shoulder IR 2x10 YTB Bridging x10 SLR x10 Hip clams x10 GTB HEP updated   HOME EXERCISE PROGRAM: Access Code: [email or web address] URL: [email or web address] Date: 04/15/2022 Prepared by: Gar Ponto  Exercises - Flexion-Extension Shoulder Pendulum with Table Support  - 2 x daily - 7 x weekly - 2 sets - 15 reps - Horizontal Shoulder Pendulum with Table Support  -  2 x daily - 7 x weekly - 2 sets - 15 reps - Supine Shoulder Flexion Extension AAROM with Dowel  - 2 x daily - 7 x weekly - 1 sets - 10 reps - 5 hold - Supine Shoulder External Rotation with Dowel  - 2 x daily - 7 x weekly - 1 sets - 10 reps - 5 hold - Seated Shoulder Flexion Towel Slide at Table Top  - 1 x daily - 7 x weekly - 1 sets - 10 reps - 5 hold - Seated Shoulder Abduction Towel Slide at Table Top  - 1 x daily - 7 x weekly - 1 sets - 10 reps - 5 hold - Seated Shoulder External Rotation PROM on Table  - 1 x daily - 7 x weekly - 1 sets - 10 reps - 5 hold - Standing Isometric Shoulder Internal Rotation at Doorway  - 2 x daily - 7 x weekly - 1 sets - 10 reps - 5 hold - Standing Isometric Shoulder Extension with Doorway - Arm Bent  - 2 x daily - 7 x weekly - 1 sets - 10 reps - 5 hold - Seated Hamstring Stretch  - 1 x daily - 7 x weekly - 1 sets - 3 reps - 20 hold - Seated Piriformis Stretch (Mirrored)  - 1 x daily - 7 x weekly - 1 sets - 3 reps - 20 hold - Supine ITB Stretch with Strap  - 1 x daily - 7 x weekly - 3 sets - 3 reps - 20 hold - Sidelying Hip Abduction  - 1 x daily - 7 x weekly - 2-3 sets - 10 reps - 3 hold - Supine Bridge  - 1 x daily - 7 x weekly - 2-3 sets - 10 reps - 3 hold - Clamshell with Resistance  - 1 x daily - 7 x weekly - 2-3 sets - 10 reps - 3 hold     ASSESSMENT:   CLINICAL IMPRESSION: PT was  completed for strengthening of the L shoulder rotator cuff and periscapular muscles. She reports min soreness in shoulder post therapy last session. She fatigues with repetitions and has some catching with eccentric lowering in standing and supine. Her left upper trap remains tight.  Pt tolerated without adverse effects. . Pt will continue to benefit from skilled PT to address L shoulder strength deficits as per rotator cuff repair protocol.  OBJECTIVE IMPAIRMENTS decreased activity tolerance, decreased ROM, decreased strength, impaired UE functional use, and pain.     GOALS:   SHORT TERM GOALS:   Pt will be ind in an initail HEP Baseline: strated on eval Target date: 03/26/2022 Goal status: MET   2.  Increase L shoulder PROM to flex 140, ER c arm by side to 40, and abd s rotation to 80d. 04/08/22=150,155, and 58 respectively Baseline: 120, 10, 60 respectively Target date: 04/02/2022 Goal status: MET   LONG TERM GOALS:   Pt will demonstrate PROM of the L shoulder 80% of the R Baseline: see flow sheets Target date: 07/10/22 Goal status: IMproving   2.  Pt will be able to complete AAROM exs indly Baseline:  Target date: 07/10/22 Goal status: Met   3.  Pt will be ind in an advanced HEP Baseline:  Target date: 07/10/22 Goal status: Progressing   4.  Develop progressive goals for the L shoulder as appropriate per shoulder rotator cuff protocol  Baseline:  Target date: 07/10/22 Goal status: Ongoing  5. Increase R   hip strength to 4+/5 for improved function.  Baseline: 4-  Target date: 05/08/22  Goal Status: Deferred awaiting MRI R knee  6. Pt will report a decrease in R knee pain with daily activities to 2/10 or less. 05/08/22=0-2/10    Baseline: 5/10   Target date: 07/10/22   Goal Status: Improving PLAN: PT FREQUENCY: 2x per week   PT DURATION: 8 weeks   PLANNED INTERVENTIONS: Therapeutic exercises, Therapeutic activity, Neuromuscular re-education, Patient/Family education, Joint  mobilization, Dry Needling, Electrical stimulation, Cryotherapy, Moist heat, Taping, Vasopneumatic device, Ultrasound, Ionotophoresis 4mg/ml Dexamethasone, and Manual therapy   PLAN FOR NEXT SESSION: Initiate light weight L shoulder strengthening for HEP if tolerated in clinic. Review HEP for R LE strengthening. Will provide care as needed.for the R knee pain as indicated. R knee painhas resolved following an injection..   Jessica Donoho, PTA 05/27/22 10:57 AM Phone: [phone] Fax: [phone]      

## 2022-05-29 ENCOUNTER — Other Ambulatory Visit: Payer: Self-pay | Admitting: Family

## 2022-05-29 MED ORDER — FLUTICASONE PROPIONATE 50 MCG/ACT NA SUSP: 2.0000 | Freq: Every day | NASAL | 6 refills | Status: DC

## 2022-06-02 ENCOUNTER — Encounter: Payer: Self-pay | Admitting: Physical Therapy

## 2022-06-02 ENCOUNTER — Ambulatory Visit: Payer: 59 | Attending: Orthopaedic Surgery | Admitting: Physical Therapy

## 2022-06-02 DIAGNOSIS — M6281 Muscle weakness (generalized): Secondary | ICD-10-CM | POA: Insufficient documentation

## 2022-06-02 DIAGNOSIS — M25561 Pain in right knee: Secondary | ICD-10-CM | POA: Diagnosis present

## 2022-06-02 DIAGNOSIS — G8929 Other chronic pain: Secondary | ICD-10-CM | POA: Insufficient documentation

## 2022-06-02 DIAGNOSIS — M25612 Stiffness of left shoulder, not elsewhere classified: Secondary | ICD-10-CM | POA: Insufficient documentation

## 2022-06-02 DIAGNOSIS — M25512 Pain in left shoulder: Secondary | ICD-10-CM | POA: Diagnosis present

## 2022-06-02 NOTE — Therapy (Signed)
OUTPATIENT PHYSICAL THERAPY TREATMENT NOTE   Patient Name: Gabriela Le MRN: [id number] DOB:07-04-1976, 46 y.o., female Today's Date: 06/02/2022       PCP: Marrian Salvage, [place] REFERRING PROVIDER: Mcarthur Rossetti*     PT End of Session - 06/02/22 1019     Visit Number 15    Number of Visits 28    Date for PT Re-Evaluation 07/10/22    Authorization Type [employer]    PT Start Time 3295    PT Stop Time 1056    PT Time Calculation (min) 41 min            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   Past Surgical History:  Procedure Laterality Date   APPENDECTOMY     6 YOA   CESAREAN SECTION  01/06/2013   Procedure: CESAREAN SECTION;  Surgeon: Eldred Manges, MD;  Location: [facility];  Service: Obstetrics;  Laterality: N/A;  Primary   CESAREAN SECTION N/A 10/09/2014   Procedure: CESAREAN SECTION;  Surgeon: Alinda Dooms, MD;  Location: [facility];  Service: Obstetrics;  Laterality: N/A;   CESAREAN SECTION N/A 08/09/2017   Procedure: REPEAT CESAREAN SECTION;  Surgeon: Lavonia Drafts, MD;  Location: [facility];  Service: Obstetrics;  Laterality: N/A;   CHOLECYSTECTOMY N/A 11/27/2014   Procedure: LAPAROSCOPIC CHOLECYSTECTOMY WITH INTRAOPERATIVE CHOLANGIOGRAM;  Surgeon: Autumn Messing III, MD;  Location: [facility];  Service: General;  Laterality: N/A;   CYSTECTOMY     SHOULDER ARTHROSCOPY WITH ROTATOR CUFF REPAIR Left 02/19/2022   Procedure: LEFT SHOULDER ARTHROSCOPY WITH ROTATOR CUFF REPAIR;  Surgeon: Mcarthur Rossetti, MD;  Location: [facility];  Service: Orthopedics;  Laterality: Left;   SHOULDER ARTHROSCOPY WITH SUBACROMIAL DECOMPRESSION Left 02/27/2021   Procedure:  LEFT SHOULDER ARTHROSCOPY WITH DEBRIDEMENT AND SUBACROMIAL DECOMPRESSION;  Surgeon: Mcarthur Rossetti, MD;  Location: [facility];  Service: Orthopedics;  Laterality: Left;   Patient Active Problem List   Diagnosis Date Noted   Supraventricular tachycardia ([place]) 03/03/2022   Complete tear of left rotator cuff 02/19/2022   Dyspnea on exertion 11/27/2021   Dyslipidemia 11/27/2021   Irregular menses 11/25/2021   Infertility associated with anovulation 11/25/2021   H/O cold sores 11/25/2021   Palpitations 11/17/2021   Impingement syndrome of left shoulder 02/27/2021   Epidermoid cyst 11/04/2020   Candidal vulvovaginitis 11/04/2020   Late prenatal care in third trimester 06/23/2017   Supervision of high risk pregnancy, antepartum 02/15/2017   Advanced maternal age in multigravida, unspecified trimester 02/15/2017   Previous cesarean delivery, antepartum condition or complication 18/84/1660   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   PAIN:  Are you having pain? No NPRS scale: L shoulder- 0/10 L GH area; worst:8/10 brief. R knee 0/10 Pain location: L GH and upper trap Pain orientation: Left  PAIN TYPE: dull, sharp, and stabbing, heavy Pain description: constant  Aggravating factors: Out of sling for shower Relieving factors: cold packs  PERTINENT HISTORY: Previous arthroscopic surgery 02/27/21   PRECAUTIONS:  Sling DCed on 03/25/22/  following appt c Dr. 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 Handles light objects   PATIENT GOALS: For my L shoulder to function as close to normal as possible   OBJECTIVE:    DIAGNOSTIC FINDINGS:  IMPRESSION: L shoulder 10/10/21 1. Supraspinatus tendinosis with extensive irregular tear of the anterior and mid tendon with full-thickness components. This has significantly progressed compared to the previous study. Progressive supraspinatus muscle atrophy. 2. Intra-articular biceps tendinosis. 3. Moderate subacromial-subdeltoid bursitis.  MRI R knee 05/10/22: Lateral meniscus: The anterior horn is degenerated and torn and has a macerated appearance. The posterior horn is intact.   PATIENT SURVEYS:  FOTO TBA   COGNITION:          Overall cognitive status: Within functional limits for tasks assessed                               SENSATION:          Light touch: Appears intact             POSTURE: Forward head, rounded shoulder   UPPER EXTREMITY PROM:   A/PROM Right 03/05/2022 Left 03/05/2022 Left 03/18/22 Left 04/08/22 Left 04/22/22 Lt 05/08/22 LT 06/02/22  Shoulder flexion   120 140 tight end feel 150 155 A=145 c min shrug 140  Shoulder extension           Shoulder abduction   60 80 empty end feel 153 155  120  Shoulder adduction           Shoulder internal rotation         Reaches Lower thoracic  Shoulder external rotation   10 arm by side 40 c arm by side. Tight end feel c discomfort 58 65  Reaches T2  Elbow flexion   WNLS       Elbow extension   WNLS       Wrist flexion   WNLS       Wrist extension   WNLS       Wrist ulnar deviation           Wrist radial deviation           Wrist pronation           Wrist supination           (Blank rows = not tested)   UPPER EXTREMITY MMT: Not assessed MMT Right 03/05/2022 Left 03/05/2022  Shoulder flexion      Shoulder extension      Shoulder abduction      Shoulder adduction      Shoulder  internal rotation      Shoulder external rotation      Middle trapezius      Lower trapezius      Elbow flexion      Elbow extension      Wrist flexion      Wrist extension      Wrist ulnar deviation  Wrist radial deviation      Wrist pronation      Wrist supination      Grip strength (lbs)      (Blank rows = not tested)  04/02/22 R Knee Assessment : TTP post lat R knee in area of hamstring insertion R Knee strength 5/5, hip strength 4-/5 (L 4+/5) AROM of the R knee is WNLs             TODAY'S TREATMENT: [facility] Adult PT Treatment:                                                DATE: 06/02/22 Therapeutic Exercise: UBE Level 2 3 min each way Shoulder row  x20 RTB Shoulder ext x 20 RTB Shoulder IR x 20 RTB Shoulder ER x 20 Standing punch red 10 x 2 left  Standing left abduction AROM x 20  Ball on wall flexion circles  Supine shoulder chest press flexion 1# x 20  Supine L shoulder flexion circles x 10 each way  Supine tricep press 1# x 20 Supine bicep curl 1 # x 10, 2# x 10  S/L shoulder abduction 1 x 10, 1# SL L shoulder ER 1# 1x10   [facility] Adult PT Treatment:                                                DATE: 05/27/22 Therapeutic Exercise: UBE Level 1 2 min each way Shoulder row  x20 RTB Shoulder ext x 20 RTB Shoulder IR x 20 RTB Shoulder ER x 10 x 5 x 5 RTB Standing shoulder wall slides flexion and abduction x 10 each  Supine shoulder flexion to 90d , long arm x 10  Supine L shoulder perturbations at 90d 90 sec x 1  S/L shoulder abduction x 10  AROM SL L shoulder ER 1# 3x10 SUpine serratus press 1# x 15 Left  Passive left shoulder ROM flexion, abduction, ER, IR in scapular plane  [facility] Adult PT Treatment:                                                DATE: 05/22/22 Therapeutic Exercise: Shoulder flex to 90d 1# 2x10 Shoulder scaption 90 0# 2x10 SL L shoulder ER 1# 3x10 Supine L shoulder perturbations at 90d 3x1 min  Supine serratus press outs 2x10 2#L  3#R Shoulder pull overs c dowel x15 Shoulder row  2x10 RTB Shoulder ext 2x10 RTB  [facility] Adult PT Treatment:                                                DATE: 05/08/22 Therapeutic Exercise: UBE 4 mins L1 2 mins each direction Isometric shoulder ext x5 5" Isometric shoulder IR x5 5" Isometric shoulder flex x5 5" Isometric shoulder abd x5 5" Isometric shoulder  ER x5 5' Wall slide ulnar side of hand x10 Shoulder row 2x10 RTB Shoulder ext 2x10 RTB Shoulder IR 2x10 YTB Shoulder  ER 2x10 YTB  [facility] Adult PT Treatment:                                                DATE: 04/30/22 Therapeutic Exercise: UBE 4 mins L1 2 mins each direction Shoulder row 2x10 RTB Shoulder ext 2x10 RTB Shoulder flexion c UE ranger approx 60d of shoulder flexion 2x10 Shoulder IR 2x10 YTB Shoulder IR 2x10 YTB Bridging x10 SLR x10 Hip clams x10 GTB HEP updated   HOME EXERCISE PROGRAM: Access Code: [email or web address] URL: [email or web address] Date: 06/02/2022 Prepared by: Hessie Diener  Exercises - Seated Shoulder Flexion Towel Slide at Table Top  - 1 x daily - 7 x weekly - 1 sets - 10 reps - 5 hold - Seated Shoulder Abduction Towel Slide at Table Top  - 1 x daily - 7 x weekly - 1 sets - 10 reps - 5 hold - Seated Shoulder External Rotation PROM on Table  - 1 x daily - 7 x weekly - 1 sets - 10 reps - 5 hold - Standing Isometric Shoulder Internal Rotation at Doorway  - 1 x daily - 7 x weekly - 1 sets - 10 reps - 5 hold - Standing Isometric Shoulder Extension with Doorway - Arm Bent  - 1 x daily - 7 x weekly - 1 sets - 10 reps - 5 hold - Standing Isometric Shoulder Flexion with Doorway - Arm Bent  - 1 x daily - 7 x weekly - 1 sets - 10 reps - 5 hold - Standing Isometric Shoulder Abduction with Doorway - Arm Bent  - 1 x daily - 7 x weekly - 3 sets - 10 reps - 5 hold - Standing Isometric Shoulder External Rotation with Doorway  - 1 x daily - 7 x weekly - 1 sets - 10 reps - 1 hold - Standing Shoulder Row  with Anchored Resistance  - 1 x daily - 7 x weekly - 2 sets - 10 reps - 3 hold - Shoulder extension with resistance - Neutral  - 1 x daily - 7 x weekly - 2 sets - 10 reps - 3 hold - Seated Hamstring Stretch  - 1 x daily - 7 x weekly - 1 sets - 3 reps - 20 hold - Seated Piriformis Stretch (Mirrored)  - 1 x daily - 7 x weekly - 1 sets - 3 reps - 20 hold - Supine ITB Stretch with Strap  - 1 x daily - 7 x weekly - 3 sets - 3 reps - 20 hold - Sidelying Hip Abduction  - 1 x daily - 7 x weekly - 2-3 sets - 10 reps - 3 hold - Supine Bridge  - 1 x daily - 7 x weekly - 2-3 sets - 10 reps - 3 hold - Clamshell with Resistance  - 1 x daily - 7 x weekly - 2-3 sets - 10 reps - 3 hold - Shoulder External Rotation with Anchored Resistance  - 1 x daily - 7 x weekly - 2 sets - 10 reps - Shoulder Internal Rotation with Resistance  - 1 x daily - 7 x weekly - 2 sets - 10 reps - Single Arm Punch with Resistance  - 1 x daily - 7 x weekly - 2 sets - 10 reps     ASSESSMENT:   CLINICAL IMPRESSION: PT was completed for  strengthening of the L shoulder rotator cuff and periscapular muscles. She reports min soreness in shoulder post therapy last session. She demonstrates improved tolerance to stronger theraband. She continues to have some catching with eccentric motions Pt tolerated without adverse effects. . Pt will continue to benefit from skilled PT to address L shoulder strength deficits as per rotator cuff repair protocol.  OBJECTIVE IMPAIRMENTS decreased activity tolerance, decreased ROM, decreased strength, impaired UE functional use, and pain.     GOALS:   SHORT TERM GOALS:   Pt will be ind in an initail HEP Baseline: strated on eval Target date: 03/26/2022 Goal status: MET   2.  Increase L shoulder PROM to flex 140, ER c arm by side to 40, and abd s rotation to 80d. 04/08/22=150,155, and 58 respectively Baseline: 120, 10, 60 respectively Target date: 04/02/2022 Goal status: MET   LONG TERM GOALS:   Pt will  demonstrate PROM of the L shoulder 80% of the R Baseline: see flow sheets Target date: 07/10/22 Goal status: IMproving   2.  Pt will be able to complete AAROM exs indly Baseline:  Target date: 07/10/22 Goal status: Met   3.  Pt will be ind in an advanced HEP Baseline:  Target date: 07/10/22 Goal status: Progressing   4.  Develop progressive goals for the L shoulder as appropriate per shoulder rotator cuff protocol  Baseline:  Target date: 07/10/22 Goal status: Ongoing  5. Increase R hip strength to 4+/5 for improved function.  Baseline: 4-  Target date: 05/08/22  Goal Status: Deferred awaiting MRI R knee  6. Pt will report a decrease in R knee pain with daily activities to 2/10 or less. 05/08/22=0-2/10    Baseline: 5/10   Target date: 07/10/22   Goal Status: Improving PLAN: PT FREQUENCY: 2x per week   PT DURATION: 8 weeks   PLANNED INTERVENTIONS: Therapeutic exercises, Therapeutic activity, Neuromuscular re-education, Patient/Family education, Joint mobilization, Dry Needling, Electrical stimulation, Cryotherapy, Moist heat, Taping, Vasopneumatic device, Ultrasound, Ionotophoresis 62m/ml Dexamethasone, and Manual therapy   PLAN FOR NEXT SESSION: Initiate light weight L shoulder strengthening for HEP if tolerated in clinic. Review HEP for R LE strengthening. Will provide care as needed.for the R knee pain as indicated. R knee painhas resolved following an injection..Hessie Diener PTA 06/02/22 11:02 AM Phone: [phone]: [phone]

## 2022-06-04 ENCOUNTER — Ambulatory Visit: Payer: 59 | Admitting: Physical Therapy

## 2022-06-09 ENCOUNTER — Telehealth: Payer: Self-pay

## 2022-06-09 ENCOUNTER — Ambulatory Visit: Payer: 59

## 2022-06-09 NOTE — Telephone Encounter (Signed)
Spoke to pt about no show appt. Pt stated she was sick and not able to come to PT today. Advised pt re: attendance policy and of her upcoming appt. Encouraged pt to call and cancel an appt if she is not able to attend.

## 2022-06-09 NOTE — Therapy (Incomplete)
OUTPATIENT PHYSICAL THERAPY TREATMENT NOTE   Patient Name: Gabriela Le MRN: [id number] DOB:09-02-1976, 46 y.o., female Today's Date: 06/09/2022       PCP: Marrian Salvage, [place] REFERRING PROVIDER: Marrian Salvage,*      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   Past Surgical History:  Procedure Laterality Date   APPENDECTOMY     6 YOA   CESAREAN SECTION  01/06/2013   Procedure: CESAREAN SECTION;  Surgeon: Gabriela Manges, MD;  Location: [facility];  Service: Obstetrics;  Laterality: N/A;  Primary   CESAREAN SECTION N/A 10/09/2014   Procedure: CESAREAN SECTION;  Surgeon: Gabriela Dooms, MD;  Location: [facility];  Service: Obstetrics;  Laterality: N/A;   CESAREAN SECTION N/A 08/09/2017   Procedure: REPEAT CESAREAN SECTION;  Surgeon: Gabriela Drafts, MD;  Location: [facility];  Service: Obstetrics;  Laterality: N/A;   CHOLECYSTECTOMY N/A 11/27/2014   Procedure: LAPAROSCOPIC CHOLECYSTECTOMY WITH INTRAOPERATIVE CHOLANGIOGRAM;  Surgeon: Gabriela Messing III, MD;  Location: [facility];  Service: General;  Laterality: N/A;   CYSTECTOMY     SHOULDER ARTHROSCOPY WITH ROTATOR CUFF REPAIR Left 02/19/2022   Procedure: LEFT SHOULDER ARTHROSCOPY WITH ROTATOR CUFF REPAIR;  Surgeon: Gabriela Rossetti, MD;  Location: [facility];  Service: Orthopedics;  Laterality: Left;   SHOULDER ARTHROSCOPY WITH SUBACROMIAL DECOMPRESSION Left 02/27/2021   Procedure: LEFT SHOULDER ARTHROSCOPY WITH DEBRIDEMENT AND SUBACROMIAL DECOMPRESSION;  Surgeon: Gabriela Rossetti, MD;  Location: [facility];  Service: Orthopedics;  Laterality: Left;   Patient Active Problem List   Diagnosis Date Noted   Supraventricular  tachycardia ([place]) 03/03/2022   Complete tear of left rotator cuff 02/19/2022   Dyspnea on exertion 11/27/2021   Dyslipidemia 11/27/2021   Irregular menses 11/25/2021   Infertility associated with anovulation 11/25/2021   H/O cold sores 11/25/2021   Palpitations 11/17/2021   Impingement syndrome of left shoulder 02/27/2021   Epidermoid cyst 11/04/2020   Candidal vulvovaginitis 11/04/2020   Late prenatal care in third trimester 06/23/2017   Supervision of high risk pregnancy, antepartum 02/15/2017   Advanced maternal age in multigravida, unspecified trimester 02/15/2017   Previous cesarean delivery, antepartum condition or complication 02/54/2706   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  SUBJECTIVE:  SUBJECTIVE STATEMENT: Pt reports min soreness in shoulder.   PAIN:  Are you having pain? No NPRS scale: L shoulder- 0/10 L GH area; worst:8/10 brief. 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 Handles light objects   PATIENT GOALS: For my L shoulder to function as close to normal as possible   OBJECTIVE:    DIAGNOSTIC FINDINGS:  IMPRESSION: L shoulder 10/10/21 1. Supraspinatus tendinosis with extensive irregular tear of the anterior and mid tendon with  full-thickness components. This has significantly progressed compared to the previous study. Progressive supraspinatus muscle atrophy. 2. Intra-articular biceps tendinosis. 3. Moderate subacromial-subdeltoid bursitis.  MRI R knee 05/10/22: Lateral meniscus: The anterior horn is degenerated and torn and has a macerated appearance. The posterior horn is intact.   PATIENT SURVEYS:  FOTO TBA   COGNITION:          Overall cognitive status: Within functional limits for tasks assessed                               SENSATION:          Light touch: Appears intact             POSTURE: Forward head, rounded shoulder   UPPER EXTREMITY PROM:   A/PROM Right 03/05/2022 Left 03/05/2022 Left 03/18/22 Left 04/08/22 Left 04/22/22 Lt 05/08/22 LT 06/02/22  Shoulder flexion   120 140 tight end feel 150 155 A=145 c min shrug 140  Shoulder extension           Shoulder abduction   60 80 empty end feel 153 155  120  Shoulder adduction           Shoulder internal rotation         Reaches Lower thoracic  Shoulder external rotation   10 arm by side 40 c arm by side. Tight end feel c discomfort 58 65  Reaches T2  Elbow flexion   WNLS       Elbow extension   WNLS       Wrist flexion   WNLS       Wrist extension   WNLS       Wrist ulnar deviation           Wrist radial deviation           Wrist pronation           Wrist supination           (Blank rows = not tested)   UPPER EXTREMITY MMT: Not assessed MMT Right 03/05/2022 Left 03/05/2022  Shoulder flexion      Shoulder extension      Shoulder abduction      Shoulder adduction      Shoulder internal rotation      Shoulder external rotation      Middle trapezius      Lower trapezius      Elbow flexion      Elbow extension      Wrist flexion      Wrist extension      Wrist ulnar deviation  Wrist radial deviation      Wrist pronation      Wrist supination      Grip strength (lbs)      (Blank rows = not tested)  04/02/22 R Knee Assessment  : TTP post lat R knee in area of hamstring insertion R Knee strength 5/5, hip strength 4-/5 (L 4+/5) AROM of the R knee is WNLs             TODAY'S TREATMENT: [facility] Adult PT Treatment:                                                DATE: 06/09/22 Therapeutic Exercise: *** Manual Therapy: *** Neuromuscular re-ed: *** Therapeutic Activity: *** Modalities: *** Self Care: ***   [facility] Adult PT Treatment:                                                DATE: 06/02/22 Therapeutic Exercise: UBE Level 2 3 min each way Shoulder row  x20 RTB Shoulder ext x 20 RTB Shoulder IR x 20 RTB Shoulder ER x 20 Standing punch red 10 x 2 left  Standing left abduction AROM x 20  Ball on wall flexion circles  Supine shoulder chest press flexion 1# x 20  Supine L shoulder flexion circles x 10 each way  Supine tricep press 1# x 20 Supine bicep curl 1 # x 10, 2# x 10  S/L shoulder abduction 1 x 10, 1# SL L shoulder ER 1# 1x10   [facility] Adult PT Treatment:                                                DATE: 05/27/22 Therapeutic Exercise: UBE Level 1 2 min each way Shoulder row  x20 RTB Shoulder ext x 20 RTB Shoulder IR x 20 RTB Shoulder ER x 10 x 5 x 5 RTB Standing shoulder wall slides flexion and abduction x 10 each  Supine shoulder flexion to 90d , long arm x 10  Supine L shoulder perturbations at 90d 90 sec x 1  S/L shoulder abduction x 10  AROM SL L shoulder ER 1# 3x10 SUpine serratus press 1# x 15 Left  Passive left shoulder ROM flexion, abduction, ER, IR in scapular plane  [facility] Adult PT Treatment:                                                DATE: 05/22/22 Therapeutic Exercise: Shoulder flex to 90d 1# 2x10 Shoulder scaption 90 0# 2x10 SL L shoulder ER 1# 3x10 Supine L shoulder perturbations at 90d 3x1 min  Supine serratus press outs 2x10 2#L 3#R Shoulder pull overs c dowel x15 Shoulder row  2x10 RTB Shoulder ext 2x10 RTB  [facility] Adult PT Treatment:  DATE: 05/08/22 Therapeutic Exercise: UBE 4 mins L1 2 mins each direction Isometric shoulder ext x5 5" Isometric shoulder IR x5 5" Isometric shoulder flex x5 5" Isometric shoulder abd x5 5" Isometric shoulder  ER x5 5' Wall slide ulnar side of hand x10 Shoulder row 2x10 RTB Shoulder ext 2x10 RTB Shoulder IR 2x10 YTB Shoulder ER 2x10 YTB  [facility] Adult PT Treatment:                                                DATE: 04/30/22 Therapeutic Exercise: UBE 4 mins L1 2 mins each direction Shoulder row 2x10 RTB Shoulder ext 2x10 RTB Shoulder flexion c UE ranger approx 60d of shoulder flexion 2x10 Shoulder IR 2x10 YTB Shoulder IR 2x10 YTB Bridging x10 SLR x10 Hip clams x10 GTB HEP updated   HOME EXERCISE PROGRAM: Access Code: [email or web address] URL: [email or web address] Date: 06/02/2022 Prepared by: Hessie Diener  Exercises - Seated Shoulder Flexion Towel Slide at Table Top  - 1 x daily - 7 x weekly - 1 sets - 10 reps - 5 hold - Seated Shoulder Abduction Towel Slide at Table Top  - 1 x daily - 7 x weekly - 1 sets - 10 reps - 5 hold - Seated Shoulder External Rotation PROM on Table  - 1 x daily - 7 x weekly - 1 sets - 10 reps - 5 hold - Standing Isometric Shoulder Internal Rotation at Doorway  - 1 x daily - 7 x weekly - 1 sets - 10 reps - 5 hold - Standing Isometric Shoulder Extension with Doorway - Arm Bent  - 1 x daily - 7 x weekly - 1 sets - 10 reps - 5 hold - Standing Isometric Shoulder Flexion with Doorway - Arm Bent  - 1 x daily - 7 x weekly - 1 sets - 10 reps - 5 hold - Standing Isometric Shoulder Abduction with Doorway - Arm Bent  - 1 x daily - 7 x weekly - 3 sets - 10 reps - 5 hold - Standing Isometric Shoulder External Rotation with Doorway  - 1 x daily - 7 x weekly - 1 sets - 10 reps - 1 hold - Standing Shoulder Row with Anchored Resistance  - 1 x daily - 7 x weekly - 2 sets - 10 reps - 3 hold - Shoulder extension with resistance - Neutral  - 1 x daily - 7 x  weekly - 2 sets - 10 reps - 3 hold - Seated Hamstring Stretch  - 1 x daily - 7 x weekly - 1 sets - 3 reps - 20 hold - Seated Piriformis Stretch (Mirrored)  - 1 x daily - 7 x weekly - 1 sets - 3 reps - 20 hold - Supine ITB Stretch with Strap  - 1 x daily - 7 x weekly - 3 sets - 3 reps - 20 hold - Sidelying Hip Abduction  - 1 x daily - 7 x weekly - 2-3 sets - 10 reps - 3 hold - Supine Bridge  - 1 x daily - 7 x weekly - 2-3 sets - 10 reps - 3 hold - Clamshell with Resistance  - 1 x daily - 7 x weekly - 2-3 sets - 10 reps - 3 hold - Shoulder External Rotation with Anchored Resistance  - 1 x daily - 7 x weekly - 2 sets -  10 reps - Shoulder Internal Rotation with Resistance  - 1 x daily - 7 x weekly - 2 sets - 10 reps - Single Arm Punch with Resistance  - 1 x daily - 7 x weekly - 2 sets - 10 reps     ASSESSMENT:   CLINICAL IMPRESSION: PT was completed for strengthening of the L shoulder rotator cuff and periscapular muscles. She reports min soreness in shoulder post therapy last session. She demonstrates improved tolerance to stronger theraband. She continues to have some catching with eccentric motions Pt tolerated without adverse effects. . Pt will continue to benefit from skilled PT to address L shoulder strength deficits as per rotator cuff repair protocol.  OBJECTIVE IMPAIRMENTS decreased activity tolerance, decreased ROM, decreased strength, impaired UE functional use, and pain.     GOALS:   SHORT TERM GOALS:   Pt will be ind in an initail HEP Baseline: strated on eval Target date: 03/26/2022 Goal status: MET   2.  Increase L shoulder PROM to flex 140, ER c arm by side to 40, and abd s rotation to 80d. 04/08/22=150,155, and 58 respectively Baseline: 120, 10, 60 respectively Target date: 04/02/2022 Goal status: MET   LONG TERM GOALS:   Pt will demonstrate PROM of the L shoulder 80% of the R Baseline: see flow sheets Target date: 07/10/22 Goal status: IMproving   2.  Pt will be able  to complete AAROM exs indly Baseline:  Target date: 07/10/22 Goal status: Met   3.  Pt will be ind in an advanced HEP Baseline:  Target date: 07/10/22 Goal status: Progressing   4.  Develop progressive goals for the L shoulder as appropriate per shoulder rotator cuff protocol  Baseline:  Target date: 07/10/22 Goal status: Ongoing  5. Increase R hip strength to 4+/5 for improved function.  Baseline: 4-  Target date: 05/08/22  Goal Status: Deferred awaiting MRI R knee  6. Pt will report a decrease in R knee pain with daily activities to 2/10 or less. 05/08/22=0-2/10    Baseline: 5/10   Target date: 07/10/22   Goal Status: Improving PLAN: PT FREQUENCY: 2x per week   PT DURATION: 8 weeks   PLANNED INTERVENTIONS: Therapeutic exercises, Therapeutic activity, Neuromuscular re-education, Patient/Family education, Joint mobilization, Dry Needling, Electrical stimulation, Cryotherapy, Moist heat, Taping, Vasopneumatic device, Ultrasound, Ionotophoresis 65m/ml Dexamethasone, and Manual therapy   PLAN FOR NEXT SESSION: Initiate light weight L shoulder strengthening for HEP if tolerated in clinic. Review HEP for R LE strengthening. Will provide care as needed.for the R knee pain as indicated. R knee painhas resolved following an injection..Zenia ResidesRalls MS, PT 06/09/22 6:00 AM

## 2022-06-10 NOTE — Therapy (Signed)
OUTPATIENT PHYSICAL THERAPY TREATMENT NOTE   Patient Name: Gabriela Le MRN: [id number] DOB:03-30-1976, 46 y.o., female Today's Date: 06/11/2022       PCP: Marrian Salvage, [place] REFERRING PROVIDER: Marrian Salvage,*     PT End of Session - 06/11/22 1027     Visit Number 16    Number of Visits 28    Date for PT Re-Evaluation 07/10/22    Authorization Type [employer]    Progress Note Due on Visit 20    PT Start Time 1017    PT Stop Time 1100    PT Time Calculation (min) 43 min    Activity Tolerance Patient tolerated treatment well    Behavior During Therapy WFL for tasks assessed/performed             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   Past Surgical History:  Procedure Laterality Date   APPENDECTOMY     6 YOA   CESAREAN SECTION  01/06/2013   Procedure: CESAREAN SECTION;  Surgeon: Eldred Manges, MD;  Location: [facility];  Service: Obstetrics;  Laterality: N/A;  Primary   CESAREAN SECTION N/A 10/09/2014   Procedure: CESAREAN SECTION;  Surgeon: Alinda Dooms, MD;  Location: [facility];  Service: Obstetrics;  Laterality: N/A;   CESAREAN SECTION N/A 08/09/2017   Procedure: REPEAT CESAREAN SECTION;  Surgeon: Lavonia Drafts, MD;  Location: [facility];  Service: Obstetrics;  Laterality: N/A;   CHOLECYSTECTOMY N/A 11/27/2014   Procedure: LAPAROSCOPIC CHOLECYSTECTOMY WITH INTRAOPERATIVE CHOLANGIOGRAM;  Surgeon: Autumn Messing III, MD;  Location: [facility];  Service: General;  Laterality: N/A;   CYSTECTOMY     SHOULDER ARTHROSCOPY WITH ROTATOR CUFF REPAIR Left 02/19/2022   Procedure: LEFT SHOULDER ARTHROSCOPY WITH ROTATOR CUFF REPAIR;  Surgeon: Mcarthur Rossetti, MD;  Location:  [facility];  Service: Orthopedics;  Laterality: Left;   SHOULDER ARTHROSCOPY WITH SUBACROMIAL DECOMPRESSION Left 02/27/2021   Procedure: LEFT SHOULDER ARTHROSCOPY WITH DEBRIDEMENT AND SUBACROMIAL DECOMPRESSION;  Surgeon: Mcarthur Rossetti, MD;  Location: [facility];  Service: Orthopedics;  Laterality: Left;   Patient Active Problem List   Diagnosis Date Noted   Supraventricular tachycardia ([place]) 03/03/2022   Complete tear of left rotator cuff 02/19/2022   Dyspnea on exertion 11/27/2021   Dyslipidemia 11/27/2021   Irregular menses 11/25/2021   Infertility associated with anovulation 11/25/2021   H/O cold sores 11/25/2021   Palpitations 11/17/2021   Impingement syndrome of left shoulder 02/27/2021   Epidermoid cyst 11/04/2020   Candidal vulvovaginitis 11/04/2020   Late prenatal care in third trimester 06/23/2017   Supervision of high risk pregnancy, antepartum 02/15/2017   Advanced maternal age in multigravida, unspecified trimester 02/15/2017   Previous cesarean delivery, antepartum condition or complication 01/56/1537   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 The L [place] joint area is not painful. Pt notes her   PAIN:  Are you having pain? No NPRS scale: L shoulder- 0/10 L GH area; worst:8/10 brief. R knee 0/10 Pain location: L GH and upper trap Pain orientation:  Left  PAIN TYPE: dull, sharp, and stabbing, heavy Pain description: constant  Aggravating factors: Out of sling for shower Relieving factors: cold packs  PERTINENT HISTORY: Previous arthroscopic surgery 02/27/21   PRECAUTIONS:  Sling DCed on 03/25/22/ following appt c Dr. 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 Supraspinatus tendinosis with extensive irregular tear of the anterior and mid tendon with full-thickness components. This has significantly progressed compared to the previous study. Progressive supraspinatus muscle atrophy. 2. Intra-articular biceps tendinosis. 3. Moderate subacromial-subdeltoid bursitis.  MRI R knee 05/10/22: Lateral meniscus: The anterior horn is degenerated and torn and has a macerated appearance. The posterior horn is intact.   PATIENT SURVEYS:  FOTO TBA   COGNITION:          Overall cognitive status: Within functional limits for tasks assessed                               SENSATION:          Light touch: Appears intact             POSTURE: Forward head, rounded shoulder   UPPER EXTREMITY PROM:   A/PROM Right 03/05/2022 Left 03/05/2022 Left 03/18/22 Left 04/08/22 Left 04/22/22 Lt 05/08/22 LT 06/02/22  Shoulder flexion   120 140 tight end feel 150 155 A=145 c min shrug 140  Shoulder extension           Shoulder abduction   60 80 empty end feel 153 155  120  Shoulder adduction           Shoulder internal rotation         Reaches Lower thoracic  Shoulder external rotation   10 arm by side 40 c arm by side. Tight end feel c discomfort 58 65  Reaches T2  Elbow flexion   WNLS       Elbow extension   WNLS       Wrist flexion   WNLS       Wrist extension   WNLS       Wrist ulnar deviation           Wrist radial deviation           Wrist  pronation           Wrist supination           (Blank rows = not tested)   UPPER EXTREMITY MMT: Not assessed MMT Right 03/05/2022 Left 03/05/2022  Shoulder flexion      Shoulder extension      Shoulder abduction      Shoulder adduction      Shoulder internal rotation      Shoulder external rotation      Middle trapezius      Lower trapezius      Elbow flexion      Elbow extension      Wrist flexion  Wrist extension      Wrist ulnar deviation      Wrist radial deviation      Wrist pronation      Wrist supination      Grip strength (lbs)      (Blank rows = not tested)  04/02/22 R Knee Assessment : TTP post lat R knee in area of hamstring insertion R Knee strength 5/5, hip strength 4-/5 (L 4+/5) AROM of the R knee is WNLs             TODAY'S TREATMENT: [facility] Adult PT Treatment:                                                DATE: 06/11/22 Therapeutic Exercise: UBE Level 1 2 min each way Shoulder row  3x10 GTB Shoulder ext 3x10 GTB Shoulder IR 3x10 RTB Shoulder ER 3x10  Shoulder flexion to 90d c 1lb 3x10 min shrug Supine shoulder flexion/ext (eccentric control) 2x10 Supine small range clock c 2# 2x 19mn  [facility] Adult PT Treatment:                                                DATE: 06/02/22 Therapeutic Exercise: UBE Level 2 3 min each way Shoulder row  x20 RTB Shoulder ext x 20 RTB Shoulder IR x 20 RTB Shoulder ER x 20 Standing punch red 10 x 2 left  Standing left abduction AROM x 20  Ball on wall flexion circles  Supine shoulder chest press flexion 1# x 20  Supine L shoulder flexion circles x 10 each way  Supine tricep press 1# x 20 Supine bicep curl 1 # x 10, 2# x 10  S/L shoulder abduction 1 x 10, 1# SL L shoulder ER 1# 1x10   [facility] Adult PT Treatment:                                                DATE: 05/27/22 Therapeutic Exercise: UBE Level 1 2 min each way Shoulder row  x20 RTB Shoulder ext x 20 RTB Shoulder IR x 20 RTB Shoulder ER x 10 x 5 x 5  RTB Standing shoulder wall slides flexion and abduction x 10 each  Supine shoulder flexion to 90d , long arm x 10  Supine L shoulder perturbations at 90d 90 sec x 1  S/L shoulder abduction x 10  AROM SL L shoulder ER 1# 3x10 SUpine serratus press 1# x 15 Left  Passive left shoulder ROM flexion, abduction, ER, IR in scapular plane   HOME EXERCISE PROGRAM: Access Code: [email or web address] URL: [email or web address] Date: 06/02/2022 Prepared by: JHessie Diener Exercises - Seated Shoulder Flexion Towel Slide at Table Top  - 1 x daily - 7 x weekly - 1 sets - 10 reps - 5 hold - Seated Shoulder Abduction Towel Slide at Table Top  - 1 x daily - 7 x weekly - 1 sets - 10 reps - 5 hold - Seated Shoulder External Rotation PROM on Table  - 1 x daily - 7 x weekly - 1 sets - 10 reps -  5 hold - Standing Isometric Shoulder Internal Rotation at Doorway  - 1 x daily - 7 x weekly - 1 sets - 10 reps - 5 hold - Standing Isometric Shoulder Extension with Doorway - Arm Bent  - 1 x daily - 7 x weekly - 1 sets - 10 reps - 5 hold - Standing Isometric Shoulder Flexion with Doorway - Arm Bent  - 1 x daily - 7 x weekly - 1 sets - 10 reps - 5 hold - Standing Isometric Shoulder Abduction with Doorway - Arm Bent  - 1 x daily - 7 x weekly - 3 sets - 10 reps - 5 hold - Standing Isometric Shoulder External Rotation with Doorway  - 1 x daily - 7 x weekly - 1 sets - 10 reps - 1 hold - Standing Shoulder Row with Anchored Resistance  - 1 x daily - 7 x weekly - 2 sets - 10 reps - 3 hold - Shoulder extension with resistance - Neutral  - 1 x daily - 7 x weekly - 2 sets - 10 reps - 3 hold - Seated Hamstring Stretch  - 1 x daily - 7 x weekly - 1 sets - 3 reps - 20 hold - Seated Piriformis Stretch (Mirrored)  - 1 x daily - 7 x weekly - 1 sets - 3 reps - 20 hold - Supine ITB Stretch with Strap  - 1 x daily - 7 x weekly - 3 sets - 3 reps - 20 hold - Sidelying Hip Abduction  - 1 x daily - 7 x weekly - 2-3 sets - 10 reps - 3 hold -  Supine Bridge  - 1 x daily - 7 x weekly - 2-3 sets - 10 reps - 3 hold - Clamshell with Resistance  - 1 x daily - 7 x weekly - 2-3 sets - 10 reps - 3 hold - Shoulder External Rotation with Anchored Resistance  - 1 x daily - 7 x weekly - 2 sets - 10 reps - Shoulder Internal Rotation with Resistance  - 1 x daily - 7 x weekly - 2 sets - 10 reps - Single Arm Punch with Resistance  - 1 x daily - 7 x weekly - 2 sets - 10 reps     ASSESSMENT:   CLINICAL IMPRESSION: PT was completed for L shoulder strengthening  including the rotator cuff and periscapular muscles. Eccentric lowering form flexion continues to be difficult with the pt reporting her shoulder feels stuck and weak. The demand for R shoulder strengthening continues to be gradually increased. Pt will continue to benefit from skilled PT to address L shoulder strength for optimized function.  OBJECTIVE IMPAIRMENTS decreased activity tolerance, decreased ROM, decreased strength, impaired UE functional use, and pain.     GOALS:   SHORT TERM GOALS:   Pt will be ind in an initail HEP Baseline: strated on eval Target date: 03/26/2022 Goal status: MET   2.  Increase L shoulder PROM to flex 140, ER c arm by side to 40, and abd s rotation to 80d. 04/08/22=150,155, and 58 respectively Baseline: 120, 10, 60 respectively Target date: 04/02/2022 Goal status: MET   LONG TERM GOALS:   Pt will demonstrate PROM of the L shoulder 80% of the R Baseline: see flow sheets Target date: 07/10/22 Goal status: IMproving   2.  Pt will be able to complete AAROM exs indly Baseline:  Target date: 07/10/22 Goal status: Met   3.  Pt will be ind in  an advanced HEP Baseline:  Target date: 07/10/22 Goal status: Progressing   4.  Develop progressive goals for the L shoulder as appropriate per shoulder rotator cuff protocol  Baseline:  Target date: 07/10/22 Goal status: Ongoing  5. Increase R hip strength to 4+/5 for improved function.  Baseline: 4-  Target  date: 05/08/22  Goal Status: Deferred awaiting MRI R knee  6. Pt will report a decrease in R knee pain with daily activities to 2/10 or less. 05/08/22=0-2/10    Baseline: 5/10   Target date: 07/10/22   Goal Status: Improving PLAN: PT FREQUENCY: 2x per week   PT DURATION: 8 weeks   PLANNED INTERVENTIONS: Therapeutic exercises, Therapeutic activity, Neuromuscular re-education, Patient/Family education, Joint mobilization, Dry Needling, Electrical stimulation, Cryotherapy, Moist heat, Taping, Vasopneumatic device, Ultrasound, Ionotophoresis 7m/ml Dexamethasone, and Manual therapy   PLAN FOR NEXT SESSION: Initiate light weight L shoulder strengthening for HEP if tolerated in clinic. Review HEP for R LE strengthening.    Ineta Sinning MS, PT 06/11/22 11:18 AM

## 2022-06-11 ENCOUNTER — Ambulatory Visit: Payer: 59

## 2022-06-11 DIAGNOSIS — G8929 Other chronic pain: Secondary | ICD-10-CM

## 2022-06-11 DIAGNOSIS — M25512 Pain in left shoulder: Secondary | ICD-10-CM

## 2022-06-11 DIAGNOSIS — M6281 Muscle weakness (generalized): Secondary | ICD-10-CM

## 2022-06-11 DIAGNOSIS — M25612 Stiffness of left shoulder, not elsewhere classified: Secondary | ICD-10-CM

## 2022-06-17 NOTE — Therapy (Incomplete)
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   Past Surgical History:  Procedure Laterality Date   APPENDECTOMY     6 YOA   CESAREAN SECTION  01/06/2013   Procedure: CESAREAN SECTION;  Surgeon: Eldred Manges, MD;  Location: [facility];  Service: Obstetrics;  Laterality: N/A;  Primary   CESAREAN SECTION N/A 10/09/2014   Procedure: CESAREAN SECTION;  Surgeon: Alinda Dooms, MD;  Location: [facility];  Service: Obstetrics;  Laterality: N/A;   CESAREAN SECTION N/A 08/09/2017   Procedure: REPEAT CESAREAN SECTION;  Surgeon: Lavonia Drafts, MD;  Location: [facility];  Service: Obstetrics;  Laterality: N/A;   CHOLECYSTECTOMY N/A 11/27/2014   Procedure: LAPAROSCOPIC CHOLECYSTECTOMY WITH INTRAOPERATIVE CHOLANGIOGRAM;  Surgeon: Autumn Messing III, MD;  Location: [facility];  Service: General;  Laterality: N/A;   CYSTECTOMY     SHOULDER ARTHROSCOPY WITH ROTATOR CUFF REPAIR Left 02/19/2022   Procedure: LEFT SHOULDER ARTHROSCOPY WITH ROTATOR CUFF REPAIR;  Surgeon: Mcarthur Rossetti, MD;  Location: [facility];  Service: Orthopedics;  Laterality: Left;   SHOULDER ARTHROSCOPY WITH SUBACROMIAL DECOMPRESSION Left 02/27/2021   Procedure: LEFT SHOULDER ARTHROSCOPY WITH DEBRIDEMENT AND SUBACROMIAL DECOMPRESSION;  Surgeon: Mcarthur Rossetti, MD;  Location: [facility];  Service: Orthopedics;  Laterality: Left;   Patient Active Problem List   Diagnosis Date Noted    Supraventricular tachycardia ([place]) 03/03/2022   Complete tear of left rotator cuff 02/19/2022   Dyspnea on exertion 11/27/2021   Dyslipidemia 11/27/2021   Irregular menses 11/25/2021   Infertility associated with anovulation 11/25/2021   H/O cold sores 11/25/2021   Palpitations 11/17/2021   Impingement syndrome of left shoulder 02/27/2021   Epidermoid cyst 11/04/2020   Candidal vulvovaginitis 11/04/2020   Late prenatal care in third trimester 06/23/2017   Supervision of high risk pregnancy, antepartum 02/15/2017   Advanced maternal age in multigravida, unspecified trimester 02/15/2017   Previous cesarean delivery, antepartum condition or complication 85/01/7740   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  SUBJECTIVE:  SUBJECTIVE STATEMENT:  Pt reports L upper shoulder soreness. The L [place] joint area is not painful. Pt notes her   PAIN:  Are you having pain? No NPRS scale: L shoulder- 0/10 L GH area; worst:8/10 brief. R knee 0/10 Pain location: L GH and upper trap Pain orientation: Left  PAIN TYPE: dull, sharp, and stabbing, heavy Pain description: constant  Aggravating factors: Out of sling for shower Relieving factors: cold packs  PERTINENT HISTORY: Previous arthroscopic surgery 02/27/21   PRECAUTIONS:  Sling DCed on 03/25/22/ following appt c Dr. 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 Supraspinatus tendinosis  with extensive irregular tear of the anterior and mid tendon with full-thickness components. This has significantly progressed compared to the previous study. Progressive supraspinatus muscle atrophy. 2. Intra-articular biceps tendinosis. 3. Moderate subacromial-subdeltoid bursitis.  MRI R knee 05/10/22: Lateral meniscus: The anterior horn is degenerated and torn and has a macerated appearance. The posterior horn is intact.   PATIENT SURVEYS:  FOTO TBA   COGNITION:          Overall cognitive status: Within functional limits for tasks assessed                               SENSATION:          Light touch: Appears intact             POSTURE: Forward head, rounded shoulder   UPPER EXTREMITY PROM:   A/PROM Right 03/05/2022 Left 03/05/2022 Left 03/18/22 Left 04/08/22 Left 04/22/22 Lt 05/08/22 LT 06/02/22  Shoulder flexion   120 140 tight end feel 150 155 A=145 c min shrug 140  Shoulder extension           Shoulder abduction   60 80 empty end feel 153 155  120  Shoulder adduction           Shoulder internal rotation         Reaches Lower thoracic  Shoulder external rotation   10 arm by side 40 c arm by side. Tight end feel c discomfort 58 65  Reaches T2  Elbow flexion   WNLS       Elbow extension   WNLS       Wrist flexion   WNLS       Wrist extension   WNLS       Wrist ulnar deviation           Wrist radial deviation           Wrist pronation           Wrist supination           (Blank rows = not tested)   UPPER EXTREMITY MMT: Not assessed MMT Right 03/05/2022 Left 03/05/2022  Shoulder flexion      Shoulder extension      Shoulder abduction      Shoulder adduction      Shoulder internal rotation      Shoulder external rotation      Middle trapezius      Lower trapezius      Elbow flexion      Elbow extension      Wrist flexion      Wrist  extension      Wrist ulnar deviation      Wrist radial deviation      Wrist pronation      Wrist supination      Grip strength  (lbs)      (Blank rows = not tested)  04/02/22 R Knee Assessment : TTP post lat R knee in area of hamstring insertion R Knee strength 5/5, hip strength 4-/5 (L 4+/5) AROM of the R knee is WNLs             TODAY'S TREATMENT [facility] Adult PT Treatment:                                                DATE: 06/17/22 Therapeutic Exercise: *** Manual Therapy: *** Neuromuscular re-ed: *** Therapeutic Activity: *** Modalities: *** Self Care: ***  [facility] Adult PT Treatment:                                                DATE: 06/11/22 Therapeutic Exercise: UBE Level 1 2 min each way Shoulder row  3x10 GTB Shoulder ext 3x10 GTB Shoulder IR 3x10 RTB Shoulder ER 3x10  Shoulder flexion to 90d c 1lb 3x10 min shrug Supine shoulder flexion/ext (eccentric control) 2x10 Supine small range clock c 2# 2x 80mn  [facility] Adult PT Treatment:                                                DATE: 06/02/22 Therapeutic Exercise: UBE Level 2 3 min each way Shoulder row  x20 RTB Shoulder ext x 20 RTB Shoulder IR x 20 RTB Shoulder ER x 20 Standing punch red 10 x 2 left  Standing left abduction AROM x 20  Ball on wall flexion circles  Supine shoulder chest press flexion 1# x 20  Supine L shoulder flexion circles x 10 each way  Supine tricep press 1# x 20 Supine bicep curl 1 # x 10, 2# x 10  S/L shoulder abduction 1 x 10, 1# SL L shoulder ER 1# 1x10   [facility] Adult PT Treatment:                                                DATE: 05/27/22 Therapeutic Exercise: UBE Level 1 2 min each way Shoulder row  x20 RTB Shoulder ext x 20 RTB Shoulder IR x 20 RTB Shoulder ER x 10 x 5 x 5 RTB Standing shoulder wall slides flexion and abduction x 10 each  Supine shoulder flexion to 90d , long arm x 10  Supine L shoulder perturbations at 90d 90 sec x 1  S/L shoulder abduction x 10  AROM SL L shoulder ER 1# 3x10 SUpine serratus press 1# x 15 Left  Passive left shoulder ROM flexion, abduction, ER, IR in scapular  plane   HOME EXERCISE PROGRAM: Access Code: [email or web address] URL: [email or web address] Date: 06/02/2022 Prepared by: JHessie Diener Exercises - Seated Shoulder Flexion Towel Slide at  Table Top  - 1 x daily - 7 x weekly - 1 sets - 10 reps - 5 hold - Seated Shoulder Abduction Towel Slide at Table Top  - 1 x daily - 7 x weekly - 1 sets - 10 reps - 5 hold - Seated Shoulder External Rotation PROM on Table  - 1 x daily - 7 x weekly - 1 sets - 10 reps - 5 hold - Standing Isometric Shoulder Internal Rotation at Doorway  - 1 x daily - 7 x weekly - 1 sets - 10 reps - 5 hold - Standing Isometric Shoulder Extension with Doorway - Arm Bent  - 1 x daily - 7 x weekly - 1 sets - 10 reps - 5 hold - Standing Isometric Shoulder Flexion with Doorway - Arm Bent  - 1 x daily - 7 x weekly - 1 sets - 10 reps - 5 hold - Standing Isometric Shoulder Abduction with Doorway - Arm Bent  - 1 x daily - 7 x weekly - 3 sets - 10 reps - 5 hold - Standing Isometric Shoulder External Rotation with Doorway  - 1 x daily - 7 x weekly - 1 sets - 10 reps - 1 hold - Standing Shoulder Row with Anchored Resistance  - 1 x daily - 7 x weekly - 2 sets - 10 reps - 3 hold - Shoulder extension with resistance - Neutral  - 1 x daily - 7 x weekly - 2 sets - 10 reps - 3 hold - Seated Hamstring Stretch  - 1 x daily - 7 x weekly - 1 sets - 3 reps - 20 hold - Seated Piriformis Stretch (Mirrored)  - 1 x daily - 7 x weekly - 1 sets - 3 reps - 20 hold - Supine ITB Stretch with Strap  - 1 x daily - 7 x weekly - 3 sets - 3 reps - 20 hold - Sidelying Hip Abduction  - 1 x daily - 7 x weekly - 2-3 sets - 10 reps - 3 hold - Supine Bridge  - 1 x daily - 7 x weekly - 2-3 sets - 10 reps - 3 hold - Clamshell with Resistance  - 1 x daily - 7 x weekly - 2-3 sets - 10 reps - 3 hold - Shoulder External Rotation with Anchored Resistance  - 1 x daily - 7 x weekly - 2 sets - 10 reps - Shoulder Internal Rotation with Resistance  - 1 x daily - 7 x weekly - 2  sets - 10 reps - Single Arm Punch with Resistance  - 1 x daily - 7 x weekly - 2 sets - 10 reps     ASSESSMENT:   CLINICAL IMPRESSION: PT was completed for L shoulder strengthening  including the rotator cuff and periscapular muscles. Eccentric lowering form flexion continues to be difficult with the pt reporting her shoulder feels stuck and weak. The demand for R shoulder strengthening continues to be gradually increased. Pt will continue to benefit from skilled PT to address L shoulder strength for optimized function.  OBJECTIVE IMPAIRMENTS decreased activity tolerance, decreased ROM, decreased strength, impaired UE functional use, and pain.     GOALS:   SHORT TERM GOALS:   Pt will be ind in an initail HEP Baseline: strated on eval Target date: 03/26/2022 Goal status: MET   2.  Increase L shoulder PROM to flex 140, ER c arm by side to 40, and abd s rotation to 80d. 04/08/22=150,155, and 58  respectively Baseline: 120, 10, 60 respectively Target date: 04/02/2022 Goal status: MET   LONG TERM GOALS:   Pt will demonstrate PROM of the L shoulder 80% of the R Baseline: see flow sheets Target date: 07/10/22 Goal status: IMproving   2.  Pt will be able to complete AAROM exs indly Baseline:  Target date: 07/10/22 Goal status: Met   3.  Pt will be ind in an advanced HEP Baseline:  Target date: 07/10/22 Goal status: Progressing   4.  Develop progressive goals for the L shoulder as appropriate per shoulder rotator cuff protocol  Baseline:  Target date: 07/10/22 Goal status: Ongoing  5. Increase R hip strength to 4+/5 for improved function.  Baseline: 4-  Target date: 05/08/22  Goal Status: Deferred awaiting MRI R knee  6. Pt will report a decrease in R knee pain with daily activities to 2/10 or less. 05/08/22=0-2/10    Baseline: 5/10   Target date: 07/10/22   Goal Status: Improving PLAN: PT FREQUENCY: 2x per week   PT DURATION: 8 weeks   PLANNED INTERVENTIONS: Therapeutic  exercises, Therapeutic activity, Neuromuscular re-education, Patient/Family education, Joint mobilization, Dry Needling, Electrical stimulation, Cryotherapy, Moist heat, Taping, Vasopneumatic device, Ultrasound, Ionotophoresis 48m/ml Dexamethasone, and Manual therapy   PLAN FOR NEXT SESSION: Initiate light weight L shoulder strengthening for HEP if tolerated in clinic. Review HEP for R LE strengthening.    Alazia Crocket MS, PT 06/17/22 9:28 PM

## 2022-06-18 ENCOUNTER — Ambulatory Visit: Payer: 59

## 2022-06-18 DIAGNOSIS — M6281 Muscle weakness (generalized): Secondary | ICD-10-CM | POA: Diagnosis not present

## 2022-06-18 DIAGNOSIS — M25612 Stiffness of left shoulder, not elsewhere classified: Secondary | ICD-10-CM

## 2022-06-18 DIAGNOSIS — G8929 Other chronic pain: Secondary | ICD-10-CM

## 2022-06-18 DIAGNOSIS — M25512 Pain in left shoulder: Secondary | ICD-10-CM

## 2022-06-23 ENCOUNTER — Ambulatory Visit: Payer: 59 | Admitting: Physical Therapy

## 2022-06-24 NOTE — Therapy (Signed)
OUTPATIENT PHYSICAL THERAPY TREATMENT NOTE   Patient Name: Gabriela Le MRN: [id number] DOB:[birth year], 46 y.o., female Today's Date: 06/25/2022       PCP: Marrian Salvage, [place] REFERRING PROVIDER: Marrian Salvage,*     PT End of Session - 06/25/22 1047     Visit Number 18    Number of Visits 28    Date for PT Re-Evaluation 07/10/22    Authorization Type [employer]    Progress Note Due on Visit 20    PT Start Time 1020    PT Stop Time 1100    PT Time Calculation (min) 40 min    Activity Tolerance Patient tolerated treatment well    Behavior During Therapy [facility] for tasks assessed/performed               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   Past Surgical History:  Procedure Laterality Date   APPENDECTOMY     6 YOA   CESAREAN SECTION  01/06/2013   Procedure: CESAREAN SECTION;  Surgeon: Eldred Manges, MD;  Location: [facility];  Service: Obstetrics;  Laterality: N/A;  Primary   CESAREAN SECTION N/A 10/09/2014   Procedure: CESAREAN SECTION;  Surgeon: Alinda Dooms, MD;  Location: [facility];  Service: Obstetrics;  Laterality: N/A;   CESAREAN SECTION N/A 08/09/2017   Procedure: REPEAT CESAREAN SECTION;  Surgeon: Lavonia Drafts, MD;  Location: [place];  Service: Obstetrics;  Laterality: N/A;   CHOLECYSTECTOMY N/A 11/27/2014   Procedure: LAPAROSCOPIC CHOLECYSTECTOMY WITH INTRAOPERATIVE CHOLANGIOGRAM;  Surgeon: Autumn Messing III, MD;  Location: [facility];  Service: General;  Laterality: N/A;   CYSTECTOMY     SHOULDER ARTHROSCOPY WITH ROTATOR CUFF REPAIR Left 02/19/2022   Procedure: LEFT SHOULDER ARTHROSCOPY WITH ROTATOR CUFF REPAIR;  Surgeon: Mcarthur Rossetti, MD;   Location: [place];  Service: Orthopedics;  Laterality: Left;   SHOULDER ARTHROSCOPY WITH SUBACROMIAL DECOMPRESSION Left 02/27/2021   Procedure: LEFT SHOULDER ARTHROSCOPY WITH DEBRIDEMENT AND SUBACROMIAL DECOMPRESSION;  Surgeon: Mcarthur Rossetti, MD;  Location: [place];  Service: Orthopedics;  Laterality: Left;   Patient Active Problem List   Diagnosis Date Noted   Supraventricular tachycardia ([place]) 03/03/2022   Complete tear of left rotator cuff 02/19/2022   Dyspnea on exertion 11/27/2021   Dyslipidemia 11/27/2021   Irregular menses 11/25/2021   Infertility associated with anovulation 11/25/2021   H/O cold sores 11/25/2021   Palpitations 11/17/2021   Impingement syndrome of left shoulder 02/27/2021   Epidermoid cyst 11/04/2020   Candidal vulvovaginitis 11/04/2020   Late prenatal care in third trimester 06/23/2017   Supervision of high risk pregnancy, antepartum 02/15/2017   Advanced maternal age in multigravida, unspecified trimester 02/15/2017   Previous cesarean delivery, antepartum condition or complication 82/05/155   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 Her upper trap pain is better, but she has N/T there sometimes.  PAIN:  Are you having pain? No NPRS scale: L shoulder- 0/10 L GH area; worst:2/10 brief. R knee 0/10 Pain location: L GH and upper trap Pain  orientation: Left  PAIN TYPE: dull, sharp, and stabbing, heavy Pain description: constant  Aggravating factors: Out of sling for shower Relieving factors: cold packs L upper trap: 0/10  PERTINENT HISTORY: Previous arthroscopic surgery 02/27/21   PRECAUTIONS:  Sling DCed on 03/25/22/ following appt c Dr. 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 Handles light objects   PATIENT GOALS: For my L shoulder to function as close to normal as possible   OBJECTIVE:    DIAGNOSTIC FINDINGS:  IMPRESSION: L shoulder 10/10/21 1. Supraspinatus tendinosis with extensive irregular tear of the anterior and mid tendon with full-thickness components. This has significantly progressed compared to the previous study. Progressive supraspinatus muscle atrophy. 2. Intra-articular biceps tendinosis. 3. Moderate subacromial-subdeltoid bursitis.  MRI R knee 05/10/22: Lateral meniscus: The anterior horn is degenerated and torn and has a macerated appearance. The posterior horn is intact.   PATIENT SURVEYS:  FOTO TBA   COGNITION:          Overall cognitive status: Within functional limits for tasks assessed                               SENSATION:          Light touch: Appears intact             POSTURE: Forward head, rounded shoulder   UPPER EXTREMITY PROM:   A/PROM Right 03/05/2022 Left 03/05/2022 Left 03/18/22 Left 04/08/22 Left 04/22/22 Lt 05/08/22 LT 06/02/22  Shoulder flexion   120 140 tight end feel 150 155 A=145 c min shrug 140  Shoulder extension           Shoulder abduction   60 80 empty end feel 153 155  120  Shoulder adduction           Shoulder internal rotation         Reaches Lower thoracic  Shoulder external rotation   10 arm by side 40 c arm by side. Tight end feel c discomfort 58 65  Reaches T2  Elbow flexion   WNLS       Elbow extension   WNLS       Wrist flexion   WNLS       Wrist extension   WNLS       Wrist ulnar deviation           Wrist  radial deviation           Wrist pronation           Wrist supination           (Blank rows = not tested)   UPPER EXTREMITY MMT: Not assessed MMT Right 03/05/2022 Left 03/05/2022  Shoulder flexion      Shoulder extension      Shoulder abduction      Shoulder adduction      Shoulder internal rotation      Shoulder external rotation      Middle trapezius      Lower trapezius      Elbow flexion      Elbow extension  Wrist flexion      Wrist extension      Wrist ulnar deviation      Wrist radial deviation      Wrist pronation      Wrist supination      Grip strength (lbs)      (Blank rows = not tested)  04/02/22 R Knee Assessment : TTP post lat R knee in area of hamstring insertion R Knee strength 5/5, hip strength 4-/5 (L 4+/5) AROM of the R knee is WNLs             TODAY'S TREATMENT [facility] Adult PT Treatment:                                                DATE: 06/25/22 Therapeutic Exercise: UBE Level 1 2 min each way Circle c ball on wall 2x20 each direction Wall slide on lateral hand 2x10 for eccentric control Side lying L shoulder ER 3x10 3# Side lying L shoulder scaption 3x10 2# Serratus wall slides c pillow case 2x10 Serratus wall presses 2x20 Shoulder ER c RTB 3x10 Shoulder row  3x10 GTB Shoulder ext 3x10 GTB Lower Trap 3x 10 GTB   [facility] Adult PT Treatment:                                                DATE: 06/17/22 Therapeutic Exercise: UBE Level 1 2 min each way Shoulder flexion to 90d c 1lb 3x10 min shrug Circle c ball on wall 2x10 each direction Wall slide on lateral hand 2x10 for eccentric control Side lying L shoulder ER 3x10 2# Shoulder row  3x10 GTB Shoulder ext 3x10 GTB Lower Trap 3x 10 GTB  Manual Therapy: STM to the upper trap Skilled palapation of the of TRP and taut bands of the L    Trigger Point Dry Needling Treatment: Pre-treatment instruction: Patient instructed on dry needling rationale, procedures, and possible side effects including  pain during treatment (achy,cramping feeling), bruising, drop of blood, lightheadedness, nausea, sweating. Patient Consent Given: Yes Education handout provided: Yes Muscles treated: L upper trap  Needle size and number: .30x4m 1 needle Electrical stimulation performed: No Parameters: N/A Treatment response/outcome: Twitch response elicited and Palpable decrease in muscle tension Post-treatment instructions: Patient instructed to expect possible mild to moderate muscle soreness later today and/or tomorrow. Patient instructed in methods to reduce muscle soreness and to continue prescribed HEP. If patient was dry needled over the lung field, patient was instructed on signs and symptoms of pneumothorax and, however unlikely, to see immediate medical attention should they occur. Patient was also educated on signs and symptoms of infection and to seek medical attention should they occur. Patient verbalized understanding of these instructions and education.   [facility]                                                [facility]: [facility] Therapeutic Exercise: UBE Level 1 2 min each way Shoulder row  3x10 GTB Shoulder ext 3x10 GTB Shoulder IR 3x10 RTB Shoulder ER 3x10  Shoulder flexion to 90d c  1lb 3x10 min shrug Supine shoulder flexion/ext (eccentric control) 2x10 Supine small range clock c 2# 2x 50mn    HOME EXERCISE PROGRAM: Access Code: [email or web address]: [email or web address] Date: 06/02/2022 Prepared by: JHessie Diener Exercises - Seated Shoulder Flexion Towel Slide at Table Top  - 1 x daily - 7 x weekly - 1 sets - 10 reps - 5 hold - Seated Shoulder Abduction Towel Slide at Table Top  - 1 x daily - 7 x weekly - 1 sets - 10 reps - 5 hold - Seated Shoulder External Rotation PROM on Table  - 1 x daily - 7 x weekly - 1 sets - 10 reps - 5 hold - Standing Isometric Shoulder Internal Rotation at Doorway  - 1 x daily - 7 x weekly - 1 sets - 10 reps - 5 hold - Standing  Isometric Shoulder Extension with Doorway - Arm Bent  - 1 x daily - 7 x weekly - 1 sets - 10 reps - 5 hold - Standing Isometric Shoulder Flexion with Doorway - Arm Bent  - 1 x daily - 7 x weekly - 1 sets - 10 reps - 5 hold - Standing Isometric Shoulder Abduction with Doorway - Arm Bent  - 1 x daily - 7 x weekly - 3 sets - 10 reps - 5 hold - Standing Isometric Shoulder External Rotation with Doorway  - 1 x daily - 7 x weekly - 1 sets - 10 reps - 1 hold - Standing Shoulder Row with Anchored Resistance  - 1 x daily - 7 x weekly - 2 sets - 10 reps - 3 hold - Shoulder extension with resistance - Neutral  - 1 x daily - 7 x weekly - 2 sets - 10 reps - 3 hold - Seated Hamstring Stretch  - 1 x daily - 7 x weekly - 1 sets - 3 reps - 20 hold - Seated Piriformis Stretch (Mirrored)  - 1 x daily - 7 x weekly - 1 sets - 3 reps - 20 hold - Supine ITB Stretch with Strap  - 1 x daily - 7 x weekly - 3 sets - 3 reps - 20 hold - Sidelying Hip Abduction  - 1 x daily - 7 x weekly - 2-3 sets - 10 reps - 3 hold - Supine Bridge  - 1 x daily - 7 x weekly - 2-3 sets - 10 reps - 3 hold - Clamshell with Resistance  - 1 x daily - 7 x weekly - 2-3 sets - 10 reps - 3 hold - Shoulder External Rotation with Anchored Resistance  - 1 x daily - 7 x weekly - 2 sets - 10 reps - Shoulder Internal Rotation with Resistance  - 1 x daily - 7 x weekly - 2 sets - 10 reps - Single Arm Punch with Resistance  - 1 x daily - 7 x weekly - 2 sets - 10 reps     ASSESSMENT:   CLINICAL IMPRESSION: Pt reports upper trap pain is better after the TPDN last week. PT was completed for R shoulder strength including the rotators and periscapular strength. R shoulder strength is improving slowly. Eccentric control with lowering from flexion continues to demonstrate better control. Strengthening progress has been limited by the infraspinatus attachment not holding after surgery. Pt will continue to benefit from skilled PT to address strength for optimized r  shoulder function.  OBJECTIVE IMPAIRMENTS decreased activity tolerance, decreased ROM, decreased strength, impaired UE functional use, and pain.  GOALS:   SHORT TERM GOALS:   Pt will be ind in an initail HEP Baseline: strated on eval Target date: 03/26/2022 Goal status: MET   2.  Increase L shoulder PROM to flex 140, ER c arm by side to 40, and abd s rotation to 80d. 04/08/22=150,155, and 58 respectively Baseline: 120, 10, 60 respectively Target date: 04/02/2022 Goal status: MET   LONG TERM GOALS:   Pt will demonstrate PROM of the L shoulder 80% of the R Baseline: see flow sheets Target date: 07/10/22 Goal status: IMproving   2.  Pt will be able to complete AAROM exs indly Baseline:  Target date: 07/10/22 Goal status: Met   3.  Pt will be ind in an advanced HEP Baseline:  Target date: 07/10/22 Goal status: Progressing   4.  Develop progressive goals for the L shoulder as appropriate per shoulder rotator cuff protocol  Baseline:  Target date: 07/10/22 Goal status: Ongoing  5. Increase R hip strength to 4+/5 for improved function.  Baseline: 4-  Target date: 05/08/22  Goal Status: Deferred awaiting MRI R knee  6. Pt will report a decrease in R knee pain with daily activities to 2/10 or less. 05/08/22=0-2/10    Baseline: 5/10   Target date: 07/10/22   Goal Status: Improving PLAN: PT FREQUENCY: 2x per week   PT DURATION: 8 weeks   PLANNED INTERVENTIONS: Therapeutic exercises, Therapeutic activity, Neuromuscular re-education, Patient/Family education, Joint mobilization, Dry Needling, Electrical stimulation, Cryotherapy, Moist heat, Taping, Vasopneumatic device, Ultrasound, Ionotophoresis 32m/ml Dexamethasone, and Manual therapy   PLAN FOR NEXT SESSION: Initiate light weight L shoulder strengthening for HEP if tolerated in clinic.    Sadeen Wiegel MS, PT 06/25/22 1:38 PM

## 2022-06-25 ENCOUNTER — Ambulatory Visit: Payer: 59

## 2022-06-25 DIAGNOSIS — M6281 Muscle weakness (generalized): Secondary | ICD-10-CM

## 2022-06-25 DIAGNOSIS — G8929 Other chronic pain: Secondary | ICD-10-CM

## 2022-06-25 DIAGNOSIS — M25612 Stiffness of left shoulder, not elsewhere classified: Secondary | ICD-10-CM

## 2022-07-01 ENCOUNTER — Ambulatory Visit: Payer: 59 | Attending: Orthopaedic Surgery

## 2022-07-01 DIAGNOSIS — G8929 Other chronic pain: Secondary | ICD-10-CM | POA: Insufficient documentation

## 2022-07-01 DIAGNOSIS — M25512 Pain in left shoulder: Secondary | ICD-10-CM | POA: Diagnosis present

## 2022-07-01 DIAGNOSIS — M6281 Muscle weakness (generalized): Secondary | ICD-10-CM | POA: Insufficient documentation

## 2022-07-01 DIAGNOSIS — M25612 Stiffness of left shoulder, not elsewhere classified: Secondary | ICD-10-CM | POA: Diagnosis present

## 2022-07-01 DIAGNOSIS — M25561 Pain in right knee: Secondary | ICD-10-CM | POA: Insufficient documentation

## 2022-07-01 NOTE — Therapy (Signed)
OUTPATIENT PHYSICAL THERAPY TREATMENT NOTE   Patient Name: Gabriela Le MRN: [id number] DOB:[birth year], 46 y.o., female Today's Date: 07/01/2022       PCP: Marrian Salvage, [place] REFERRING PROVIDER: Marrian Salvage,*     PT End of Session - 07/01/22 1024     Visit Number 19    Number of Visits 28    Date for PT Re-Evaluation 07/10/22    Authorization Type [employer]    Progress Note Due on Visit 20    PT Start Time 1020    PT Stop Time 1050    PT Time Calculation (min) 30 min    Activity Tolerance Patient tolerated treatment well    Behavior During Therapy [facility] for tasks assessed/performed                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   Past Surgical History:  Procedure Laterality Date   APPENDECTOMY     6 YOA   CESAREAN SECTION  01/06/2013   Procedure: CESAREAN SECTION;  Surgeon: Eldred Manges, MD;  Location: [facility];  Service: Obstetrics;  Laterality: N/A;  Primary   CESAREAN SECTION N/A 10/09/2014   Procedure: CESAREAN SECTION;  Surgeon: Alinda Dooms, MD;  Location: [facility];  Service: Obstetrics;  Laterality: N/A;   CESAREAN SECTION N/A 08/09/2017   Procedure: REPEAT CESAREAN SECTION;  Surgeon: Lavonia Drafts, MD;  Location: [place];  Service: Obstetrics;  Laterality: N/A;   CHOLECYSTECTOMY N/A 11/27/2014   Procedure: LAPAROSCOPIC CHOLECYSTECTOMY WITH INTRAOPERATIVE CHOLANGIOGRAM;  Surgeon: Autumn Messing III, MD;  Location: [facility];  Service: General;  Laterality: N/A;   CYSTECTOMY     SHOULDER ARTHROSCOPY WITH ROTATOR CUFF REPAIR Left 02/19/2022   Procedure: LEFT SHOULDER ARTHROSCOPY WITH ROTATOR CUFF REPAIR;  Surgeon: Mcarthur Rossetti, MD;   Location: [employer] [facility];  Service: Orthopedics;  Laterality: Left;   SHOULDER ARTHROSCOPY WITH SUBACROMIAL DECOMPRESSION Left 02/27/2021   Procedure: LEFT SHOULDER ARTHROSCOPY WITH DEBRIDEMENT AND SUBACROMIAL DECOMPRESSION;  Surgeon: Mcarthur Rossetti, MD;  Location: [place];  Service: Orthopedics;  Laterality: Left;   Patient Active Problem List   Diagnosis Date Noted   Supraventricular tachycardia ([place]) 03/03/2022   Complete tear of left rotator cuff 02/19/2022   Dyspnea on exertion 11/27/2021   Dyslipidemia 11/27/2021   Irregular menses 11/25/2021   Infertility associated with anovulation 11/25/2021   H/O cold sores 11/25/2021   Palpitations 11/17/2021   Impingement syndrome of left shoulder 02/27/2021   Epidermoid cyst 11/04/2020   Candidal vulvovaginitis 11/04/2020   Late prenatal care in third trimester 06/23/2017   Supervision of high risk pregnancy, antepartum 02/15/2017   Advanced maternal age in multigravida, unspecified trimester 02/15/2017   Previous cesarean delivery, antepartum condition or complication 38/75/6433   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 Pt was not able to recall a specific incident of why.  PAIN:  Are you having pain? No NPRS scale: L shoulder- 0/10 L GH area; worst:2/10 brief. R knee 0/10 Pain location: L GH and upper trap Pain orientation: Left   PAIN TYPE: dull, sharp, and stabbing, heavy Pain description: constant  Aggravating factors: Out of sling for shower Relieving factors: cold packs L upper trap: 0/10  PERTINENT HISTORY: Previous arthroscopic surgery 02/27/21   PRECAUTIONS:  Sling DCed on 03/25/22/ following appt c Dr. 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 Handles light objects   PATIENT GOALS: For my L shoulder to function as close to normal as possible   OBJECTIVE:    DIAGNOSTIC FINDINGS:  IMPRESSION: L shoulder 10/10/21 1. Supraspinatus tendinosis with extensive irregular tear of the anterior and mid tendon with full-thickness components. This has significantly progressed compared to the previous study. Progressive supraspinatus muscle atrophy. 2. Intra-articular biceps tendinosis. 3. Moderate subacromial-subdeltoid bursitis.  MRI R knee 05/10/22: Lateral meniscus: The anterior horn is degenerated and torn and has a macerated appearance. The posterior horn is intact.   PATIENT SURVEYS:  FOTO TBA   COGNITION:          Overall cognitive status: Within functional limits for tasks assessed                               SENSATION:          Light touch: Appears intact             POSTURE: Forward head, rounded shoulder   UPPER EXTREMITY PROM:   A/PROM Right 03/05/2022 Left 03/05/2022 Left 03/18/22 Left 04/08/22 Left 04/22/22 Lt 05/08/22 LT 06/02/22  Shoulder flexion   120 140 tight end feel 150 155 A=145 c min shrug 140  Shoulder extension           Shoulder abduction   60 80 empty end feel 153 155  120  Shoulder adduction           Shoulder internal rotation         Reaches Lower thoracic  Shoulder external rotation   10 arm by side 40 c arm by side. Tight end feel c discomfort 58 65  Reaches T2  Elbow flexion   WNLS       Elbow extension   WNLS       Wrist flexion   WNLS       Wrist extension   WNLS       Wrist ulnar deviation           Wrist radial deviation            Wrist pronation           Wrist supination           (Blank rows = not tested)   UPPER EXTREMITY MMT: Not assessed MMT Right 03/05/2022 Left 03/05/2022  Shoulder flexion      Shoulder extension      Shoulder abduction      Shoulder adduction      Shoulder internal rotation      Shoulder external rotation      Middle trapezius      Lower trapezius      Elbow flexion      Elbow  extension      Wrist flexion      Wrist extension      Wrist ulnar deviation      Wrist radial deviation      Wrist pronation      Wrist supination      Grip strength (lbs)      (Blank rows = not tested)  04/02/22 R Knee Assessment : TTP post lat R knee in area of hamstring insertion R Knee strength 5/5, hip strength 4-/5 (L 4+/5) AROM of the R knee is WNLs  07/01/22 re-assessment Active L shoulder flexion min pain and decreased ROM in comparison to prior appts [facility] - neg Open can and empty can - pain, but not overt weakness            TODAY'S TREATMENT [facility] Adult PT Treatment:                                                DATE: 07/01/22 Therapeutic Exercise: UBE Level 1 2 min each way Wall slide on lateral hand 2x10 for eccentric control Side lying L shoulder ER 3x10 2# Side lying L shoulder scaption 3x10 1# Shoulder row  3x10 GTB Shoulder ext 3x10 GTB Modalities: Iontophoresis L post/lat GH area, 6 hrs, 27m/ml, 1 ml. Advised not to get wet and of possible skin reaction. Pt voiced understanding.  [facility]:                                                DATE: 06/25/22 Therapeutic Exercise: UBE Level 1 2 min each way Circle c ball on wall 2x20 each direction Wall slide on lateral hand 2x10 for eccentric control Side lying L shoulder ER 3x10 3# Side lying L shoulder scaption 3x10 2# Serratus wall slides c pillow case 2x10 Serratus wall presses 2x20 Shoulder ER c RTB 3x10 Shoulder row  3x10 GTB Shoulder ext 3x10 GTB Lower Trap 3x 10 GTB   [facility] Adult PT  Treatment:                                                DATE: 06/17/22 Therapeutic Exercise: UBE Level 1 2 min each way Shoulder flexion to 90d c 1lb 3x10 min shrug Circle c ball on wall 2x10 each direction Wall slide on lateral hand 2x10 for eccentric control Side lying L shoulder ER 3x10 2# Shoulder row  3x10 GTB Shoulder ext 3x10 GTB Lower Trap 3x 10 GTB  Manual Therapy: STM to the upper trap Skilled palapation of the of TRP and taut bands of the L    Trigger Point Dry Needling Treatment: Pre-treatment instruction: Patient instructed on dry needling rationale, procedures, and possible side effects including pain during treatment (achy,cramping feeling), bruising, drop of blood, lightheadedness, nausea, sweating. Patient Consent Given: Yes Education handout provided: Yes Muscles treated: L upper trap  Needle size and number: .[id number] needle Electrical stimulation performed: No Parameters: N/A Treatment response/outcome: Twitch response elicited and Palpable decrease in muscle tension Post-treatment instructions: Patient instructed to expect possible mild to moderate muscle soreness later today  and/or tomorrow. Patient instructed in methods to reduce muscle soreness and to continue prescribed HEP. If patient was dry needled over the lung field, patient was instructed on signs and symptoms of pneumothorax and, however unlikely, to see immediate medical attention should they occur. Patient was also educated on signs and symptoms of infection and to seek medical attention should they occur. Patient verbalized understanding of these instructions and education.     HOME EXERCISE PROGRAM: Access Code: [email or web address] URL: [email or web address] Date: 06/02/2022 Prepared by: Hessie Diener  Exercises - Seated Shoulder Flexion Towel Slide at Table Top  - 1 x daily - 7 x weekly - 1 sets - 10 reps - 5 hold - Seated Shoulder Abduction Towel Slide at Table Top  - 1 x daily - 7 x  weekly - 1 sets - 10 reps - 5 hold - Seated Shoulder External Rotation PROM on Table  - 1 x daily - 7 x weekly - 1 sets - 10 reps - 5 hold - Standing Isometric Shoulder Internal Rotation at Doorway  - 1 x daily - 7 x weekly - 1 sets - 10 reps - 5 hold - Standing Isometric Shoulder Extension with Doorway - Arm Bent  - 1 x daily - 7 x weekly - 1 sets - 10 reps - 5 hold - Standing Isometric Shoulder Flexion with Doorway - Arm Bent  - 1 x daily - 7 x weekly - 1 sets - 10 reps - 5 hold - Standing Isometric Shoulder Abduction with Doorway - Arm Bent  - 1 x daily - 7 x weekly - 3 sets - 10 reps - 5 hold - Standing Isometric Shoulder External Rotation with Doorway  - 1 x daily - 7 x weekly - 1 sets - 10 reps - 1 hold - Standing Shoulder Row with Anchored Resistance  - 1 x daily - 7 x weekly - 2 sets - 10 reps - 3 hold - Shoulder extension with resistance - Neutral  - 1 x daily - 7 x weekly - 2 sets - 10 reps - 3 hold - Seated Hamstring Stretch  - 1 x daily - 7 x weekly - 1 sets - 3 reps - 20 hold - Seated Piriformis Stretch (Mirrored)  - 1 x daily - 7 x weekly - 1 sets - 3 reps - 20 hold - Supine ITB Stretch with Strap  - 1 x daily - 7 x weekly - 3 sets - 3 reps - 20 hold - Sidelying Hip Abduction  - 1 x daily - 7 x weekly - 2-3 sets - 10 reps - 3 hold - Supine Bridge  - 1 x daily - 7 x weekly - 2-3 sets - 10 reps - 3 hold - Clamshell with Resistance  - 1 x daily - 7 x weekly - 2-3 sets - 10 reps - 3 hold - Shoulder External Rotation with Anchored Resistance  - 1 x daily - 7 x weekly - 2 sets - 10 reps - Shoulder Internal Rotation with Resistance  - 1 x daily - 7 x weekly - 2 sets - 10 reps - Single Arm Punch with Resistance  - 1 x daily - 7 x weekly - 2 sets - 10 reps     ASSESSMENT:   CLINICAL IMPRESSION: Pt reports experiencing more L shoulder pain over the last 2 days. Pt has greater difficulty c lifting and lowering her L UE today in comparison to previous sessions. Pain was provoked c full and  empty can testung without overt weakness. PT was completed today for L rotator cuff and periscapular strengthening with less demand than last PT session. Pt tolerated PT today without adverse effects. Iontophoresis was applied at the end of the session to address recent increase in R [place] area pain. Pt tolerated the session without adverse effects.  OBJECTIVE IMPAIRMENTS decreased activity tolerance, decreased ROM, decreased strength, impaired UE functional use, and pain.     GOALS:   SHORT TERM GOALS:   Pt will be ind in an initail HEP Baseline: strated on eval Target date: 03/26/2022 Goal status: MET   2.  Increase L shoulder PROM to flex 140, ER c arm by side to 40, and abd s rotation to 80d. 04/08/22=150,155, and 58 respectively Baseline: 120, 10, 60 respectively Target date: 04/02/2022 Goal status: MET   LONG TERM GOALS:   Pt will demonstrate PROM of the L shoulder 80% of the R Baseline: see flow sheets Target date: 07/10/22 Goal status: Improving   2.  Pt will be able to complete AAROM exs indly Baseline:  Target date: 07/10/22 Goal status: Met   3.  Pt will be ind in an advanced HEP Baseline:  Target date: 07/10/22 Goal status: Progressing   4.  Develop progressive goals for the L shoulder as appropriate per shoulder rotator cuff protocol  Baseline:  Target date: 07/10/22 Goal status: Ongoing  5. Increase R hip strength to 4+/5 for improved function.  Baseline: 4-  Target date: 05/08/22  Goal Status: Deferred awaiting MRI R knee  6. Pt will report a decrease in R knee pain with daily activities to 2/10 or less. 05/08/22=0-2/10    Baseline: 5/10   Target date: 07/10/22   Goal Status: Improving PLAN: PT FREQUENCY: 2x per week   PT DURATION: 8 weeks   PLANNED INTERVENTIONS: Therapeutic exercises, Therapeutic activity, Neuromuscular re-education, Patient/Family education, Joint mobilization, Dry Needling, Electrical stimulation, Cryotherapy, Moist heat, Taping,  Vasopneumatic device, Ultrasound, Ionotophoresis 32m/ml Dexamethasone, and Manual therapy   PLAN FOR NEXT SESSION: Initiate light weight L shoulder strengthening for HEP if tolerated in clinic.    Kayzen Kendzierski MS, PT 07/01/22 5:34 PM

## 2022-07-03 ENCOUNTER — Ambulatory Visit: Payer: 59

## 2022-07-08 ENCOUNTER — Ambulatory Visit: Payer: 59

## 2022-07-08 DIAGNOSIS — M6281 Muscle weakness (generalized): Secondary | ICD-10-CM | POA: Diagnosis not present

## 2022-07-08 DIAGNOSIS — G8929 Other chronic pain: Secondary | ICD-10-CM

## 2022-07-08 DIAGNOSIS — M25612 Stiffness of left shoulder, not elsewhere classified: Secondary | ICD-10-CM

## 2022-07-08 NOTE — Therapy (Signed)
OUTPATIENT PHYSICAL THERAPY TREATMENT NOTE/Progress Note/Discharge   Patient Name: Gabriela Le MRN: [id number] DOB:08-Nov-1976, 46 y.o., female Today's Date: 07/08/2022       PCP: Marrian Salvage, [place] REFERRING PROVIDER: Marrian Salvage,*  Progress Note Reporting Period 04/30/22 to 07/08/22  See note below for Objective Data and Assessment of Progress/Goals.       PT End of Session - 07/08/22 1151     Visit Number 20    Number of Visits 28    Date for PT Re-Evaluation 07/10/22    Authorization Type [employer]    Progress Note Due on Visit 20    PT Start Time 1151    PT Stop Time 1235    PT Time Calculation (min) 44 min    Activity Tolerance Patient tolerated treatment well                 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   Past Surgical History:  Procedure Laterality Date   APPENDECTOMY     6 YOA   CESAREAN SECTION  01/06/2013   Procedure: CESAREAN SECTION;  Surgeon: Eldred Manges, MD;  Location: [facility];  Service: Obstetrics;  Laterality: N/A;  Primary   CESAREAN SECTION N/A 10/09/2014   Procedure: CESAREAN SECTION;  Surgeon: Alinda Dooms, MD;  Location: [facility];  Service: Obstetrics;  Laterality: N/A;   CESAREAN SECTION N/A 08/09/2017   Procedure: REPEAT CESAREAN SECTION;  Surgeon: Lavonia Drafts, MD;  Location: [place];  Service: Obstetrics;  Laterality: N/A;   CHOLECYSTECTOMY N/A 11/27/2014   Procedure: LAPAROSCOPIC CHOLECYSTECTOMY WITH INTRAOPERATIVE CHOLANGIOGRAM;  Surgeon: Autumn Messing III, MD;  Location: [facility];  Service: General;  Laterality: N/A;   CYSTECTOMY     SHOULDER ARTHROSCOPY WITH ROTATOR CUFF REPAIR Left 02/19/2022   Procedure: LEFT  SHOULDER ARTHROSCOPY WITH ROTATOR CUFF REPAIR;  Surgeon: Mcarthur Rossetti, MD;  Location: [place];  Service: Orthopedics;  Laterality: Left;   SHOULDER ARTHROSCOPY WITH SUBACROMIAL DECOMPRESSION Left 02/27/2021   Procedure: LEFT SHOULDER ARTHROSCOPY WITH DEBRIDEMENT AND SUBACROMIAL DECOMPRESSION;  Surgeon: Mcarthur Rossetti, MD;  Location: [place];  Service: Orthopedics;  Laterality: Left;   Patient Active Problem List   Diagnosis Date Noted   Supraventricular tachycardia ([place]) 03/03/2022   Complete tear of left rotator cuff 02/19/2022   Dyspnea on exertion 11/27/2021   Dyslipidemia 11/27/2021   Irregular menses 11/25/2021   Infertility associated with anovulation 11/25/2021   H/O cold sores 11/25/2021   Palpitations 11/17/2021   Impingement syndrome of left shoulder 02/27/2021   Epidermoid cyst 11/04/2020   Candidal vulvovaginitis 11/04/2020   Late prenatal care in third trimester 06/23/2017   Supervision of high risk pregnancy, antepartum 02/15/2017   Advanced maternal age in multigravida, unspecified trimester 02/15/2017   Previous cesarean delivery, antepartum condition or complication 78/29/5621   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 Pt does report TTP on the idstal aspect of the acromion.  PAIN:  Are you having pain? No NPRS scale: L  shoulder- 0/10 L GH area; worst:0-2/10 brief. R knee 0/10 Pain location: L GH and upper trap Pain orientation: Left  PAIN TYPE: dull, sharp, and stabbing, heavy Pain description: constant  Aggravating factors: Out of sling for shower Relieving factors: cold packs L upper trap: 0/10  PERTINENT HISTORY: Previous arthroscopic surgery 02/27/21   PRECAUTIONS:  Sling DCed on 03/25/22/ following appt c Dr. 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 Handles light objects   PATIENT GOALS: For my L shoulder to function as close to normal as possible   OBJECTIVE:    DIAGNOSTIC FINDINGS:  IMPRESSION: L shoulder 10/10/21 1. Supraspinatus tendinosis with extensive irregular tear of the anterior and mid tendon with full-thickness components. This has significantly progressed compared to the previous study. Progressive supraspinatus muscle atrophy. 2. Intra-articular biceps tendinosis. 3. Moderate subacromial-subdeltoid bursitis.  MRI R knee 05/10/22: Lateral meniscus: The anterior horn is degenerated and torn and has a macerated appearance. The posterior horn is intact.   PATIENT SURVEYS:  FOTO TBA   COGNITION:          Overall cognitive status: Within functional limits for tasks assessed                               SENSATION:          Light touch: Appears intact             POSTURE: Forward head, rounded shoulder   UPPER EXTREMITY PROM:   A/PROM Right 03/05/2022 Left 03/05/2022 Left 03/18/22 Left 04/08/22 Left 04/22/22 Lt 05/08/22 LT 06/02/22  Shoulder flexion   120 140 tight end feel 150 155 A=145 c min shrug 140  Shoulder extension           Shoulder abduction   60 80 empty end feel 153 155  120  Shoulder adduction           Shoulder internal rotation         Reaches Lower thoracic  Shoulder external rotation   10 arm by side 40 c arm by side. Tight end feel c  discomfort 58 65  Reaches T2  Elbow flexion   WNLS       Elbow extension   WNLS        Wrist flexion   WNLS       Wrist extension   WNLS       Wrist ulnar deviation           Wrist radial deviation           Wrist pronation           Wrist supination            A/PROM LT 07/08/2022        Shoulder flexion  150 min shrug        Shoulder extension          Shoulder abduction  140 min shrug        Shoulder adduction          Shoulder internal rotation  Reaches Lower thoracic        Shoulder external rotation  Reaches T2        Elbow flexion          Elbow extension          Wrist flexion          Wrist extension          Wrist ulnar deviation          Wrist radial deviation          Wrist pronation          Wrist supination            (Blank rows = not tested)   UPPER EXTREMITY MMT: Not assessed MMT Lt 07/08/22   Shoulder flexion  3+   Shoulder extension  4+   Shoulder abduction  3   Shoulder adduction  4+   Shoulder internal rotation  4+   Shoulder external rotation  3+   Middle trapezius     Lower trapezius     Elbow flexion     Elbow extension     Wrist flexion     Wrist extension     Wrist ulnar deviation     Wrist radial deviation     Wrist pronation     Wrist supination     Grip strength (lbs)     (Blank rows = not tested)  04/02/22 R Knee Assessment : TTP post lat R knee in area of hamstring insertion R Knee strength 5/5, hip strength 4-/5 (L 4+/5) AROM of the R knee is WNLs  07/01/22 re-assessment Active L shoulder flexion min pain and decreased ROM in comparison to prior appts [facility] - neg Open can and empty can - pain, but not overt weakness            TODAY'S TREATMENT [facility] Adult PT Treatment:                                                DATE: 07/08/22 Therapeutic Exercise: Shoulder row  2x10 GTB Shoulder ext 2x10 GTB Shoulder ER 2x10 RTB Shoulder IR 2x10 RTB Serratus wall slides 2x10 c static shoulder abd RTB AROM L shoulder MMT L shoulder Final HEP   [facility] Adult PT Treatment:  DATE: 07/01/22 Therapeutic Exercise: UBE Level 1 2 min each way Wall slide on lateral hand 2x10 for eccentric control Side lying L shoulder ER 3x10 2# Side lying L shoulder scaption 3x10 1# Shoulder row  3x10 GTB Shoulder ext 3x10 GTB Modalities: Iontophoresis L post/lat GH area, 6 hrs, 59m/ml, 1 ml. Advised not to get wet and of possible skin reaction. Pt voiced understanding.  [facility]:                                                DATE: 06/25/22 Therapeutic Exercise: UBE Level 1 2 min each way Circle c ball on wall 2x20 each direction Wall slide on lateral hand 2x10 for eccentric control Side lying L shoulder ER 3x10 3# Side lying L shoulder scaption 3x10 2# Serratus wall slides c pillow case 2x10 Serratus wall presses 2x20 Shoulder ER c RTB 3x10 Shoulder row  3x10 GTB Shoulder ext 3x10 GTB Lower Trap 3x 10 GTB     HOME EXERCISE PROGRAM: Access Code: [email or web address] URL: [email or web address] Date: 07/08/2022 Prepared by: AGar Ponto Exercises - Shoulder extension with resistance - Neutral  - 1 x daily - 7 x weekly - 2 sets - 10 reps - 3 hold - Standing Shoulder Row with Anchored Resistance  - 1 x daily - 7 x weekly - 2 sets - 10 reps - 3 hold - Shoulder External Rotation with Anchored Resistance  - 1 x daily - 7 x weekly - 2 sets - 10 reps - Shoulder Internal Rotation with Resistance  - 1 x daily - 7 x weekly - 2 sets - 10 reps - Serratus Activation at Wall  - 1 x daily - 7 x weekly - 2 sets - 5-10 reps     ASSESSMENT:   CLINICAL IMPRESSION: Pt reports L shoulder pain has returned back to the usual level after experiencing an increase last week, generally 0/10 with intermittent low pain. Pt's L shoulder strength and function has made good progress since the initiation of PT, but has improved slowly and weakness is still present. Pt is able to raise her L hand overhead and lower, flexion c greater ease than abd. At this time, pt would like to  be Dced  to a HEP to continue with strengthening exs at home. [place] and with pt Ind in a final HEP  OBJECTIVE IMPAIRMENTS decreased activity tolerance, decreased ROM, decreased strength, impaired UE functional use, and pain.     GOALS:   SHORT TERM GOALS:   Pt will be ind in an initail HEP Baseline: strated on eval Target date: 03/26/2022 Goal status: MET   2.  Increase L shoulder PROM to flex 140, ER c arm by side to 40, and abd s rotation to 80d. 04/08/22=150,155, and 58 respectively Baseline: 120, 10, 60 respectively Target date: 04/02/2022 Goal status: MET   LONG TERM GOALS:   Pt will demonstrate PROM of the L shoulder 80% of the R Baseline: see flow sheets Target date: 07/10/22 Goal status: Met   2.  Pt will be able to complete AAROM exs indly Baseline:  Target date: 07/10/22 Goal status: Met   3.  Pt will be ind in an advanced HEP Baseline:  Target date: 07/10/22 Goal status: Met   4.  Develop progressive  goals for the L shoulder as appropriate per shoulder rotator cuff protocol  Baseline:  Target date: 07/10/22 Goal status: Deferred  5. Increase R hip strength to 4+/5 for improved function.  Baseline: 4-  Target date: 05/08/22  Goal Status: Deferred awaiting MRI R knee  6. Pt will report a decrease in R knee pain with daily activities to 2/10 or less. 05/08/22=0-2/10    Baseline: 5/10   Target date: 07/10/22   Goal Status:Met- improved following cortisone injection PLAN: PT FREQUENCY: 2x per week   PT DURATION: 8 weeks   PLANNED INTERVENTIONS: Therapeutic exercises, Therapeutic activity, Neuromuscular re-education, Patient/Family education, Joint mobilization, Dry Needling, Electrical stimulation, Cryotherapy, Moist heat, Taping, Vasopneumatic device, Ultrasound, Ionotophoresis 68m/ml Dexamethasone, and Manual therapy   PLAN FOR NEXT SESSION:   PHYSICAL THERAPY DISCHARGE SUMMARY  Visits from Start of Care:  20  Current functional level related to goals / functional outcomes: See clinical impression and goals   Remaining deficits: See clinical impression and goals   Education / Equipment: HEP   Patient agrees to discharge. Patient goals were  majority of goals met . Patient is being discharged due to being pleased with the current functional level.    Brynda Heick MS, PT 07/08/22 1:59 PM

## 2022-10-07 ENCOUNTER — Ambulatory Visit: Payer: 59 | Admitting: Dermatology

## 2023-01-14 ENCOUNTER — Other Ambulatory Visit: Payer: Self-pay | Admitting: Family

## 2023-03-02 ENCOUNTER — Telehealth: Payer: Self-pay | Admitting: Family

## 2023-03-02 ENCOUNTER — Ambulatory Visit (INDEPENDENT_AMBULATORY_CARE_PROVIDER_SITE_OTHER): Payer: BLUE CROSS/BLUE SHIELD | Admitting: Family

## 2023-03-02 ENCOUNTER — Encounter: Payer: Self-pay | Admitting: Family

## 2023-03-02 VITALS — BP 114/64 | HR 76 | Resp 18 | Ht 62.0 in | Wt 121.4 lb

## 2023-03-02 DIAGNOSIS — D509 Iron deficiency anemia, unspecified: Secondary | ICD-10-CM | POA: Diagnosis not present

## 2023-03-02 DIAGNOSIS — J309 Allergic rhinitis, unspecified: Secondary | ICD-10-CM

## 2023-03-02 DIAGNOSIS — R5383 Other fatigue: Secondary | ICD-10-CM | POA: Diagnosis not present

## 2023-03-02 DIAGNOSIS — M7542 Impingement syndrome of left shoulder: Secondary | ICD-10-CM

## 2023-03-02 DIAGNOSIS — Z13220 Encounter for screening for lipoid disorders: Secondary | ICD-10-CM | POA: Diagnosis not present

## 2023-03-02 DIAGNOSIS — Z1231 Encounter for screening mammogram for malignant neoplasm of breast: Secondary | ICD-10-CM

## 2023-03-02 MED ORDER — TIZANIDINE HCL 4 MG PO TABS: ORAL_TABLET | ORAL | 1 refills | Status: DC

## 2023-03-02 MED ORDER — TIZANIDINE HCL 4 MG PO TABS: ORAL_TABLET | ORAL | 0 refills | Status: DC

## 2023-03-02 MED ORDER — LEVOCETIRIZINE DIHYDROCHLORIDE 5 MG PO TABS: 5.0000 mg | ORAL_TABLET | Freq: Every evening | ORAL | 1 refills | Status: DC

## 2023-03-02 MED ORDER — METHYLPREDNISOLONE 4 MG PO TBPK: ORAL_TABLET | ORAL | 0 refills | Status: DC

## 2023-03-02 NOTE — Progress Notes (Signed)
Gabriela Le is a 47 y.o. female with the following history as recorded in EpicCare:  Patient Active Problem List   Diagnosis Date Noted   Supraventricular tachycardia 03/03/2022   Complete tear of left rotator cuff 02/19/2022   Dyspnea on exertion 11/27/2021   Dyslipidemia 11/27/2021   Irregular menses 11/25/2021   Infertility associated with anovulation 11/25/2021   H/O cold sores 11/25/2021   Palpitations 11/17/2021   Impingement syndrome of left shoulder 02/27/2021   Epidermoid cyst 11/04/2020   Candidal vulvovaginitis 11/04/2020   Late prenatal care in third trimester 06/23/2017   Supervision of high risk pregnancy, antepartum 02/15/2017   Advanced maternal age in multigravida, unspecified trimester 02/15/2017   Previous cesarean delivery, antepartum condition or complication [id number]   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 16 g 6   levocetirizine (XYZAL) 5 MG tablet Take 1 tablet (5 mg total) by mouth every evening. 90 tablet 1   methylPREDNISolone (MEDROL DOSEPAK) 4 MG TBPK tablet Taper as directed 21 tablet 0   vitamin B-12 (CYANOCOBALAMIN) 100 MCG tablet Take 100 mcg by mouth daily.     VITAMIN D PO Take 1 tablet by mouth daily.     clobetasol (OLUX) 0.05 % topical foam Apply topically 2 (two) times daily. (Patient not taking: Reported on 03/02/2023) 50 g 5   HYDROcodone-acetaminophen (NORCO) 7.5-325 MG tablet Take 1-2 tablets by mouth every 6 (six) hours as needed for moderate pain. (Patient not taking: Reported on 04/07/2022) 30 tablet 0   tiZANidine (ZANAFLEX) 4 MG tablet TAKE 1 TABLET(4 MG) BY MOUTH EVERY 8 HOURS AS NEEDED FOR MUSCLE SPASMS 30 tablet 1   No current facility-administered medications for this visit.    Allergies: Patient has no  known allergies.  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;  Service: Obstetrics;  Laterality: N/A;  Primary   CESAREAN SECTION N/A 10/09/2014   Procedure: CESAREAN SECTION;  Surgeon: Alinda Dooms, MD;  Location: [facility];  Service: Obstetrics;  Laterality: N/A;   CESAREAN SECTION N/A 08/09/2017   Procedure: REPEAT CESAREAN SECTION;  Surgeon: Lavonia Drafts, MD;  Location: [place];  Service: Obstetrics;  Laterality: N/A;   CHOLECYSTECTOMY N/A 11/27/2014   Procedure: LAPAROSCOPIC CHOLECYSTECTOMY WITH INTRAOPERATIVE CHOLANGIOGRAM;  Surgeon: Autumn Messing III, MD;  Location: [facility];  Service: General;  Laterality: N/A;   CYSTECTOMY     SHOULDER ARTHROSCOPY WITH ROTATOR CUFF REPAIR Left 02/19/2022   Procedure: LEFT SHOULDER ARTHROSCOPY WITH ROTATOR CUFF REPAIR;  Surgeon: Mcarthur Rossetti, MD;  Location: [place];  Service: Orthopedics;  Laterality: Left;   SHOULDER ARTHROSCOPY WITH SUBACROMIAL DECOMPRESSION Left 02/27/2021   Procedure: LEFT SHOULDER ARTHROSCOPY WITH DEBRIDEMENT AND SUBACROMIAL DECOMPRESSION;  Surgeon: Mcarthur Rossetti, MD;  Location:  [place];  Service: Orthopedics;  Laterality: Left;    Family History  Problem Relation Age of Onset   Heart disease Mother        PALPITATIONS   Hypertension Mother     Social History   Tobacco Use   Smoking status: Never   Smokeless tobacco: Never  Substance Use Topics   Alcohol use: No    Subjective:   2 month history of right ear pain;  feels like related to allergies;  Also needs to get order for screening mammogram and would like to come back for fasting labs;   Objective:  Vitals:   03/02/23 1031  BP: 114/64  Pulse: 76  Resp: 18  SpO2: 100%  Weight: 121 lb 6.4 oz (55.1 kg)  Height: '5\' 2"'$  (1.575 m)    General: Well developed, well nourished, in no acute distress  Skin : Warm and dry.  Head: Normocephalic and atraumatic  Eyes: Sclera and conjunctiva clear; pupils round and reactive to light; extraocular movements intact  Ears: External normal; canals clear; tympanic membranes congested bilaterally Oropharynx: Pink, supple. No suspicious lesions  Neck: Supple without thyromegaly, adenopathy  Lungs: Respirations unlabored; clear to auscultation bilaterally without wheeze, rales, rhonchi  CVS exam: normal rate and regular rhythm.  Neurologic: Alert and oriented; speech intact; face symmetrical; moves all extremities well; CNII-XII intact without focal deficit   Assessment:  1. Visit for screening mammogram   2. Other fatigue   3. Iron deficiency anemia, unspecified iron deficiency anemia type   4. Lipid screening   5. Allergic rhinitis, unspecified seasonality, unspecified trigger   6. Impingement syndrome of left shoulder     Plan:  Order updated;  She will return for fasting labs; 5.   D/C Zyrtec- change to Xyzal; trial of Medrol dose pak; may need to refer to ENT if symptoms persist;  6.  Refill updated on muscle relaxer;    She is overdue for pap smear and colon cancer screening- will need to get these updated;   Return for follow up fasting labs at patient's convenience.  Orders Placed This Encounter  Procedures   MM Digital Screening    Standing Status:   Future    Standing Expiration Date:   03/01/2024    Order Specific Question:   Reason for Exam (SYMPTOM  OR DIAGNOSIS REQUIRED)    Answer:   screening mammogram    Order Specific Question:   Is the patient pregnant?    Answer:   No    Order  Specific Question:   Preferred imaging location?    Answer:   [facility]   CBC with Differential/Platelet    Standing Status:   Future    Standing Expiration Date:   03/01/2024   Comp Met (CMET)    Standing Status:   Future    Standing Expiration Date:   03/01/2024   Lipid panel    Standing Status:   Future    Standing Expiration Date:   03/01/2024   TSH    Standing Status:   Future    Standing Expiration Date:   03/01/2024   Iron, TIBC and Ferritin Panel    Standing Status:   Future    Standing Expiration Date:   03/01/2024    Requested Prescriptions   Signed Prescriptions Disp Refills   levocetirizine (XYZAL) 5 MG tablet 90 tablet 1    Sig: Take 1 tablet (5 mg total) by mouth every evening.   methylPREDNISolone (MEDROL  DOSEPAK) 4 MG TBPK tablet 21 tablet 0    Sig: Taper as directed   tiZANidine (ZANAFLEX) 4 MG tablet 30 tablet 1    Sig: TAKE 1 TABLET(4 MG) BY MOUTH EVERY 8 HOURS AS NEEDED FOR MUSCLE SPASMS

## 2023-03-02 NOTE — Telephone Encounter (Signed)
She is overdue for pap smear- last done in 2018; does she have GYN?  Also due for colon cancer screening- would she prefer colonoscopy or Cologuard?

## 2023-03-03 ENCOUNTER — Other Ambulatory Visit (INDEPENDENT_AMBULATORY_CARE_PROVIDER_SITE_OTHER): Payer: BLUE CROSS/BLUE SHIELD

## 2023-03-03 ENCOUNTER — Other Ambulatory Visit: Payer: Self-pay | Admitting: Family

## 2023-03-03 DIAGNOSIS — Z124 Encounter for screening for malignant neoplasm of cervix: Secondary | ICD-10-CM

## 2023-03-03 DIAGNOSIS — R5383 Other fatigue: Secondary | ICD-10-CM

## 2023-03-03 DIAGNOSIS — Z13220 Encounter for screening for lipoid disorders: Secondary | ICD-10-CM | POA: Diagnosis not present

## 2023-03-03 DIAGNOSIS — D509 Iron deficiency anemia, unspecified: Secondary | ICD-10-CM | POA: Diagnosis not present

## 2023-03-03 LAB — LIPID PANEL
Cholesterol: 188 mg/dL (ref 0–200)
HDL: 49.3 mg/dL
LDL Cholesterol: 112 mg/dL — ABNORMAL HIGH (ref 0–99)
NonHDL: 138.93
Total CHOL/HDL Ratio: 4
Triglycerides: 134 mg/dL (ref 0.0–149.0)
VLDL: 26.8 mg/dL (ref 0.0–40.0)

## 2023-03-03 LAB — CBC WITH DIFFERENTIAL/PLATELET
Basophils Absolute: 0 K/uL (ref 0.0–0.1)
Basophils Relative: 0.4 % (ref 0.0–3.0)
Eosinophils Absolute: 0 K/uL (ref 0.0–0.7)
Eosinophils Relative: 0.6 % (ref 0.0–5.0)
HCT: 39.5 % (ref 36.0–46.0)
Hemoglobin: 12.9 g/dL (ref 12.0–15.0)
Lymphocytes Relative: 40.6 % (ref 12.0–46.0)
Lymphs Abs: 2.7 K/uL (ref 0.7–4.0)
MCHC: 32.5 g/dL (ref 30.0–36.0)
MCV: 91.8 fl (ref 78.0–100.0)
Monocytes Absolute: 0.3 K/uL (ref 0.1–1.0)
Monocytes Relative: 4.4 % (ref 3.0–12.0)
Neutro Abs: 3.6 K/uL (ref 1.4–7.7)
Neutrophils Relative %: 54 % (ref 43.0–77.0)
Platelets: 248 K/uL (ref 150.0–400.0)
RBC: 4.31 Mil/uL (ref 3.87–5.11)
RDW: 14.2 % (ref 11.5–15.5)
WBC: 6.6 K/uL (ref 4.0–10.5)

## 2023-03-03 LAB — COMPREHENSIVE METABOLIC PANEL WITH GFR
ALT: 7 U/L (ref 0–35)
AST: 10 U/L (ref 0–37)
Albumin: 3.7 g/dL (ref 3.5–5.2)
Alkaline Phosphatase: 62 U/L (ref 39–117)
BUN: 15 mg/dL (ref 6–23)
CO2: 30 meq/L (ref 19–32)
Calcium: 8.9 mg/dL (ref 8.4–10.5)
Chloride: 105 meq/L (ref 96–112)
Creatinine, Ser: 0.53 mg/dL (ref 0.40–1.20)
GFR: 110.32 mL/min
Glucose, Bld: 85 mg/dL (ref 70–99)
Potassium: 4.4 meq/L (ref 3.5–5.1)
Sodium: 142 meq/L (ref 135–145)
Total Bilirubin: 0.3 mg/dL (ref 0.2–1.2)
Total Protein: 6.4 g/dL (ref 6.0–8.3)

## 2023-03-03 LAB — TSH: TSH: 1 u[IU]/mL (ref 0.35–5.50)

## 2023-03-03 NOTE — Telephone Encounter (Signed)
Spoke w/ Gabriela Le- he informed that his wife currently doesn't have a GYN- they are okay with a referral (across the hall please) for pap smear. He stated he hasn't spoke w/ her about preference of colonoscopy or Cologuard but currently not interested- he will let us know if that changes.

## 2023-03-03 NOTE — Telephone Encounter (Signed)
Spoke with pt and she stated that she could not understand and to call husband. Left message on machine for husband to call back,

## 2023-03-04 LAB — IRON,TIBC AND FERRITIN PANEL
%SAT: 15 % (calc) — ABNORMAL LOW (ref 16–45)
Ferritin: 8 ng/mL — ABNORMAL LOW (ref 16–232)
Iron: 57 ug/dL (ref 40–190)
TIBC: 369 mcg/dL (calc) (ref 250–450)

## 2023-03-08 ENCOUNTER — Encounter (HOSPITAL_BASED_OUTPATIENT_CLINIC_OR_DEPARTMENT_OTHER): Payer: Self-pay

## 2023-03-08 ENCOUNTER — Ambulatory Visit (HOSPITAL_BASED_OUTPATIENT_CLINIC_OR_DEPARTMENT_OTHER)
Admission: RE | Admit: 2023-03-08 | Discharge: 2023-03-08 | Disposition: A | Discharge status: Home / Self Care | Payer: BLUE CROSS/BLUE SHIELD | Source: Ambulatory Visit | Attending: Family | Admitting: Family

## 2023-03-08 DIAGNOSIS — Z1231 Encounter for screening mammogram for malignant neoplasm of breast: Secondary | ICD-10-CM | POA: Diagnosis not present

## 2023-04-29 ENCOUNTER — Telehealth: Payer: BLUE CROSS/BLUE SHIELD | Admitting: Physician Assistant

## 2023-04-29 DIAGNOSIS — J019 Acute sinusitis, unspecified: Secondary | ICD-10-CM

## 2023-04-29 DIAGNOSIS — B9689 Other specified bacterial agents as the cause of diseases classified elsewhere: Secondary | ICD-10-CM

## 2023-04-29 MED ORDER — AMOXICILLIN-POT CLAVULANATE 875-125 MG PO TABS: 1.0000 | ORAL_TABLET | Freq: Two times a day (BID) | ORAL | 0 refills | Status: DC

## 2023-04-29 NOTE — Progress Notes (Signed)

## 2023-06-03 ENCOUNTER — Encounter: Payer: Self-pay | Admitting: Family

## 2023-06-03 ENCOUNTER — Ambulatory Visit (INDEPENDENT_AMBULATORY_CARE_PROVIDER_SITE_OTHER): Payer: BLUE CROSS/BLUE SHIELD | Admitting: Family

## 2023-06-03 ENCOUNTER — Ambulatory Visit: Payer: BLUE CROSS/BLUE SHIELD | Admitting: Family

## 2023-06-03 VITALS — BP 132/74 | HR 79 | Ht 62.0 in | Wt 122.8 lb

## 2023-06-03 DIAGNOSIS — R790 Abnormal level of blood mineral: Secondary | ICD-10-CM

## 2023-06-03 DIAGNOSIS — N926 Irregular menstruation, unspecified: Secondary | ICD-10-CM

## 2023-06-03 LAB — IBC + FERRITIN
Ferritin: 11.8 ng/mL (ref 10.0–291.0)
Iron: 34 ug/dL — ABNORMAL LOW (ref 42–145)
Saturation Ratios: 8.9 % — ABNORMAL LOW (ref 20.0–50.0)
TIBC: 380.8 ug/dL (ref 250.0–450.0)
Transferrin: 272 mg/dL (ref 212.0–360.0)

## 2023-06-03 NOTE — Progress Notes (Signed)
Gabriela Le is a 47 y.o. female with the following history as recorded in EpicCare:  Patient Active Problem List   Diagnosis Date Noted   Supraventricular tachycardia 03/03/2022   Complete tear of left rotator cuff 02/19/2022   Dyspnea on exertion 11/27/2021   Dyslipidemia 11/27/2021   Irregular menses 11/25/2021   Infertility associated with anovulation 11/25/2021   H/O cold sores 11/25/2021   Palpitations 11/17/2021   Impingement syndrome of left shoulder 02/27/2021   Epidermoid cyst 11/04/2020   Candidal vulvovaginitis 11/04/2020   Late prenatal care in third trimester 06/23/2017   Supervision of high risk pregnancy, antepartum 02/15/2017   Advanced maternal age in multigravida, unspecified trimester 02/15/2017   Previous cesarean delivery, antepartum condition or complication 10/06/2014   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    Allergies: Patient has no known allergies.  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    Past Surgical History:  Procedure Laterality Date   APPENDECTOMY     6 YOA   CESAREAN SECTION  01/06/2013   Procedure: CESAREAN SECTION;  Surgeon: Hal Morales, MD;  Location: [facility];  Service: Obstetrics;  Laterality: N/A;  Primary   CESAREAN SECTION N/A 10/09/2014   Procedure: CESAREAN SECTION;  Surgeon: Konrad Felix, MD;  Location: [facility];  Service: Obstetrics;  Laterality: N/A;    CESAREAN SECTION N/A 08/09/2017   Procedure: REPEAT CESAREAN SECTION;  Surgeon: Willodean Rosenthal, MD;  Location: [facility];  Service: Obstetrics;  Laterality: N/A;   CHOLECYSTECTOMY N/A 11/27/2014   Procedure: LAPAROSCOPIC CHOLECYSTECTOMY WITH INTRAOPERATIVE CHOLANGIOGRAM;  Surgeon: Chevis Pretty III, MD;  Location: [facility];  Service: General;  Laterality: N/A;   CYSTECTOMY     SHOULDER ARTHROSCOPY WITH ROTATOR CUFF REPAIR Left 02/19/2022   Procedure: LEFT SHOULDER ARTHROSCOPY WITH ROTATOR CUFF REPAIR;  Surgeon: [name], MD;  Location: [facility];  Service: Orthopedics;  Laterality: Left;   SHOULDER ARTHROSCOPY WITH SUBACROMIAL DECOMPRESSION Left 02/27/2021   Procedure: LEFT SHOULDER ARTHROSCOPY WITH DEBRIDEMENT AND SUBACROMIAL DECOMPRESSION;  Surgeon: [name], MD;  Location: [facility];  Service: Orthopedics;  Laterality: Left;    Family History  Problem Relation Age of Onset   Heart disease Mother        PALPITATIONS   Hypertension Mother     Social History   Tobacco Use   Smoking status: Never   Smokeless tobacco: Never  Substance Use Topics   Alcohol use: No    Subjective:   Accompanied by husband who helps provide history; complaining of feeling tired/ light-headed/ intermittent palpitations everyday from 9-10 and 3-4 pm; last set of labs at [facility] did show low ferritin; per husband, patient only taking daily MVI; did not understand to take oral slow FE as well;   LMP 01/2023- negative pregnancy test 2 days ago;   Objective:  Vitals:  06/03/23 1126  BP: 132/74  Pulse: 79  SpO2: 100%  Weight: 122 lb 12.8 oz (55.7 kg)  Height: 5\' 2"  (1.575 m)    General: Well developed, well nourished, in no acute distress  Skin : Warm and dry.  Head: Normocephalic and atraumatic  Eyes: Sclera and conjunctiva clear; pupils round and reactive to light; extraocular movements intact  Ears: External normal; canals clear;  tympanic membranes normal  Oropharynx: Pink, supple. No suspicious lesions  Neck: Supple without thyromegaly, adenopathy  Lungs: Respirations unlabored; clear to auscultation bilaterally without wheeze, rales, rhonchi  CVS exam: normal rate and regular rhythm.  Neurologic: Alert and oriented; speech intact; face symmetrical; moves all extremities well; CNII-XII intact without focal deficit   Assessment:  1. Low ferritin level   2. Irregular periods     Plan:  Suspect this is the source of her symptoms; will repeat labs; will plan to refer for iron infusions; follow up to be determined; Suspect peri-menopause; discussion about these symptoms/ normal for patient's age; check hcg today; follow up to be determined;  Time spent 30 minutes  No follow-ups on file.  Orders Placed This Encounter  Procedures   IBC + Ferritin   hCG, serum, qualitative    Requested Prescriptions    No prescriptions requested or ordered in this encounter

## 2023-06-04 ENCOUNTER — Other Ambulatory Visit: Payer: Self-pay | Admitting: Family

## 2023-06-04 DIAGNOSIS — R790 Abnormal level of blood mineral: Secondary | ICD-10-CM

## 2023-06-04 DIAGNOSIS — D649 Anemia, unspecified: Secondary | ICD-10-CM

## 2023-06-04 LAB — HCG, SERUM, QUALITATIVE: Preg, Serum: NEGATIVE

## 2023-06-11 ENCOUNTER — Other Ambulatory Visit: Payer: Self-pay | Admitting: Family

## 2023-06-11 DIAGNOSIS — D509 Iron deficiency anemia, unspecified: Secondary | ICD-10-CM

## 2023-06-14 ENCOUNTER — Inpatient Hospital Stay: Payer: BLUE CROSS/BLUE SHIELD | Attending: Hematology & Oncology

## 2023-06-14 ENCOUNTER — Inpatient Hospital Stay (HOSPITAL_BASED_OUTPATIENT_CLINIC_OR_DEPARTMENT_OTHER): Payer: BLUE CROSS/BLUE SHIELD | Admitting: Family

## 2023-06-14 ENCOUNTER — Encounter: Payer: Self-pay | Admitting: Family

## 2023-06-14 VITALS — BP 132/77 | HR 71 | Temp 98.3°F | Resp 18 | Ht 62.0 in | Wt 121.8 lb

## 2023-06-14 DIAGNOSIS — D509 Iron deficiency anemia, unspecified: Secondary | ICD-10-CM | POA: Diagnosis present

## 2023-06-14 LAB — CBC WITH DIFFERENTIAL (CANCER CENTER ONLY)
Abs Immature Granulocytes: 0.05 10*3/uL (ref 0.00–0.07)
Basophils Absolute: 0 10*3/uL (ref 0.0–0.1)
Basophils Relative: 0 %
Eosinophils Absolute: 0 10*3/uL (ref 0.0–0.5)
Eosinophils Relative: 1 %
HCT: 37.6 % (ref 36.0–46.0)
Hemoglobin: 12.2 g/dL (ref 12.0–15.0)
Immature Granulocytes: 1 %
Lymphocytes Relative: 43 %
Lymphs Abs: 3 10*3/uL (ref 0.7–4.0)
MCH: 30 pg (ref 26.0–34.0)
MCHC: 32.4 g/dL (ref 30.0–36.0)
MCV: 92.6 fL (ref 80.0–100.0)
Monocytes Absolute: 0.3 10*3/uL (ref 0.1–1.0)
Monocytes Relative: 4 %
Neutro Abs: 3.6 10*3/uL (ref 1.7–7.7)
Neutrophils Relative %: 51 %
Platelet Count: 222 10*3/uL (ref 150–400)
RBC: 4.06 MIL/uL (ref 3.87–5.11)
RDW: 14 % (ref 11.5–15.5)
WBC Count: 7 10*3/uL (ref 4.0–10.5)
nRBC: 0 % (ref 0.0–0.2)

## 2023-06-14 LAB — IRON AND IRON BINDING CAPACITY (CC-WL,HP ONLY)
Iron: 79 ug/dL (ref 28–170)
Saturation Ratios: 19 % (ref 10.4–31.8)
TIBC: 413 ug/dL (ref 250–450)
UIBC: 334 ug/dL (ref 148–442)

## 2023-06-14 LAB — RETICULOCYTES
Immature Retic Fract: 11.8 % (ref 2.3–15.9)
RBC.: 4.11 MIL/uL (ref 3.87–5.11)
Retic Count, Absolute: 81 10*3/uL (ref 19.0–186.0)
Retic Ct Pct: 2 % (ref 0.4–3.1)

## 2023-06-14 LAB — FERRITIN: Ferritin: 16 ng/mL (ref 11–307)

## 2023-06-14 NOTE — Progress Notes (Signed)
Hematology/Oncology Consultation   Name: Gabriela Le      MRN: [id number]    Location: Room/bed info not found  Date: 06/14/2023 Time:11:06 AM   REFERRING PHYSICIAN: Eustace Moore, FNP   REASON FOR CONSULT:  Symptomatic anemia, low ferritin level   DIAGNOSIS: Iron deficiency anemia  HISTORY OF PRESENT ILLNESS:  Gabriela Le is a pleasant 47 yo female with iron deficiency anemia.  Iron saturation is 8% and ferritin 11.  She is symptomatic with fatigue, dizziness, palpitations, SOB with exertion and hair thinning.  She states that her cycle is irregular occurring every 4 months or so. She states that her flow is regular. No other blood loss noted.  No abnormal bruising or petechiae.  She states that she has received IV iron in the past. She states that she needed after shoulder surgery a couple years ago and tolerated well.  No known familial history of anemia.  She has 3 children and no history of miscarriage.  She is sleeping well at night.  No history of diabetes or thyroid disease.  Mammogram in March was negative.  She states that she had a colonoscopy 16 years or so ago for GI issues that stemmed from eating spicy foods. Report unavailable in system.  No personal or known familial history of cancer.  No fever, chills, n/v, cough, rash, chest pain, abdominal pain or changes in bowel or bladder habits.  She state that she has daily diarrhea due to gall bladder issues.  No swelling or tenderness in her extremities.  She has numbness and tingling in the left hand that comes and goes since prior left shoulder surgery.  No falls or syncope reported.  Appetite and hydration are good. She does eat red meat. Weight is stable at 121 lbs.  No smoking, ETOH or recreational drug use.  She stays busy with her sweet family and also works for [employer]. Thankfully they have let her stay on light duty.   ROS: All other 10 point review of systems is negative.   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     PAST SURGICAL HISTORY Past Surgical History:  Procedure Laterality Date   APPENDECTOMY     6 YOA   CESAREAN SECTION  01/06/2013   Procedure: CESAREAN SECTION;  Surgeon: Hal Morales, MD;  Location: [facility];  Service: Obstetrics;  Laterality: N/A;  Primary   CESAREAN SECTION N/A 10/09/2014   Procedure: CESAREAN SECTION;  Surgeon: Konrad Felix, MD;  Location: [facility];  Service: Obstetrics;  Laterality: N/A;   CESAREAN SECTION N/A 08/09/2017   Procedure: REPEAT CESAREAN SECTION;  Surgeon: Willodean Rosenthal, MD;  Location: [facility];  Service: Obstetrics;  Laterality: N/A;   CHOLECYSTECTOMY N/A 11/27/2014   Procedure: LAPAROSCOPIC CHOLECYSTECTOMY WITH INTRAOPERATIVE CHOLANGIOGRAM;  Surgeon: Renae Fickle  Vernell Morgans, MD;  Location: [facility];  Service: General;  Laterality: N/A;   CYSTECTOMY     SHOULDER ARTHROSCOPY WITH ROTATOR CUFF REPAIR Left 02/19/2022   Procedure: LEFT SHOULDER ARTHROSCOPY WITH ROTATOR CUFF REPAIR;  Surgeon: [name], MD;  Location: [facility];  Service: Orthopedics;  Laterality: Left;   SHOULDER ARTHROSCOPY WITH SUBACROMIAL DECOMPRESSION Left 02/27/2021   Procedure: LEFT SHOULDER ARTHROSCOPY WITH DEBRIDEMENT AND SUBACROMIAL DECOMPRESSION;  Surgeon: [name], MD;  Location: [facility];   Service: Orthopedics;  Laterality: Left;    FAMILY HISTORY: Family History  Problem Relation Age of Onset   Heart disease Mother        PALPITATIONS   Hypertension Mother     SOCIAL HISTORY:  reports that she has never smoked. She has never used smokeless tobacco. She reports that she does not drink alcohol and does not use drugs.  PERFORMANCE STATUS: The patient's performance status is 1 - Symptomatic but completely ambulatory  PHYSICAL EXAM: Most Recent Vital Signs: Last menstrual period 02/12/2023. BP 132/77 (BP Location: Left Arm, Patient Position: Sitting)   Pulse 71   Temp 98.3 F (36.8 C) (Oral)   Resp 18   Ht 5\' 2"  (1.575 m)   Wt 121 lb 12.8 oz (55.2 kg)   LMP 02/12/2023   SpO2 100%   BMI 22.28 kg/m   General Appearance:    Alert, cooperative, no distress, appears stated age  Head:    Normocephalic, without obvious abnormality, atraumatic  Eyes:    PERRL, conjunctiva/corneas clear, EOM's intact, fundi    benign, both eyes        Throat:   Lips, mucosa, and tongue normal; teeth and gums normal  Neck:   Supple, symmetrical, trachea midline, no adenopathy;    thyroid:  no enlargement/tenderness/nodules; no carotid   bruit or JVD  Back:     Symmetric, no curvature, ROM normal, no CVA tenderness  Lungs:     Clear to auscultation bilaterally, respirations unlabored  Chest Wall:    No tenderness or deformity   Heart:    Regular rate and rhythm, S1 and S2 normal, no murmur, rub   or gallop     Abdomen:     Soft, non-tender, bowel sounds active all four quadrants,    no masses, no organomegaly        Extremities:   Extremities normal, atraumatic, no cyanosis or edema  Pulses:   2+ and symmetric all extremities  Skin:   Skin color, texture, turgor normal, no rashes or lesions  Lymph nodes:   Cervical, supraclavicular, and axillary nodes normal  Neurologic:   CNII-XII intact, normal strength, sensation and reflexes    throughout    LABORATORY DATA:  No results  found for this or any previous visit (from the past 48 hour(s)).    RADIOGRAPHY: No results found.     PATHOLOGY: None  ASSESSMENT/PLAN: Gabriela Le is a pleasant 47 yo female with iron deficiency anemia.  We will get her scheduled for 3 doses of IV iron.  Follow-up in 8 weeks.   All questions were answered. The patient knows to call the clinic with any problems, questions or concerns. We can certainly see the patient much sooner if necessary.  Eileen Stanford, NP

## 2023-06-22 ENCOUNTER — Telehealth: Payer: BLUE CROSS/BLUE SHIELD | Admitting: Physician Assistant

## 2023-06-22 ENCOUNTER — Inpatient Hospital Stay: Payer: BLUE CROSS/BLUE SHIELD

## 2023-06-22 VITALS — BP 129/84 | HR 65 | Temp 98.5°F | Resp 20

## 2023-06-22 DIAGNOSIS — D509 Iron deficiency anemia, unspecified: Secondary | ICD-10-CM

## 2023-06-22 DIAGNOSIS — R3989 Other symptoms and signs involving the genitourinary system: Secondary | ICD-10-CM

## 2023-06-22 MED ORDER — SODIUM CHLORIDE 0.9 % IV SOLN: Freq: Once | INTRAVENOUS | Status: AC

## 2023-06-22 MED ORDER — SODIUM CHLORIDE 0.9 % IV SOLN
300.0000 mg | Freq: Once | INTRAVENOUS | Status: AC
  Administered 2023-06-22: 300 mg via INTRAVENOUS
  Filled 2023-06-22: qty 300

## 2023-06-22 MED ORDER — CEPHALEXIN 500 MG PO CAPS: 500.0000 mg | ORAL_CAPSULE | Freq: Two times a day (BID) | ORAL | 0 refills | Status: AC

## 2023-06-22 NOTE — Progress Notes (Signed)
I have spent 5 minutes in review of e-visit questionnaire, review and updating patient chart, medical decision making and response to patient.   Gerilynn Mccullars Cody Jamiah Recore, PA-C    

## 2023-06-22 NOTE — Progress Notes (Signed)

## 2023-06-22 NOTE — Patient Instructions (Signed)

## 2023-06-29 ENCOUNTER — Inpatient Hospital Stay: Payer: BLUE CROSS/BLUE SHIELD | Attending: Hematology & Oncology

## 2023-06-29 VITALS — BP 128/72 | HR 67 | Temp 98.1°F | Resp 17 | Wt 124.0 lb

## 2023-06-29 DIAGNOSIS — D509 Iron deficiency anemia, unspecified: Secondary | ICD-10-CM | POA: Insufficient documentation

## 2023-06-29 MED ORDER — SODIUM CHLORIDE 0.9% FLUSH: 10.0000 mL | Freq: Once | INTRAVENOUS | Status: DC | PRN

## 2023-06-29 MED ORDER — SODIUM CHLORIDE 0.9 % IV SOLN
300.0000 mg | Freq: Once | INTRAVENOUS | Status: AC
  Administered 2023-06-29: 300 mg via INTRAVENOUS
  Filled 2023-06-29: qty 300

## 2023-06-29 MED ORDER — SODIUM CHLORIDE 0.9 % IV SOLN: Freq: Once | INTRAVENOUS | Status: AC

## 2023-06-29 MED ORDER — SODIUM CHLORIDE 0.9% FLUSH: 3.0000 mL | Freq: Once | INTRAVENOUS | Status: DC | PRN

## 2023-06-29 NOTE — Patient Instructions (Signed)
Iron Sucrose Injection ?? ??? ??????? ????? ????? ?????? ?????? ??????? ?????? (??? ???? ?????? ?? ??? ??????) ??? ??????? ???????? ?????? ?????. ??????? ?? ???? ???? ????? ????? ?? ????? ????? ???? ??????? ???? ???? ???????? ?? ????? ??? ???? ????? ?????. ???? ??????? ??? ?????? ?????? ????? ???? ???? ??????? ?????? ?? ??????? ??? ???? ???? ?????. ????? ???????? ???????? ????????:? Venofer ?? ?? ??????? ???? ??? ?? ???? ??? ???? ??????? ?????? ??? ????? ??? ???????  ???????? ??? ????? ?? ??? ??? ?????? ??? ?? ??? ???????:  ??? ???? ??? ?????? ?? ?????? ??????? ??????  ????? ?????  ??????? ?????? ?? ?????? ?? ????  ????? ?????  ????? ?????  ?? ??? ??? ???? ?? ?????? ???? ??????? ?? ????? ????? ?? ?????? ?? ?????? ?? ???? ?????  ????? ?? ?????? ????? ??????? ???????? ??? ??? ???? ??????? ??? ??????? ??? ?????? ???? ??????? ???? ????. ??? ?????? ?? ?????? ?? ?????. ???? ??? ???? ?????? ???? ??????? ??? ?????? ???????. ?? ??? ?? ??? ?????? ???? ?? ???? ??????? ????? ?????? ?? ????? 2 ????? ????? ????? ????? ?????? ??? ??? ???? ????? ?????????? ???????. ?????? ??????? : ??? ?????? ??? ?????? ???? ????? ???? ?? ??? ??????? ???? ????? ?????? ?? ?????? ?? ???? ??????? ?????. ??????: ?????? ??? ?????? ?? ???? ??? ???. ?? ????? ????? ??? ?? ????? ??? ??????. ???? ?? ???? (????) ????? ???? ??? ?????? ????? ????????. ?? ????? ??? ????? ?????. ???? ????? ?????? ??? ??? ??? ???? ??? ???????? ??? ??? ????????. ?? ?? ??????? ???? ?? ?????? ?? ??? ???????  ?? ?????? ??? ?????? ?? ?? ??? ???:  ????????????  ?????????? ?????? ?????? ??????  ??? ?????? ???? ?? ?????? ????? ?? ?? ???:  ????????????? ???????????? ??? ??????? ?? ?? ??? ?? ????????? ????????. ?? ?????? ???? ??????? ?????? ????? ??? ??????? ?? ??????? ?? ??????? ???? ???? ?? ???????? ???????? ???? ????????. ?????? ????? ??? ??? ???? ?? ???? ?????? ?? ?????? ?????? ??? ???????. ?? ?????? ??? ??????? ?? ?????. ?? ???? ??? ???? ????? ??? ???????  ??? ??????? ?? ?????? ???? ??????? ???????. ???? ???? ?????? ??? ?? ???? ?????? ?? ?????? ?? ??? ?????? ?????. ?? ????? ??? ????? ???????? ?? ????? ?????? ???? ??????. ?? ????? ??? ????? ???? ????? ???. ???? ??? ???? ???????. ????? ??????? ???? ????? ??? ?????? ???: ??????/?????? ???????? ???????? ???????? ???????? ???????? ?????????? ?????????? ??????? ???????? ??????? ??????? (????? ??????). ?? ?? ?????? ???????? ???? ???? ?? ??????? ??? ???? ??? ???????  ?????? ???????? ???? ??? ?? ???? ???? ?????? ??? ?? ???? ??? ????:  ??????? ????????--????? ??????? ?????? ??????????? (?????)? ???? ????? ?? ?????? ?? ?????? ?? ?????  ?????? ??? ????--??????? ?????? ???????? ?? ??????? ?????? ???????? ??? ??????  ?????? ???????? ???? ?? ????? ????? ????? ???? (???? ???? ?????? ??? ?????? ?? ???? ?????):  ?????? ?????  ??????  ??? ???????  ??? ???????  ??????? ???? ?? ??????? ?? ???? ?? ???? ????? ??? ??????? ?? ?? ??? ?? ?????? ???????? ????????. ???? ?????? ????????? ?????? ?? ?????? ????????. ????? ??????? ?? ?????? ???????? ?????? ??????? ???????? ????????? (FDA) ??? ????? ?.[phone]??? ??? ??? ???? ???????? ??????? ???? ??? ?????? ?? ?????? ?? ?????? ??? ???? ?? ??????. ??????: ??? ??????? ????? ?? ????: ?? ?? ???? ??? ???????? ?? ????????? ???????. ??? ???? ???? ?? ????? ?? ??? ??????? ????? ??? ?????? ?? ??????? ?? ???? ??????? ??????.   2024 Elsevier/Gold Standard (2022-11-04 00:00:00)

## 2023-07-06 ENCOUNTER — Inpatient Hospital Stay: Payer: BLUE CROSS/BLUE SHIELD

## 2023-07-06 VITALS — BP 119/73 | HR 73 | Temp 98.3°F | Resp 18

## 2023-07-06 DIAGNOSIS — D509 Iron deficiency anemia, unspecified: Secondary | ICD-10-CM | POA: Diagnosis not present

## 2023-07-06 MED ORDER — SODIUM CHLORIDE 0.9 % IV SOLN
300.0000 mg | Freq: Once | INTRAVENOUS | Status: AC
  Administered 2023-07-06: 300 mg via INTRAVENOUS
  Filled 2023-07-06: qty 300

## 2023-07-06 MED ORDER — SODIUM CHLORIDE 0.9 % IV SOLN: Freq: Once | INTRAVENOUS | Status: AC

## 2023-07-06 NOTE — Patient Instructions (Signed)

## 2023-08-04 ENCOUNTER — Telehealth: Payer: BLUE CROSS/BLUE SHIELD | Admitting: Nurse Practitioner

## 2023-08-04 DIAGNOSIS — K1370 Unspecified lesions of oral mucosa: Secondary | ICD-10-CM | POA: Diagnosis not present

## 2023-08-04 MED ORDER — ACYCLOVIR 800 MG PO TABS
800.0000 mg | ORAL_TABLET | Freq: Two times a day (BID) | ORAL | 0 refills | Status: AC
Start: 2023-08-04 — End: 2023-08-11

## 2023-08-04 NOTE — Progress Notes (Signed)
We are sorry that you are not feeling well.  Here is how we plan to help!  Based on what you have shared with me it does look like you have a viral infection.    Most cold sores or fever blisters are small fluid filled blisters around the mouth caused by herpes simplex virus.  The most common strain of the virus causing cold sores is herpes simplex virus 1.  It can be spread by skin contact, sharing eating utensils, or even sharing towels.  Cold sores are contagious to other people until dry. (Approximately 5-7 days).  Wash your hands. You can spread the virus to your eyes through handling your contact lenses after touching the lesions.  Most people experience pain at the sight or tingling sensations in their lips that may begin before the ulcers erupt.  Herpes simplex is treatable but not curable.  It may lie dormant for a long time and then reappear due to stress or prolonged sun exposure.  Many patients have success in treating their cold sores with an over the counter topical called Abreva.  You may apply the cream up to 5 times daily (maximum 10 days) until healing occurs.  If you would like to use an oral antiviral medication to speed the healing of your cold sore, I have sent a prescription to your local pharmacy Acyclovir 800 mg take one by mouth twice a day for 7 days    HOME CARE:  Wash your hands frequently. Do not pick at or rub the sore. Don't open the blisters. Avoid kissing other people during this time. Avoid sharing drinking glasses, eating utensils, or razors. Do not handle contact lenses unless you have thoroughly washed your hands with soap and warm water! Avoid oral sex during this time.  Herpes from sores on your mouth can spread to your partner's genital area. Avoid contact with anyone who has eczema or a weakened immune system. Cold sores are often triggered by exposure to intense sunlight, use a lip balm containing a sunscreen (SPF 30 or higher).  GET HELP RIGHT AWAY  IF:  Blisters look infected. Blisters occur near or in the eye. Symptoms last longer than 10 days. Your symptoms become worse.  MAKE SURE YOU:  Understand these instructions. Will watch your condition. Will get help right away if you are not doing well or get worse.    Your e-visit answers were reviewed by a board certified advanced clinical practitioner to complete your personal care plan.  Depending upon the condition, your plan could have  Included both over the counter or prescription medications.    Please review your pharmacy choice.  Be sure that the pharmacy you have chosen is open so that you can pick up your prescription now.  If there is a problem you can message your provider in MyChart to have the prescription routed to another pharmacy.    Your safety is important to us.  If you have drug allergies check our prescription carefully.  For the next 24 hours you can use MyChart to ask questions about today's visit, request a non-urgent call back, or ask for a work or school excuse from your e-visit provider.  You will get an email in the next two days asking about your experience.  I hope that your e-visit has been valuable and will speed your recovery.   Meds ordered this encounter  Medications   acyclovir (ZOVIRAX) 800 MG tablet    Sig: Take 1 tablet (800 mg   total) by mouth 2 (two) times daily for 7 days.    Dispense:  14 tablet    Refill:  0    I spent approximately 5 minutes reviewing the patient's history, current symptoms and coordinating their care today.   

## 2023-08-09 ENCOUNTER — Other Ambulatory Visit: Payer: Self-pay

## 2023-08-09 ENCOUNTER — Inpatient Hospital Stay (HOSPITAL_BASED_OUTPATIENT_CLINIC_OR_DEPARTMENT_OTHER): Payer: BLUE CROSS/BLUE SHIELD | Admitting: Family

## 2023-08-09 ENCOUNTER — Inpatient Hospital Stay: Payer: BLUE CROSS/BLUE SHIELD | Attending: Hematology & Oncology

## 2023-08-09 ENCOUNTER — Encounter: Payer: Self-pay | Admitting: Family

## 2023-08-09 VITALS — BP 134/78 | HR 68 | Temp 98.1°F | Resp 18 | Ht 62.0 in | Wt 124.1 lb

## 2023-08-09 DIAGNOSIS — D509 Iron deficiency anemia, unspecified: Secondary | ICD-10-CM

## 2023-08-09 LAB — CBC WITH DIFFERENTIAL (CANCER CENTER ONLY)
Abs Immature Granulocytes: 0.08 10*3/uL — ABNORMAL HIGH (ref 0.00–0.07)
Basophils Absolute: 0 10*3/uL (ref 0.0–0.1)
Basophils Relative: 0 %
Eosinophils Absolute: 0.1 10*3/uL (ref 0.0–0.5)
Eosinophils Relative: 1 %
HCT: 40.4 % (ref 36.0–46.0)
Hemoglobin: 13.3 g/dL (ref 12.0–15.0)
Immature Granulocytes: 1 %
Lymphocytes Relative: 35 %
Lymphs Abs: 2.9 10*3/uL (ref 0.7–4.0)
MCH: 31.3 pg (ref 26.0–34.0)
MCHC: 32.9 g/dL (ref 30.0–36.0)
MCV: 95.1 fL (ref 80.0–100.0)
Monocytes Absolute: 0.4 10*3/uL (ref 0.1–1.0)
Monocytes Relative: 5 %
Neutro Abs: 4.7 10*3/uL (ref 1.7–7.7)
Neutrophils Relative %: 58 %
Platelet Count: 192 10*3/uL (ref 150–400)
RBC: 4.25 MIL/uL (ref 3.87–5.11)
RDW: 13.5 % (ref 11.5–15.5)
WBC Count: 8.1 10*3/uL (ref 4.0–10.5)
nRBC: 0 % (ref 0.0–0.2)

## 2023-08-09 LAB — FERRITIN: Ferritin: 255 ng/mL (ref 11–307)

## 2023-08-09 LAB — RETICULOCYTES
Immature Retic Fract: 11.1 % (ref 2.3–15.9)
RBC.: 4.24 MIL/uL (ref 3.87–5.11)
Retic Count, Absolute: 85.6 10*3/uL (ref 19.0–186.0)
Retic Ct Pct: 2 % (ref 0.4–3.1)

## 2023-08-09 LAB — IRON AND IRON BINDING CAPACITY (CC-WL,HP ONLY)
Iron: 105 ug/dL (ref 28–170)
Saturation Ratios: 34 % — ABNORMAL HIGH (ref 10.4–31.8)
TIBC: 312 ug/dL (ref 250–450)
UIBC: 207 ug/dL (ref 148–442)

## 2023-08-09 NOTE — Progress Notes (Signed)
Hematology and Oncology Follow Up Visit  Gabriela Le [id number] 07-Jul-1976 47 y.o. 08/09/2023   Principle Diagnosis:  Iron deficiency anemia   Current Therapy:   IV iron as indicated   Interim History:  Gabriela Le is here today with her husband for follow-up. She is doing well but still notes some fatigue and afternoon palpitation between 5-6 pm.  She states that her cycle is regular with heavy flow. She has occasional clots.  No other blood loss noted. No bruising or petechiae.  No fever, chills, n/v, cough, rash, dizziness, SOB, chest pain, abdominal pain or changes in bowel or bladder habits.  No swelling, tenderness, numbness or tingling in her extremities.  No falls or syncope reported.  Appetite and hydration are good. Weight is stable at 124 lbs.   ECOG Performance Status: 1 - Symptomatic but completely ambulatory  Medications:  Allergies as of 08/09/2023   No Known Allergies      Medication List        Accurate as of August 09, 2023  1:21 PM. If you have any questions, ask your nurse or doctor.          acyclovir 800 MG tablet Commonly known as: ZOVIRAX Take 1 tablet (800 mg total) by mouth 2 (two) times daily for 7 days.   cetirizine 10 MG tablet Commonly known as: ZYRTEC Take 10 mg by mouth daily.   cholecalciferol 25 MCG (1000 UNIT) tablet Commonly known as: VITAMIN D3 Take 1,000 Units by mouth daily.   tiZANidine 4 MG tablet Commonly known as: ZANAFLEX TAKE 1 TABLET(4 MG) BY MOUTH EVERY 8 HOURS AS NEEDED FOR MUSCLE SPASMS   WOMENS MULTIVITAMIN PO Take by mouth daily at 6 (six) AM.        Allergies: No Known Allergies  Past Medical History, Surgical history, Social history, and Family History were reviewed and updated.  Review of Systems: All other 10 point review of systems is negative.   Physical Exam:  vitals were not taken for this visit.   Wt Readings from Last 3 Encounters:  06/29/23 124 lb 0.6 oz (56.3 kg)  06/14/23 121 lb  12.8 oz (55.2 kg)  06/03/23 122 lb 12.8 oz (55.7 kg)    Ocular: Sclerae unicteric, pupils equal, round and reactive to light Ear-nose-throat: Oropharynx clear, dentition fair Lymphatic: No cervical or supraclavicular adenopathy Lungs no rales or rhonchi, good excursion bilaterally Heart regular rate and rhythm, no murmur appreciated Abd soft, nontender, positive bowel sounds MSK no focal spinal tenderness, no joint edema Neuro: non-focal, well-oriented, appropriate affect Breasts: Deferred   Lab Results  Component Value Date   WBC 8.1 08/09/2023   HGB 13.3 08/09/2023   HCT 40.4 08/09/2023   MCV 95.1 08/09/2023   PLT 192 08/09/2023   Lab Results  Component Value Date   FERRITIN 16 06/14/2023   IRON 79 06/14/2023   TIBC 413 06/14/2023   UIBC 334 06/14/2023   IRONPCTSAT 19 06/14/2023   Lab Results  Component Value Date   RETICCTPCT 2.0 08/09/2023   RBC 4.25 08/09/2023   RBC 4.24 08/09/2023   No results found for: "KPAFRELGTCHN", "LAMBDASER", "KAPLAMBRATIO" No results found for: "IGGSERUM", "IGA", "IGMSERUM" No results found for: "TOTALPROTELP", "ALBUMINELP", "A1GS", "A2GS", "BETS", "BETA2SER", "GAMS", "MSPIKE", "SPEI"   Chemistry      Component Value Date/Time   NA 142 03/03/2023 0950   K 4.4 03/03/2023 0950   CL 105 03/03/2023 0950   CO2 30 03/03/2023 0950   BUN 15 03/03/2023 0950  CREATININE 0.53 03/03/2023 0950      Component Value Date/Time   CALCIUM 8.9 03/03/2023 0950   ALKPHOS 62 03/03/2023 0950   AST 10 03/03/2023 0950   ALT 7 03/03/2023 0950   BILITOT 0.3 03/03/2023 0950       Impression and Plan: Gabriela Le is a pleasant 47 yo female with iron deficiency anemia.  Iron studies are pending. We will replace again if needed.  Follow-up in 3 months.   Eileen Stanford, NP 8/12/20241:21 PM

## 2023-08-19 ENCOUNTER — Other Ambulatory Visit: Payer: Self-pay | Admitting: Family

## 2023-09-30 ENCOUNTER — Other Ambulatory Visit: Payer: Self-pay | Admitting: Family

## 2024-02-12 ENCOUNTER — Other Ambulatory Visit: Payer: Self-pay | Admitting: Family

## 2024-05-29 ENCOUNTER — Other Ambulatory Visit: Payer: Self-pay | Admitting: Family

## 2024-05-30 ENCOUNTER — Encounter: Payer: Self-pay | Admitting: Family

## 2024-05-30 ENCOUNTER — Ambulatory Visit: Admitting: Family

## 2024-05-30 VITALS — BP 132/84 | HR 74 | Ht 62.0 in | Wt 121.6 lb

## 2024-05-30 DIAGNOSIS — R002 Palpitations: Secondary | ICD-10-CM | POA: Diagnosis not present

## 2024-05-30 DIAGNOSIS — D509 Iron deficiency anemia, unspecified: Secondary | ICD-10-CM | POA: Diagnosis not present

## 2024-05-30 DIAGNOSIS — B009 Herpesviral infection, unspecified: Secondary | ICD-10-CM | POA: Diagnosis not present

## 2024-05-30 DIAGNOSIS — J309 Allergic rhinitis, unspecified: Secondary | ICD-10-CM

## 2024-05-30 LAB — COMPREHENSIVE METABOLIC PANEL WITH GFR
ALT: 10 U/L (ref 0–35)
AST: 14 U/L (ref 0–37)
Albumin: 4.3 g/dL (ref 3.5–5.2)
Alkaline Phosphatase: 71 U/L (ref 39–117)
BUN: 12 mg/dL (ref 6–23)
CO2: 33 meq/L — ABNORMAL HIGH (ref 19–32)
Calcium: 9.4 mg/dL (ref 8.4–10.5)
Chloride: 102 meq/L (ref 96–112)
Creatinine, Ser: 0.49 mg/dL (ref 0.40–1.20)
GFR: 111.45 mL/min (ref >60.00)
Glucose, Bld: 103 mg/dL — ABNORMAL HIGH (ref 70–99)
Potassium: 4.3 meq/L (ref 3.5–5.1)
Sodium: 141 meq/L (ref 135–145)
Total Bilirubin: 0.3 mg/dL (ref 0.2–1.2)
Total Protein: 7.1 g/dL (ref 6.0–8.3)

## 2024-05-30 LAB — CBC WITH DIFFERENTIAL/PLATELET
Basophils Absolute: 0 10*3/uL (ref 0.0–0.1)
Basophils Relative: 0.3 % (ref 0.0–3.0)
Eosinophils Absolute: 0.1 10*3/uL (ref 0.0–0.7)
Eosinophils Relative: 0.9 % (ref 0.0–5.0)
HCT: 42.1 % (ref 36.0–46.0)
Hemoglobin: 13.8 g/dL (ref 12.0–15.0)
Lymphocytes Relative: 35.1 % (ref 12.0–46.0)
Lymphs Abs: 2.7 10*3/uL (ref 0.7–4.0)
MCHC: 32.9 g/dL (ref 30.0–36.0)
MCV: 92.9 fl (ref 78.0–100.0)
Monocytes Absolute: 0.4 10*3/uL (ref 0.1–1.0)
Monocytes Relative: 5.2 % (ref 3.0–12.0)
Neutro Abs: 4.5 10*3/uL (ref 1.4–7.7)
Neutrophils Relative %: 58.5 % (ref 43.0–77.0)
Platelets: 229 10*3/uL (ref 150.0–400.0)
RBC: 4.53 Mil/uL (ref 3.87–5.11)
RDW: 13.6 % (ref 11.5–15.5)
WBC: 7.7 10*3/uL (ref 4.0–10.5)

## 2024-05-30 LAB — IBC + FERRITIN
Ferritin: 180.2 ng/mL (ref 10.0–291.0)
Iron: 96 ug/dL (ref 42–145)
Saturation Ratios: 29.7 % (ref 20.0–50.0)
TIBC: 323.4 ug/dL (ref 250.0–450.0)
Transferrin: 231 mg/dL (ref 212.0–360.0)

## 2024-05-30 LAB — TSH: TSH: 1.51 u[IU]/mL (ref 0.35–5.50)

## 2024-05-30 MED ORDER — VALACYCLOVIR HCL 500 MG PO TABS: 500.0000 mg | ORAL_TABLET | Freq: Every day | ORAL | 1 refills | Status: DC

## 2024-05-30 MED ORDER — DOXYCYCLINE HYCLATE 100 MG PO TABS: 100.0000 mg | ORAL_TABLET | Freq: Two times a day (BID) | ORAL | 0 refills | Status: AC

## 2024-05-30 MED ORDER — TIZANIDINE HCL 4 MG PO TABS: ORAL_TABLET | ORAL | 1 refills | Status: DC

## 2024-05-30 MED ORDER — CETIRIZINE HCL 10 MG PO TABS: 10.0000 mg | ORAL_TABLET | Freq: Every day | ORAL | 11 refills | Status: DC

## 2024-05-30 NOTE — Progress Notes (Signed)
 Gabriela Le is a 48 y.o. female with the following history as recorded in EpicCare:  Patient Active Problem List   Diagnosis Date Noted   IDA (iron  deficiency anemia) 06/14/2023   Supraventricular tachycardia (HCC) 03/03/2022   Complete tear of left rotator cuff 02/19/2022   Dyspnea on exertion 11/27/2021   Dyslipidemia 11/27/2021   Irregular menses 11/25/2021   Infertility associated with anovulation 11/25/2021   H/O cold sores 11/25/2021   Palpitations 11/17/2021   Impingement syndrome of left shoulder 02/27/2021   Epidermoid cyst 11/04/2020   Candidal vulvovaginitis 11/04/2020   Late prenatal care in third trimester 06/23/2017   Supervision of high risk pregnancy, antepartum 02/15/2017   Advanced maternal age in multigravida, unspecified trimester 02/15/2017   Previous cesarean delivery, antepartum condition or complication 10/06/2014   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     doxycycline (VIBRA-TABS) 100 MG tablet Take 1 tablet (100 mg total) by mouth 2 (two) times daily. 14 tablet 0   Multiple Vitamins-Minerals (WOMENS MULTIVITAMIN PO) Take by mouth daily at 6 (six) AM.     valACYclovir  (VALTREX ) 500 MG tablet Take 1 tablet (500 mg total) by mouth daily. 90 tablet 1   cetirizine  (ZYRTEC ) 10 MG tablet Take 1 tablet (10 mg total) by mouth daily. 30 tablet 11   tiZANidine  (ZANAFLEX ) 4 MG tablet TAKE 1 TABLET(4 MG) BY MOUTH EVERY 8 HOURS AS NEEDED FOR MUSCLE SPASMS 30 tablet 1   No current facility-administered medications for this visit.    Allergies: Patient has no known allergies.  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    Past Surgical History:  Procedure Laterality Date   APPENDECTOMY     6 YOA   CESAREAN SECTION  01/06/2013   Procedure: CESAREAN SECTION;  Surgeon: Stevenson Elbe, MD;  Location: [facility];  Service: Obstetrics;  Laterality: N/A;  Primary   CESAREAN SECTION N/A 10/09/2014   Procedure: CESAREAN SECTION;  Surgeon: Rawland Caddy, MD;  Location: [facility];  Service: Obstetrics;  Laterality: N/A;   CESAREAN SECTION N/A 08/09/2017   Procedure: REPEAT CESAREAN SECTION;  Surgeon: Lenord Radon, MD;  Location: [facility];  Service: Obstetrics;  Laterality: N/A;   CHOLECYSTECTOMY N/A 11/27/2014   Procedure: LAPAROSCOPIC CHOLECYSTECTOMY WITH INTRAOPERATIVE CHOLANGIOGRAM;  Surgeon: Lillette Reid III, MD;  Location: [facility];  Service: General;  Laterality: N/A;   CYSTECTOMY     SHOULDER ARTHROSCOPY WITH ROTATOR CUFF REPAIR Left 02/19/2022   Procedure: LEFT SHOULDER ARTHROSCOPY WITH ROTATOR CUFF REPAIR;  Surgeon: Arnie Lao, MD;  Location: [facility];  Service: Orthopedics;  Laterality: Left;   SHOULDER ARTHROSCOPY WITH SUBACROMIAL DECOMPRESSION Left 02/27/2021   Procedure: LEFT SHOULDER ARTHROSCOPY WITH DEBRIDEMENT AND SUBACROMIAL DECOMPRESSION;  Surgeon: Arnie Lao, MD;  Location: [facility];  Service: Orthopedics;  Laterality: Left;    Family History  Problem Relation Age of Onset   Heart disease Mother  PALPITATIONS   Hypertension Mother     Social History   Tobacco Use   Smoking status: Never   Smokeless tobacco: Never  Substance Use Topics   Alcohol use: No    Subjective:   Concerned about cold sore inside nose- symptoms flared end of May; is concerned that is having increased episodes/ problems with cold sore; has done well on Valtrex  in the past;   Also wants to get iron  levels  updated today; will be leaving for 2 months trip out of the country at the end of the week but does plan to follow up with her hematologist when she returns.   Objective:  Vitals:   05/30/24 1131  BP: 132/84  Pulse: 74  SpO2: 100%  Weight: 121 lb 9.6 oz (55.2 kg)  Height: 5\' 2"  (1.575 m)    General: Well developed, well nourished, in no acute distress  Skin : Warm and dry.  Head: Normocephalic and atraumatic  Eyes: Sclera and conjunctiva clear; pupils round and reactive to light; extraocular movements intact  Ears: External normal; canals clear; tympanic membranes normal  Oropharynx: Pink, supple. No suspicious lesions  Neck: Supple without thyromegaly, adenopathy  Lungs: Respirations unlabored;  Neurologic: Alert and oriented; speech intact; face symmetrical; moves all extremities well; CNII-XII intact without focal deficit   Assessment:  1. Iron  deficiency anemia, unspecified iron  deficiency anemia type   2. 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 Allergic rhinitis, unspecified seasonality, unspecified trigger     Plan:  & 2. Check CBC, CMP, TSH, iron  panel; patient plans to see her hematologist when she returns in August;  3.   Will treat for secondary infection with Doxycycline 100 mg bid x 7 days; start Valtrex  500 mg daily for suppression- will try treating x 6 months;   No follow-ups on file.  Orders Placed This Encounter  Procedures   CBC with Differential/Platelet   IBC + Ferritin   Comp Met (CMET)   TSH    Requested Prescriptions   Signed Prescriptions Disp Refills   cetirizine  (ZYRTEC ) 10 MG tablet 30 tablet 11    Sig: Take 1 tablet (10 mg total) by mouth daily.   tiZANidine  (ZANAFLEX ) 4 MG tablet 30 tablet 1    Sig: TAKE 1 TABLET(4 MG) BY MOUTH EVERY 8 HOURS AS NEEDED FOR MUSCLE SPASMS   doxycycline (VIBRA-TABS) 100 MG tablet 14 tablet 0    Sig: Take 1 tablet (100 mg total) by mouth 2 (two) times daily.   valACYclovir  (VALTREX ) 500  MG tablet 90 tablet 1    Sig: Take 1 tablet (500 mg total) by mouth daily.

## 2024-05-31 ENCOUNTER — Ambulatory Visit: Payer: Self-pay | Admitting: Family

## 2024-06-09 ENCOUNTER — Encounter: Payer: Self-pay | Admitting: Family

## 2024-10-25 ENCOUNTER — Encounter: Payer: Self-pay | Admitting: Family

## 2024-10-26 ENCOUNTER — Encounter: Payer: Self-pay | Admitting: Family Medicine

## 2024-10-26 ENCOUNTER — Ambulatory Visit (INDEPENDENT_AMBULATORY_CARE_PROVIDER_SITE_OTHER): Admitting: Family Medicine

## 2024-10-26 VITALS — BP 124/76 | HR 82 | Temp 98.0°F | Resp 18 | Ht 62.0 in | Wt 126.0 lb

## 2024-10-26 DIAGNOSIS — M7542 Impingement syndrome of left shoulder: Secondary | ICD-10-CM

## 2024-10-26 DIAGNOSIS — E785 Hyperlipidemia, unspecified: Secondary | ICD-10-CM | POA: Diagnosis not present

## 2024-10-26 DIAGNOSIS — Z8619 Personal history of other infectious and parasitic diseases: Secondary | ICD-10-CM | POA: Diagnosis not present

## 2024-10-26 DIAGNOSIS — R002 Palpitations: Secondary | ICD-10-CM

## 2024-10-26 DIAGNOSIS — J309 Allergic rhinitis, unspecified: Secondary | ICD-10-CM

## 2024-10-26 LAB — LIPID PANEL
Cholesterol: 226 mg/dL — ABNORMAL HIGH (ref 0–200)
HDL: 48.4 mg/dL (ref >39.00)
LDL Cholesterol: 117 mg/dL — ABNORMAL HIGH (ref 0–99)
NonHDL: 177.61
Total CHOL/HDL Ratio: 5
Triglycerides: 305 mg/dL — ABNORMAL HIGH (ref 0.0–149.0)
VLDL: 61 mg/dL — ABNORMAL HIGH (ref 0.0–40.0)

## 2024-10-26 LAB — COMPREHENSIVE METABOLIC PANEL WITH GFR
ALT: 11 U/L (ref 0–35)
AST: 12 U/L (ref 0–37)
Albumin: 4.2 g/dL (ref 3.5–5.2)
Alkaline Phosphatase: 78 U/L (ref 39–117)
BUN: 12 mg/dL (ref 6–23)
CO2: 33 meq/L — ABNORMAL HIGH (ref 19–32)
Calcium: 9.3 mg/dL (ref 8.4–10.5)
Chloride: 101 meq/L (ref 96–112)
Creatinine, Ser: 0.54 mg/dL (ref 0.40–1.20)
GFR: 108.56 mL/min (ref >60.00)
Glucose, Bld: 98 mg/dL (ref 70–99)
Potassium: 3.9 meq/L (ref 3.5–5.1)
Sodium: 141 meq/L (ref 135–145)
Total Bilirubin: 0.3 mg/dL (ref 0.2–1.2)
Total Protein: 6.8 g/dL (ref 6.0–8.3)

## 2024-10-26 LAB — CBC WITH DIFFERENTIAL/PLATELET
Basophils Absolute: 0 K/uL (ref 0.0–0.1)
Basophils Relative: 0.3 % (ref 0.0–3.0)
Eosinophils Absolute: 0.1 K/uL (ref 0.0–0.7)
Eosinophils Relative: 0.9 % (ref 0.0–5.0)
HCT: 39.4 % (ref 36.0–46.0)
Hemoglobin: 13.1 g/dL (ref 12.0–15.0)
Lymphocytes Relative: 42.1 % (ref 12.0–46.0)
Lymphs Abs: 3 K/uL (ref 0.7–4.0)
MCHC: 33.4 g/dL (ref 30.0–36.0)
MCV: 92.9 fl (ref 78.0–100.0)
Monocytes Absolute: 0.4 K/uL (ref 0.1–1.0)
Monocytes Relative: 5.7 % (ref 3.0–12.0)
Neutro Abs: 3.6 K/uL (ref 1.4–7.7)
Neutrophils Relative %: 51 % (ref 43.0–77.0)
Platelets: 207 K/uL (ref 150.0–400.0)
RBC: 4.24 Mil/uL (ref 3.87–5.11)
RDW: 13.4 % (ref 11.5–15.5)
WBC: 7.1 K/uL (ref 4.0–10.5)

## 2024-10-26 LAB — TSH: TSH: 1.59 u[IU]/mL (ref 0.35–5.50)

## 2024-10-26 MED ORDER — VALACYCLOVIR HCL 500 MG PO TABS: 500.0000 mg | ORAL_TABLET | Freq: Every day | ORAL | 1 refills | Status: AC

## 2024-10-26 MED ORDER — TIZANIDINE HCL 4 MG PO TABS: ORAL_TABLET | ORAL | 1 refills | Status: AC

## 2024-10-26 MED ORDER — CETIRIZINE HCL 10 MG PO TABS: 10.0000 mg | ORAL_TABLET | Freq: Every day | ORAL | 11 refills | Status: AC

## 2024-10-26 NOTE — Progress Notes (Signed)
 Acute Office Visit  Subjective:  Patient ID: Gabriela Le, female    DOB: June 27, 1976  Age: 48 y.o. MRN: [id number]  CC:  Chief Complaint  Patient presents with   Palpitations      HPI Gabriela Le is here for Palpitations. She is here with her husband.    Discussed the use of AI scribe software for clinical note transcription with the patient, who gave verbal consent to proceed.  History of Present Illness Gabriela Le is a 48 year old female who presents with palpitations and irregular menstruation.  She experiences palpitations that is sometimes accompanied by dizziness and shortness of breath, particularly if it occurs when lying down at night. These symptoms occur daily and can last from one hour to less than a day. Her palpitations improved previously with iron  infusions. Her last heart monitor and an echocardiogram two years ago were reported to her as normal other than one 8 beat run of SVT. She was told that treatment was not necessary unless symptoms became more bothersome.  She has a history of iron  deficiency anemia and has received iron  infusions in the past, which improved her symptoms. Her thyroid  function was checked in June and was normal.  She consumes one cup of caffeinated tea daily and denies excessive caffeine intake, which previously exacerbated her palpitations. She does not consume alcohol. 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    Past Surgical History:  Procedure Laterality Date   APPENDECTOMY     6 YOA   CESAREAN SECTION  01/06/2013   Procedure: CESAREAN SECTION;  Surgeon: Shanda SHAUNNA Muscat, MD;  Location: [facility];  Service:  Obstetrics;  Laterality: N/A;  Primary   CESAREAN SECTION N/A 10/09/2014   Procedure: CESAREAN SECTION;  Surgeon: Jerolyn Cedric Foil, MD;  Location: [facility];  Service: Obstetrics;  Laterality: N/A;   CESAREAN SECTION N/A 08/09/2017   Procedure: REPEAT CESAREAN SECTION;  Surgeon: Corene Coy, MD;  Location: [facility];  Service: Obstetrics;  Laterality: N/A;   CHOLECYSTECTOMY N/A 11/27/2014   Procedure: LAPAROSCOPIC CHOLECYSTECTOMY WITH INTRAOPERATIVE CHOLANGIOGRAM;  Surgeon: Deward Null III, MD;  Location: [facility];  Service: General;  Laterality: N/A;   CYSTECTOMY     SHOULDER ARTHROSCOPY WITH ROTATOR CUFF REPAIR Left 02/19/2022   Procedure: LEFT SHOULDER ARTHROSCOPY WITH ROTATOR CUFF REPAIR;  Surgeon: Vernetta Lonni GRADE, MD;  Location: [facility];  Service: Orthopedics;  Laterality: Left;   SHOULDER ARTHROSCOPY WITH SUBACROMIAL DECOMPRESSION Left 02/27/2021   Procedure: LEFT SHOULDER ARTHROSCOPY WITH DEBRIDEMENT AND SUBACROMIAL DECOMPRESSION;  Surgeon: Vernetta Lonni GRADE, MD;  Location: [facility];  Service: Orthopedics;  Laterality: Left;    Family History  Problem Relation Age of Onset   Heart disease Mother        PALPITATIONS   Hypertension Mother     Social History   Socioeconomic History   Marital status: Married    Spouse name: MARCEY EAGLE   Number of children: 0   Years of education: 14   Highest education level: Not on file  Occupational History  Occupation: STUDENT    Comment: [employer]  Tobacco Use   Smoking status: Never   Smokeless tobacco: Never  Vaping Use   Vaping status: Never Used  Substance and Sexual Activity   Alcohol use: No   Drug use: No   Sexual activity: Yes    Partners: Male    Birth control/protection: None  Other Topics Concern   Not on file  Social History Narrative   Not on file   Social Drivers of Health   Financial Resource Strain: Not on file  Food Insecurity: No Food Insecurity  (06/14/2023)   Hunger Vital Sign    Worried About Running Out of Food in the Last Year: Never true    Ran Out of Food in the Last Year: Never true  Transportation Needs: No Transportation Needs (06/14/2023)   PRAPARE - Administrator, Civil Service (Medical): No    Lack of Transportation (Non-Medical): No  Physical Activity: Not on file  Stress: Not on file  Social Connections: Not on file  Intimate Partner Violence: Not At Risk (06/14/2023)   Humiliation, Afraid, Rape, and Kick questionnaire    Fear of Current or Ex-Partner: No    Emotionally Abused: No    Physically Abused: No    Sexually Abused: No    ROS All ROS negative except what is listed in the HPI.   Objective:   Today's Vitals: BP 124/76   Pulse 82   Temp 98 F (36.7 C)   Resp 18   Ht 5' 2 (1.575 m)   Wt 126 lb (57.2 kg)   LMP 08/02/2024   SpO2 98%   BMI 23.05 kg/m   Physical Exam Vitals reviewed.  Constitutional:      Appearance: Normal appearance.  Cardiovascular:     Rate and Rhythm: Normal rate and regular rhythm.     Heart sounds: Normal heart sounds.  Pulmonary:     Effort: Pulmonary effort is normal.     Breath sounds: Normal breath sounds.  Skin:    General: Skin is warm and dry.  Neurological:     Mental Status: She is alert and oriented to person, place, and time.  Psychiatric:        Mood and Affect: Mood normal.        Behavior: Behavior normal.        Thought Content: Thought content normal.        Judgment: Judgment normal.         Assessment & Plan:   Problem List Items Addressed This Visit       Active Problems   Impingement syndrome of left shoulder   Relevant Medications   tiZANidine  (ZANAFLEX ) 4 MG tablet   Palpitations - Primary EKG NSR She prefers to start with labs rather than get another heart monitor or Echo at this time Will follow-up pending results. Encouraged hydrated, adequate sleep, stress management, minimize caffeine.    Relevant Orders    EKG 12-Lead (Completed)   CBC with Differential/Platelet   Comprehensive metabolic panel with GFR   B12 and Folate Panel   IBC + Ferritin   TSH   H/O cold sores   Relevant Medications   valACYclovir  (VALTREX ) 500 MG tablet   Dyslipidemia   Relevant Orders   Lipid panel   Other Visit Diagnoses       Allergic rhinitis, unspecified seasonality, unspecified trigger       Relevant Medications   cetirizine  (ZYRTEC ) 10 MG tablet  Follow-up: Return for - pending results or sooner if needed.   Waddell FURY Almarie, DNP, FNP-C  I,Emily Lagle,acting as a neurosurgeon for Waddell KATHEE Almarie, NP.,have documented all relevant documentation on the behalf of Waddell KATHEE Almarie, NP,as directed by  Waddell KATHEE Almarie, NP while in the presence of Waddell KATHEE Almarie, NP.   I, Waddell KATHEE Almarie, NP, have reviewed all documentation for this visit. The documentation on 10/26/2024 for the exam, diagnosis, procedures, and orders are all accurate and complete.

## 2024-10-27 LAB — IBC + FERRITIN
Ferritin: 125.6 ng/mL (ref 10.0–291.0)
Iron: 68 ug/dL (ref 42–145)
Saturation Ratios: 21.4 % (ref 20.0–50.0)
TIBC: 317.8 ug/dL (ref 250.0–450.0)
Transferrin: 227 mg/dL (ref 212.0–360.0)

## 2024-10-27 LAB — B12 AND FOLATE PANEL
Folate: 17.1 ng/mL (ref >5.9)
Vitamin B-12: 254 pg/mL (ref 211–911)

## 2024-10-29 ENCOUNTER — Ambulatory Visit: Payer: Self-pay | Admitting: Family Medicine
